# Patient Record
Sex: Male | Born: 1950 | Race: White | Hispanic: No | Marital: Married | Smoking: Never smoker
Health system: Southern US, Community
[De-identification: ages and names within clinical notes are randomized; demographics above are authoritative.]

## PROBLEM LIST (undated history)

## (undated) DIAGNOSIS — N400 Enlarged prostate without lower urinary tract symptoms: Secondary | ICD-10-CM

## (undated) DIAGNOSIS — K5792 Diverticulitis of intestine, part unspecified, without perforation or abscess without bleeding: Secondary | ICD-10-CM

## (undated) DIAGNOSIS — E785 Hyperlipidemia, unspecified: Secondary | ICD-10-CM

## (undated) DIAGNOSIS — I2699 Other pulmonary embolism without acute cor pulmonale: Secondary | ICD-10-CM

## (undated) DIAGNOSIS — F32A Depression, unspecified: Secondary | ICD-10-CM

## (undated) DIAGNOSIS — E039 Hypothyroidism, unspecified: Secondary | ICD-10-CM

## (undated) HISTORY — DX: Diverticulitis of intestine, part unspecified, without perforation or abscess without bleeding: K57.92

## (undated) HISTORY — PX: COLON RESECTION SIGMOID: SHX6737

## (undated) HISTORY — DX: Hyperlipidemia, unspecified: E78.5

---

## 2003-07-16 HISTORY — PX: OTHER SURGICAL HISTORY: SHX169

## 2004-10-18 ENCOUNTER — Encounter: Payer: Self-pay | Admitting: Family Medicine

## 2004-10-18 LAB — CONVERTED CEMR LAB
AST: 17 units/L
HDL: 35 mg/dL
LDL Cholesterol: 117 mg/dL
PSA: 1.6 ng/mL
TSH: 4.34 microintl units/mL

## 2006-10-07 ENCOUNTER — Ambulatory Visit: Payer: Self-pay | Admitting: Family Medicine

## 2006-10-07 DIAGNOSIS — E785 Hyperlipidemia, unspecified: Secondary | ICD-10-CM | POA: Insufficient documentation

## 2006-10-07 DIAGNOSIS — R6882 Decreased libido: Secondary | ICD-10-CM | POA: Insufficient documentation

## 2006-10-07 DIAGNOSIS — L719 Rosacea, unspecified: Secondary | ICD-10-CM | POA: Insufficient documentation

## 2006-10-24 ENCOUNTER — Encounter: Payer: Self-pay | Admitting: Family Medicine

## 2006-10-27 LAB — CONVERTED CEMR LAB
Albumin: 4.3 g/dL (ref 3.5–5.2)
CO2: 25 meq/L (ref 19–32)
Chloride: 104 meq/L (ref 96–112)
Cholesterol: 206 mg/dL — ABNORMAL HIGH (ref 0–200)
Glucose, Bld: 95 mg/dL (ref 70–99)
Potassium: 5 meq/L (ref 3.5–5.3)
Sodium: 142 meq/L (ref 135–145)
Testosterone: 193.77 ng/dL — ABNORMAL LOW (ref 350–890)
Total Protein: 7 g/dL (ref 6.0–8.3)
Triglycerides: 174 mg/dL — ABNORMAL HIGH (ref <150)

## 2006-10-28 ENCOUNTER — Telehealth: Payer: Self-pay | Admitting: Family Medicine

## 2006-11-04 ENCOUNTER — Ambulatory Visit: Payer: Self-pay | Admitting: Family Medicine

## 2006-11-04 DIAGNOSIS — F339 Major depressive disorder, recurrent, unspecified: Secondary | ICD-10-CM | POA: Insufficient documentation

## 2006-11-04 DIAGNOSIS — E291 Testicular hypofunction: Secondary | ICD-10-CM | POA: Insufficient documentation

## 2006-11-04 LAB — CONVERTED CEMR LAB: Hemoglobin: 15.2 g/dL

## 2007-01-06 ENCOUNTER — Ambulatory Visit: Payer: Self-pay | Admitting: Family Medicine

## 2007-02-09 ENCOUNTER — Encounter: Payer: Self-pay | Admitting: Family Medicine

## 2007-02-09 LAB — CONVERTED CEMR LAB
ALT: 21 units/L (ref 0–53)
AST: 19 units/L (ref 0–37)
Cholesterol: 198 mg/dL (ref 0–200)
HDL: 39 mg/dL — ABNORMAL LOW (ref >39)
PSA: 1.94 ng/mL (ref 0.10–4.00)
Sex Hormone Binding: 26 nmol/L (ref 13–71)
Testosterone-% Free: 2.2 % (ref 1.6–2.9)
Testosterone: 255.77 ng/dL — ABNORMAL LOW (ref 350–890)
Total CHOL/HDL Ratio: 5.1
VLDL: 30 mg/dL (ref 0–40)

## 2007-02-11 ENCOUNTER — Telehealth: Payer: Self-pay | Admitting: Family Medicine

## 2007-02-23 ENCOUNTER — Encounter: Payer: Self-pay | Admitting: Family Medicine

## 2007-03-26 ENCOUNTER — Telehealth: Payer: Self-pay | Admitting: Family Medicine

## 2007-03-26 ENCOUNTER — Ambulatory Visit: Payer: Self-pay | Admitting: Family Medicine

## 2007-03-26 DIAGNOSIS — A630 Anogenital (venereal) warts: Secondary | ICD-10-CM | POA: Insufficient documentation

## 2007-06-04 ENCOUNTER — Ambulatory Visit: Payer: Self-pay | Admitting: Family Medicine

## 2007-06-04 DIAGNOSIS — J310 Chronic rhinitis: Secondary | ICD-10-CM | POA: Insufficient documentation

## 2007-06-05 LAB — CONVERTED CEMR LAB
LH: 0.7 milliintl units/mL — ABNORMAL LOW (ref 1.5–9.3)
TSH: 3.027 microintl units/mL (ref 0.350–5.50)
Testosterone: 262.69 ng/dL — ABNORMAL LOW (ref 350–890)

## 2007-07-27 ENCOUNTER — Telehealth (INDEPENDENT_AMBULATORY_CARE_PROVIDER_SITE_OTHER): Payer: Self-pay | Admitting: *Deleted

## 2007-08-03 ENCOUNTER — Telehealth: Payer: Self-pay | Admitting: Family Medicine

## 2007-08-03 ENCOUNTER — Encounter: Payer: Self-pay | Admitting: Family Medicine

## 2007-08-04 ENCOUNTER — Encounter: Payer: Self-pay | Admitting: Family Medicine

## 2007-08-10 ENCOUNTER — Telehealth: Payer: Self-pay | Admitting: Family Medicine

## 2007-08-27 ENCOUNTER — Encounter: Payer: Self-pay | Admitting: Family Medicine

## 2007-08-27 ENCOUNTER — Telehealth: Payer: Self-pay | Admitting: Family Medicine

## 2007-09-25 ENCOUNTER — Encounter: Payer: Self-pay | Admitting: Family Medicine

## 2008-01-01 ENCOUNTER — Telehealth: Payer: Self-pay | Admitting: Family Medicine

## 2008-01-06 ENCOUNTER — Telehealth: Payer: Self-pay | Admitting: Family Medicine

## 2008-01-08 ENCOUNTER — Encounter: Payer: Self-pay | Admitting: Family Medicine

## 2008-01-12 LAB — CONVERTED CEMR LAB
Testosterone-% Free: 3.4 % — ABNORMAL HIGH (ref 1.6–2.9)
Testosterone: 2854.3 ng/dL — ABNORMAL HIGH (ref 350–890)

## 2008-01-21 ENCOUNTER — Telehealth: Payer: Self-pay | Admitting: Family Medicine

## 2008-05-09 ENCOUNTER — Ambulatory Visit: Payer: Self-pay | Admitting: Family Medicine

## 2008-05-09 DIAGNOSIS — L03818 Cellulitis of other sites: Secondary | ICD-10-CM

## 2008-05-09 DIAGNOSIS — L02818 Cutaneous abscess of other sites: Secondary | ICD-10-CM | POA: Insufficient documentation

## 2008-05-09 DIAGNOSIS — J069 Acute upper respiratory infection, unspecified: Secondary | ICD-10-CM | POA: Insufficient documentation

## 2008-05-11 ENCOUNTER — Encounter: Payer: Self-pay | Admitting: Family Medicine

## 2008-05-12 ENCOUNTER — Encounter: Payer: Self-pay | Admitting: Family Medicine

## 2008-05-19 LAB — CONVERTED CEMR LAB
AST: 23 units/L (ref 0–37)
CO2: 25 meq/L (ref 19–32)
Chloride: 102 meq/L (ref 96–112)
Creatinine, Ser: 0.98 mg/dL (ref 0.40–1.50)
HDL: 47 mg/dL (ref >39)
LDL Cholesterol: 141 mg/dL — ABNORMAL HIGH (ref 0–99)
Potassium: 4.8 meq/L (ref 3.5–5.3)
Testosterone-% Free: 2.3 % (ref 1.6–2.9)
Total CHOL/HDL Ratio: 4.6
Triglycerides: 142 mg/dL (ref <150)
VLDL: 28 mg/dL (ref 0–40)

## 2008-10-20 ENCOUNTER — Telehealth: Payer: Self-pay | Admitting: Family Medicine

## 2008-10-21 ENCOUNTER — Encounter: Payer: Self-pay | Admitting: Family Medicine

## 2008-10-24 ENCOUNTER — Telehealth: Payer: Self-pay | Admitting: Family Medicine

## 2008-10-25 LAB — CONVERTED CEMR LAB
AST: 20 units/L (ref 0–37)
HCT: 47.1 % (ref 39.0–52.0)
PSA, Free Pct: 19 — ABNORMAL LOW (ref >25)
PSA, Free: 1.1 ng/mL
PSA: 5.89 ng/mL — ABNORMAL HIGH (ref 0.10–4.00)
Testosterone Free: 78.5 pg/mL (ref 47.0–244.0)
Testosterone-% Free: 2.3 % (ref 1.6–2.9)

## 2008-11-02 ENCOUNTER — Encounter: Payer: Self-pay | Admitting: Family Medicine

## 2009-01-06 ENCOUNTER — Encounter: Payer: Self-pay | Admitting: Family Medicine

## 2009-04-10 ENCOUNTER — Ambulatory Visit: Payer: Self-pay | Admitting: Family Medicine

## 2009-04-10 DIAGNOSIS — R972 Elevated prostate specific antigen [PSA]: Secondary | ICD-10-CM | POA: Insufficient documentation

## 2009-04-11 ENCOUNTER — Encounter: Payer: Self-pay | Admitting: Family Medicine

## 2009-04-11 LAB — CONVERTED CEMR LAB
AST: 22 units/L (ref 0–37)
Albumin: 4.1 g/dL (ref 3.5–5.2)
Alkaline Phosphatase: 71 units/L (ref 39–117)
BUN: 14 mg/dL (ref 6–23)
Calcium: 9 mg/dL (ref 8.4–10.5)
Chloride: 106 meq/L (ref 96–112)
Glucose, Bld: 121 mg/dL — ABNORMAL HIGH (ref 70–99)
Hemoglobin: 14.7 g/dL (ref 13.0–17.0)
MCHC: 33.1 g/dL (ref 30.0–36.0)
PSA, Free Pct: 19 — ABNORMAL LOW (ref >25)
PSA, Free: 0.6 ng/mL
PSA: 3.12 ng/mL (ref 0.10–4.00)
Potassium: 4.2 meq/L (ref 3.5–5.3)
RBC: 4.99 M/uL (ref 4.22–5.81)
Sodium: 143 meq/L (ref 135–145)
Total Protein: 6.5 g/dL (ref 6.0–8.3)
WBC: 6.4 10*3/uL (ref 4.0–10.5)

## 2009-04-18 ENCOUNTER — Encounter: Payer: Self-pay | Admitting: Family Medicine

## 2009-06-05 ENCOUNTER — Encounter: Payer: Self-pay | Admitting: Family Medicine

## 2009-06-06 ENCOUNTER — Encounter: Payer: Self-pay | Admitting: Family Medicine

## 2009-06-15 LAB — CONVERTED CEMR LAB
Albumin: 4.1 g/dL (ref 3.5–5.2)
Bilirubin, Direct: 0.1 mg/dL (ref 0.0–0.3)
HCT: 45 % (ref 39.0–52.0)
PSA, Free: 0.6 ng/mL
PSA: 2.45 ng/mL (ref 0.10–4.00)
Sex Hormone Binding: 23 nmol/L (ref 13–71)
Testosterone: 268.6 ng/dL — ABNORMAL LOW (ref 350–890)
Total Bilirubin: 0.6 mg/dL (ref 0.3–1.2)

## 2009-07-13 ENCOUNTER — Ambulatory Visit: Payer: Self-pay | Admitting: Family Medicine

## 2009-07-13 DIAGNOSIS — L03019 Cellulitis of unspecified finger: Secondary | ICD-10-CM | POA: Insufficient documentation

## 2009-07-13 DIAGNOSIS — R21 Rash and other nonspecific skin eruption: Secondary | ICD-10-CM | POA: Insufficient documentation

## 2009-07-24 ENCOUNTER — Ambulatory Visit: Payer: Self-pay | Admitting: Family Medicine

## 2009-07-24 DIAGNOSIS — R7301 Impaired fasting glucose: Secondary | ICD-10-CM | POA: Insufficient documentation

## 2009-07-24 LAB — CONVERTED CEMR LAB: Blood Glucose, AC Bkfst: 106 mg/dL

## 2009-08-31 ENCOUNTER — Telehealth: Payer: Self-pay | Admitting: Family Medicine

## 2009-10-04 ENCOUNTER — Telehealth: Payer: Self-pay | Admitting: Family Medicine

## 2009-10-04 DIAGNOSIS — I868 Varicose veins of other specified sites: Secondary | ICD-10-CM | POA: Insufficient documentation

## 2009-11-06 ENCOUNTER — Encounter: Payer: Self-pay | Admitting: Family Medicine

## 2009-11-28 ENCOUNTER — Ambulatory Visit: Payer: Self-pay | Admitting: Family Medicine

## 2009-12-28 ENCOUNTER — Encounter: Payer: Self-pay | Admitting: Family Medicine

## 2010-02-21 ENCOUNTER — Encounter: Payer: Self-pay | Admitting: Family Medicine

## 2010-02-22 LAB — CONVERTED CEMR LAB
HDL: 35 mg/dL — ABNORMAL LOW (ref >39)
Total CHOL/HDL Ratio: 6
Triglycerides: 226 mg/dL — ABNORMAL HIGH (ref <150)

## 2010-03-02 ENCOUNTER — Telehealth: Payer: Self-pay | Admitting: Family Medicine

## 2010-03-14 ENCOUNTER — Ambulatory Visit: Payer: Self-pay | Admitting: Family Medicine

## 2010-03-14 DIAGNOSIS — R197 Diarrhea, unspecified: Secondary | ICD-10-CM | POA: Insufficient documentation

## 2010-03-14 DIAGNOSIS — R1084 Generalized abdominal pain: Secondary | ICD-10-CM | POA: Insufficient documentation

## 2010-03-15 ENCOUNTER — Encounter: Admission: RE | Admit: 2010-03-15 | Discharge: 2010-03-15 | Payer: Self-pay | Admitting: Family Medicine

## 2010-03-15 LAB — CONVERTED CEMR LAB
ALT: 27 units/L (ref 0–53)
AST: 25 units/L (ref 0–37)
Alkaline Phosphatase: 69 units/L (ref 39–117)
Basophils Absolute: 0 10*3/uL (ref 0.0–0.1)
Basophils Relative: 0 % (ref 0–1)
CO2: 29 meq/L (ref 19–32)
Creatinine, Ser: 1.01 mg/dL (ref 0.40–1.50)
Eosinophils Absolute: 0.1 10*3/uL (ref 0.0–0.7)
Eosinophils Relative: 1 % (ref 0–5)
HCT: 50.4 % (ref 39.0–52.0)
Hemoglobin: 16.4 g/dL (ref 13.0–17.0)
LDH: 177 units/L (ref 94–250)
MCHC: 32.5 g/dL (ref 30.0–36.0)
MCV: 91.3 fL (ref 78.0–100.0)
Monocytes Absolute: 0.7 10*3/uL (ref 0.1–1.0)
Platelets: 258 10*3/uL (ref 150–400)
RDW: 13.8 % (ref 11.5–15.5)
Total Bilirubin: 0.6 mg/dL (ref 0.3–1.2)

## 2010-03-21 ENCOUNTER — Encounter: Payer: Self-pay | Admitting: Family Medicine

## 2010-03-29 ENCOUNTER — Encounter: Payer: Self-pay | Admitting: Family Medicine

## 2010-05-16 ENCOUNTER — Encounter: Payer: Self-pay | Admitting: Family Medicine

## 2010-05-31 ENCOUNTER — Ambulatory Visit: Payer: Self-pay | Admitting: Psychology

## 2010-07-25 ENCOUNTER — Ambulatory Visit: Admit: 2010-07-25 | Payer: Self-pay | Admitting: Psychology

## 2010-08-14 NOTE — Assessment & Plan Note (Signed)
Summary: f/u depression   Vital Signs:  Patient profile:   60 year old male Height:      74 inches Weight:      232 pounds BMI:     29.89 O2 Sat:      99 % on Room air Pulse rate:   85 / minute BP sitting:   110 / 76  (left arm) Cuff size:   small  Vitals Entered By: Marc Lowe CMA (Nov 28, 2009 8:11 AM)  O2 Flow:  Room air CC: F/U mood. Doing well.   Primary Care Provider:  Seymour Lowe D.O.  CC:  F/U mood. Doing well.[name] Kitchen  History of Present Illness: [name] presents for f/u depression, on Effexor XR 150 mg/ day.  Going thru a lot of stressors at home with 60 elderly father's failing health and his twin daughters moving back home.  He is off his testosterone b/c it 'wasn't helping' his symptoms.    Denies insomnia, panic attacks or suicidal thoughts.  His wife is supportive.  He wants to see a therapist.  He feels more tired than usual.  His Effexor has helped some and he wants to stay on it.    Current Medications (verified): 1)  Effexor Xr 150 Mg Xr24h-Cap (Venlafaxine Hcl) .... Take 1 Tablet By Mouth Once A Day 2)  Testosterone 25% Cream .... Apply 1/2 Ml To Inner Thighs Twice Daily 3)  Tetracycline Hcl 500 Mg Caps (Tetracycline Hcl) .[name] Kitchen.. 1 Capsule By Mouth Daily W/ Food  Allergies (verified): No Known Drug Allergies  Past History:  Past Medical History: diverticulitiis- perf lead to colon resection in 2005 acne rosace hx of cellulitis of the abdominal wall hypogonadism depression  Past Surgical History: Reviewed history from 10/07/2006 and no changes required. achilles tendon repair, left lasik surgery colon resection 2005 (w/ E. Coli infection) colosctomy 2006 with reversal ventral hernia repair of incision, 2007 x 2 uvuloplasty  Social History: Reviewed history from 10/07/2006 and no changes required. 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  Has 4 grown kids, youngest at home.  Never smoked.  Walks 2 x a wk.  Had vasectomy.  2 cups coffee  daily.  2 ETOH drinks per wk.    Review of Systems Psych:  Complains of depression; denies anxiety, irritability, panic attacks, suicidal thoughts/plans, thoughts of violence, unusual visions or sounds, and thoughts /plans of harming others.  Physical Exam  General:  alert, well-developed, well-nourished, and well-hydrated.   Head:  normocephalic and atraumatic.   Mouth:  good dentition and pharynx pink and moist.   Neck:  no masses.   Lungs:  Normal respiratory effort, chest expands symmetrically. Lungs are clear to auscultation, no crackles or wheezes. Heart:  Normal rate and regular rhythm. S1 and S2 normal without gallop, murmur, click, rub or other extra sounds. Extremities:  varicose veins in legs Skin:  color normal.   Cervical Nodes:  No lymphadenopathy noted Psych:  good eye contact, not anxious appearing, and flat affect.     Impression & Recommendations:  Problem # 1:  DISORDER, DEPRESSIVE NEC (ICD-311) He has many life stressors now and has done fair with Effexor.  He is not having panic attacks, insomnia, suicidial thoughts to warrant adding a mood stabilizer or benzos.  I will add a counseling referral with Dr Dellia Cloud.   His updated medication list for this problem includes:    Effexor Xr 150 Mg Xr24h-cap (Venlafaxine hcl) .[name] Kitchen... Take 1 tablet by mouth once a day  Orders: Psychology Referral (Psychology)  Problem # 2:  HYPOGONADISM, MALE (ICD-257.2) He is off testosterone replacement since he was not seeing clinical improvement, which may be more to his depression. I will get him into the psychologist. Orders: Psychology Referral (Psychology)  Problem # 3:  HYPERLIPIDEMIA (ICD-272.4) Update FLP in June.  He has just started the 'belly fat' diet and wants to try this for a month. 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 He has f/u with them for laser treatment.  Complete Medication List: 1)  Effexor Xr 150 Mg Xr24h-cap (Venlafaxine hcl) .... Take 1 tablet by mouth once a day 2)  Tetracycline Hcl 500 Mg Caps (Tetracycline hcl) .[name] Kitchen.. 1 capsule by mouth daily w/ food  Other Orders: T-Glucose, Blood ([id number])  Patient Instructions: 1)  Update fasting labs in June. 2)  Will call you w/ results. 3)  Stay on Effexor. 4)  Referral made to psychologist - Dr Dellia Cloud. 5)  REturn for a PHYSICAL in 4 mos. Prescriptions: EFFEXOR XR 150 MG XR24H-CAP (VENLAFAXINE HCL) Take 1 tablet by mouth once a day  #30 x 6   Entered and Authorized by:   Marc Bars DO   Signed by:   Marc Bars DO on 11/28/2009   Method used:   Electronically to        [facility]  [id number] (retail)       [address]       [place], [place]  [address]       Ph: [phone]       Fax: [phone]   RxID:   [id number]

## 2010-08-14 NOTE — Letter (Signed)
Summary: [facility]  [facility]   Imported By: Lanelle Bal 05/30/2010 12:14:20  _____________________________________________________________________  External Attachment:    Type:   Image     Comment:   External Document

## 2010-08-14 NOTE — Letter (Signed)
Summary: [facility]  [facility]   Imported By: Lanelle Bal 01/12/2010 10:11:29  _____________________________________________________________________  External Attachment:    Type:   Image     Comment:   External Document

## 2010-08-14 NOTE — Progress Notes (Signed)
Summary: Finger no better  Phone Note Call from Patient Call back at Home Phone [phone]   Caller: Patient Call For: Seymour Bars DO Summary of Call: pt calls and states his finger is no better and wonders what to do. Please advise Initial call taken by: Kathlene November,  August 31, 2009 11:00 AM  Follow-up for Phone Call        Lets see if we can get him in with [facility] in [place] today or tomorrow for finger abscess. Follow-up by: Seymour Bars DO,  August 31, 2009 11:21 AM     Appended Document: Finger no better   Appended Document: Finger no better pt aware

## 2010-08-14 NOTE — Consult Note (Signed)
Summary: [facility]  [facility]   Imported By: Lanelle Bal 03/29/2010 11:50:32  _____________________________________________________________________  External Attachment:    Type:   Image     Comment:   External Document

## 2010-08-14 NOTE — Assessment & Plan Note (Signed)
Summary: abdominal pain   Vital Signs:  Patient profile:   60 year old male Height:      74 inches Weight:      232 pounds BMI:     29.89 O2 Sat:      99 % on Room air Temp:     98.5 degrees F oral Pulse rate:   85 / minute BP sitting:   108 / 71  (left arm) Cuff size:   large  Vitals Entered By: Payton Spark CMA (March 14, 2010 11:20 AM)  O2 Flow:  Room air CC: Stomach ache x 2 days. Has had episodes like this in the past.    Primary Care Provider:  Seymour Bars D.O.  CC:  Stomach ache x 2 days. Has had episodes like this in the past. .  History of Present Illness: [name] presents for diffuse abdominal pain that began about a wk ago.  He feels bloated.  Denies any sharp pain.  His abd pain woke him up from sleep.  Took Pepto in the middle  of the night.    Over the past month, he has had more abdominal pain that usual.  He took Crestor for the first time a wk ago and had N/V for 1 night.  Denies any constipation.  He is getting loose stools.  Denies blood in the stool.  Pain is usually worse after eating.  He has been eating healthy and smaller portions.    He has a hx of colon resection in 05 for diverticulitis.  He had his last colonoscopy in 06 and is due for f/u.       Current Medications (verified): 1)  Effexor Xr 150 Mg Xr24h-Cap (Venlafaxine Hcl) .... Take 1 Tablet By Mouth Once A Day 2)  Tetracycline Hcl 500 Mg Caps (Tetracycline Hcl) .[name] Kitchen.. 1 Capsule By Mouth Daily W/ Food 3)  Zetia 10 Mg Tabs (Ezetimibe) .[name] Kitchen.. 1 Tab By Mouth Daily  Allergies (verified): 1)  ! Crestor (Rosuvastatin Calcium)  Past History:  Past Medical History: Reviewed history from 11/28/2009 and no changes required. diverticulitiis- perf lead to colon resection in 2005 acne rosace hx of cellulitis of the abdominal wall hypogonadism depression  Past Surgical History: Reviewed history from 10/07/2006 and no changes required. achilles tendon repair, left lasik surgery colon resection  2005 (w/ E. Coli infection) colosctomy 2006 with reversal ventral hernia repair of incision, 2007 x 2 uvuloplasty  Social History: Reviewed history from 10/07/2006 and no changes required. 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  Has 4 grown kids, youngest at home.  Never smoked.  Walks 2 x a wk.  Had vasectomy.  2 cups coffee daily.  2 ETOH drinks per wk.    Review of Systems General:  Complains of loss of appetite; denies fatigue, fever, weakness, and weight loss. CV:  Denies chest pain or discomfort. GI:  Complains of abdominal pain, change in bowel habits, diarrhea, gas, indigestion, and nausea; denies bloody stools, constipation, dark tarry stools, and vomiting.  Physical Exam  General:  alert, well-developed, well-nourished, and well-hydrated.   Head:  normocephalic and atraumatic.   Eyes:  sclera non icteric Mouth:  pharynx pink and moist.   Neck:  no masses.   Lungs:  Normal respiratory effort, chest expands symmetrically. Lungs are clear to auscultation, no crackles or wheezes. Heart:  Normal rate and regular rhythm. S1 and S2 normal without gallop, murmur, click, rub or other extra sounds. Abdomen:  diffusely TTP with mild  distenstion.  NO HSM.  No rigidity.  hyperactive BS. Extremities:  no LE edema Skin:  no rash, jaundice or pallor Psych:  good eye contact, not anxious appearing, and not depressed appearing.     Impression & Recommendations:  Problem # 1:  ABDOMINAL PAIN, GENERALIZED (ICD-789.07) Will get labs and a CT scan today.  Concern given hx of partial colectomy for obstruction.  F/U result tomorrow.  Clear liquid diet.  Phenergan with Codeine as needed symptom releif.   Orders: T-CT Abdomen/pelvis w ([id number]) T-CBC w/Diff [phone]) T-Comprehensive Metabolic Panel [phone]) T-Amylase [id number]) T-Lipase [id number]) T-LDH [phone]) Gastroenterology Referral (GI)  Problem # 2:  DIARRHEA (ICD-787.91) As per above.  5 yrs since last  colonoscopy.  Will need repeat.   Orders: T-CT Abdomen/pelvis w ([id number])  Complete Medication List: 1)  Effexor Xr 150 Mg Xr24h-cap (Venlafaxine hcl) .... Take 1 tablet by mouth once a day 2)  Tetracycline Hcl 500 Mg Caps (Tetracycline hcl) .[name] Kitchen.. 1 capsule by mouth daily w/ food 3)  Zetia 10 Mg Tabs (Ezetimibe) .[name] Kitchen.. 1 tab by mouth daily 4)  Promethazine-codeine 6.25-10 Mg/30ml Syrp (Promethazine-codeine) .... 5 ml by mouth at bedtime as needed nausea/ abd pain  Patient Instructions: 1)  CT today. 2)  Labs today. 3)  Will call you with all results tomorrow. 4)  Clear liquid diet today. 5)  Will get you in with GI. 6)  Use Phenergan with codeine as needed for pain/ nausea-- caution as this will cause sedation and constipation (you can take every 6 hrs if at home and not needing to drive) Prescriptions: PROMETHAZINE-CODEINE 6.25-10 MG/5ML SYRP (PROMETHAZINE-CODEINE) 5 ml by mouth at bedtime as needed nausea/ abd pain  #120 ml x 0   Entered and Authorized by:   Seymour Bars DO   Signed by:   Seymour Bars DO on 03/14/2010   Method used:   Printed then faxed to ...       [facility]  [address] [phone]* (retail)       [address]       [place], [place]  [address]       Ph: [phone]       Fax: [phone]   RxID:   [id number]

## 2010-08-14 NOTE — Progress Notes (Signed)
Summary: Referral  Phone Note Call from Patient   Caller: Patient Summary of Call: Pt would like referral to vein specialist as discussed at last OV. Please advise. Initial call taken by: Payton Spark CMA,  October 04, 2009 4:21 PM  Follow-up for Phone Call        for varicose veins? Follow-up by: Seymour Bars DO,  October 04, 2009 4:27 PM  Additional Follow-up for Phone Call Additional follow up Details #1::        Yes. He said for ? vein removal. 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  Seymour Bars, D.O.

## 2010-08-14 NOTE — Letter (Signed)
Summary: Letter with Liver MRI Results/[facility]  Letter with Liver MRI Results/[facility]   Imported By: Lanelle Bal 04/10/2010 11:18:21  _____________________________________________________________________  External Attachment:    Type:   Image     Comment:   External Document

## 2010-08-14 NOTE — Consult Note (Signed)
Summary: [facility]  [facility]   Imported By: Lanelle Bal 11/23/2009 12:54:25  _____________________________________________________________________  External Attachment:    Type:   Image     Comment:   External Document

## 2010-08-14 NOTE — Progress Notes (Signed)
Summary: Crestor SE  Phone Note Call from Patient   Caller: Patient Summary of Call: Pt states he has tried the Crestor but it caused vomiting and diarrhea. Pt would like to have something different called in.  Initial call taken by: Payton Spark CMA,  March 02, 2010 1:00 PM  Follow-up for Phone Call        I changed him to a different class of cholesterol meds - zetia, once a day.  stop crestor.  Call if any problems.  recheck FLP in 8 wks. Follow-up by: Seymour Bars DO,  March 02, 2010 1:22 PM   New Allergies: ! CRESTOR (ROSUVASTATIN CALCIUM) New/Updated Medications: ZETIA 10 MG TABS (EZETIMIBE) 1 tab by mouth daily New Allergies: ! CRESTOR (ROSUVASTATIN CALCIUM)Prescriptions: ZETIA 10 MG TABS (EZETIMIBE) 1 tab by mouth daily  #30 x 2   Entered and Authorized by:   Seymour Bars DO   Signed by:   Seymour Bars DO on 03/02/2010   Method used:   Electronically to        [facility]  [id number] (retail)       [address]       [place], [place]  [address]       Ph: [phone]       Fax: [phone]   RxID:   [id number]   Appended Document: Crestor SE Pt aware of the above

## 2010-08-14 NOTE — Assessment & Plan Note (Signed)
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 Keep working on [employer], regular exercise.  Monitor q  6 mos. Orders: Fingerstick (36416) Glucose, (CBG) (60454)  Complete Medication List: 1)  Effexor Xr 150 Mg Xr24h-cap (Venlafaxine hcl) .... Take 1 tablet by mouth once a day 2)  Testosterone 25% Cream  .... Apply 1/2 ml to inner thighs twice daily 3)  Tetracycline Hcl 500 Mg Caps (Tetracycline hcl) .[name] Kitchen.. 1 capsule by mouth daily w/ food 4)  Triamcinolone Acetonide 0.5 % Oint (Triamcinolone acetonide) .... Apply ointment to the r food two times a day x 10 days 5)  Cephalexin 500 Mg Caps (Cephalexin) .[name] Kitchen.. 1 capsule by mouth three times a day x 7 days

## 2010-08-16 ENCOUNTER — Encounter: Payer: Self-pay | Admitting: Family Medicine

## 2010-09-11 NOTE — Letter (Signed)
Summary: [facility]  [facility]   Imported By: Maryln Gottron 09/03/2010 15:09:35  _____________________________________________________________________  External Attachment:    Type:   Image     Comment:   External Document

## 2010-10-29 ENCOUNTER — Telehealth: Payer: Self-pay | Admitting: Family Medicine

## 2010-10-29 MED ORDER — EZETIMIBE 10 MG PO TABS: 10.0000 mg | ORAL_TABLET | Freq: Every day | ORAL | Status: DC

## 2010-10-29 MED ORDER — VENLAFAXINE HCL ER 150 MG PO CP24: 150.0000 mg | ORAL_CAPSULE | Freq: Every day | ORAL | Status: DC

## 2010-10-29 NOTE — Telephone Encounter (Signed)
Needs refill on Rx.  Please call him at [phone]

## 2011-02-12 ENCOUNTER — Other Ambulatory Visit: Payer: Self-pay | Admitting: Family Medicine

## 2012-11-13 ENCOUNTER — Ambulatory Visit: Payer: Self-pay | Admitting: Family Medicine

## 2014-04-14 ENCOUNTER — Encounter: Payer: Self-pay | Admitting: Family Medicine

## 2014-04-14 ENCOUNTER — Ambulatory Visit (INDEPENDENT_AMBULATORY_CARE_PROVIDER_SITE_OTHER): Payer: BC Managed Care – PPO | Admitting: Family Medicine

## 2014-04-14 VITALS — BP 122/81 | HR 75 | Ht 74.0 in | Wt 230.0 lb

## 2014-04-14 DIAGNOSIS — Z23 Encounter for immunization: Secondary | ICD-10-CM

## 2014-04-14 DIAGNOSIS — Z0000 Encounter for general adult medical examination without abnormal findings: Secondary | ICD-10-CM

## 2014-04-14 DIAGNOSIS — Z9049 Acquired absence of other specified parts of digestive tract: Secondary | ICD-10-CM | POA: Insufficient documentation

## 2014-04-14 DIAGNOSIS — E291 Testicular hypofunction: Secondary | ICD-10-CM | POA: Diagnosis not present

## 2014-04-14 DIAGNOSIS — R972 Elevated prostate specific antigen [PSA]: Secondary | ICD-10-CM

## 2014-04-14 DIAGNOSIS — E349 Endocrine disorder, unspecified: Secondary | ICD-10-CM | POA: Insufficient documentation

## 2014-04-14 DIAGNOSIS — Z9289 Personal history of other medical treatment: Secondary | ICD-10-CM | POA: Diagnosis not present

## 2014-04-14 LAB — COMPLETE METABOLIC PANEL WITH GFR
ALBUMIN: 4.3 g/dL (ref 3.5–5.2)
ALK PHOS: 59 U/L (ref 39–117)
ALT: 21 U/L (ref 0–53)
AST: 18 U/L (ref 0–37)
BILIRUBIN TOTAL: 0.8 mg/dL (ref 0.2–1.2)
BUN: 13 mg/dL (ref 6–23)
CO2: 28 mEq/L (ref 19–32)
Calcium: 9.5 mg/dL (ref 8.4–10.5)
Chloride: 104 mEq/L (ref 96–112)
Creat: 1.03 mg/dL (ref 0.50–1.35)
GFR, EST NON AFRICAN AMERICAN: 78 mL/min
GFR, Est African American: >89 mL/min
GLUCOSE: 90 mg/dL (ref 70–99)
POTASSIUM: 4.6 meq/L (ref 3.5–5.3)
SODIUM: 138 meq/L (ref 135–145)
TOTAL PROTEIN: 6.7 g/dL (ref 6.0–8.3)

## 2014-04-14 LAB — CBC
HEMATOCRIT: 50.1 % (ref 39.0–52.0)
HEMOGLOBIN: 17.3 g/dL — AB (ref 13.0–17.0)
MCH: 29.7 pg (ref 26.0–34.0)
MCHC: 34.5 g/dL (ref 30.0–36.0)
MCV: 86.1 fL (ref 78.0–100.0)
Platelets: 238 10*3/uL (ref 150–400)
RBC: 5.82 MIL/uL — ABNORMAL HIGH (ref 4.22–5.81)
RDW: 13.8 % (ref 11.5–15.5)
WBC: 5.8 10*3/uL (ref 4.0–10.5)

## 2014-04-14 LAB — LIPID PANEL
CHOL/HDL RATIO: 5.8 ratio
Cholesterol: 226 mg/dL — ABNORMAL HIGH (ref 0–200)
HDL: 39 mg/dL — ABNORMAL LOW (ref >39)
LDL CALC: 146 mg/dL — AB (ref 0–99)
Triglycerides: 207 mg/dL — ABNORMAL HIGH (ref <150)
VLDL: 41 mg/dL — ABNORMAL HIGH (ref 0–40)

## 2014-04-14 MED ORDER — AMBULATORY NON FORMULARY MEDICATION: Status: DC

## 2014-04-14 NOTE — Progress Notes (Signed)
CC: Marc Lowe is a 63 y.o. male is here for Establish Care   Subjective: HPI:  Colonoscopy: Unsure about date but believes it was in the last 3-5 years. I've asked him to provide me with the name of the clinic that most recently provided him with a colonoscopy to determine if he needs a repeat for colon cancer screening purposes Prostate: Discussed screening risks/beneifts with patient today, he has a history of elevated PSA but he is not sure just how high this was. He's currently on testosterone therapy and has a urologist to follow as his BPH and elevated PSA  Influenza Vaccine: Declined today Pneumovax: No current indication Td/Tdap: Needs Tdap today Zoster: Will receive today  Pleasant 63 year old here to reestablish care with no acute complaints  Review of Systems - General ROS: negative for - chills, fever, night sweats, weight gain or weight loss Ophthalmic ROS: negative for - decreased vision Psychological ROS: negative for - anxiety or depression ENT ROS: negative for - hearing change, nasal congestion, tinnitus or allergies Hematological and Lymphatic ROS: negative for - bleeding problems, bruising or swollen lymph nodes Breast ROS: negative Respiratory ROS: no cough, shortness of breath, or wheezing Cardiovascular ROS: no chest pain or dyspnea on exertion Gastrointestinal ROS: no abdominal pain, change in bowel habits, or black or bloody stools Genito-Urinary ROS: negative for - genital discharge, genital ulcers, incontinence or abnormal bleeding from genitals Musculoskeletal ROS: negative for - joint pain or muscle pain Neurological ROS: negative for - headaches or memory loss Dermatological ROS: negative for lumps, mole changes, rash and skin lesion changes  Past Medical History  Diagnosis Date  . Diverticulitis     History reviewed. No pertinent past surgical history. History reviewed. No pertinent family history.  History   Social History  . Marital  Status: Married    Spouse Name: N/A    Number of Children: N/A  . Years of Education: N/A   Occupational History  . Not on file.   Social History Main Topics  . Smoking status: Never Smoker   . Smokeless tobacco: Not on file  . Alcohol Use: Not on file  . Drug Use: No  . Sexual Activity: Yes    Partners: Female   Other Topics Concern  . Not on file   Social History Narrative  . No narrative on file     Objective: BP 122/81  Pulse 75  Ht 6\' 2"  (1.88 m)  Wt 230 lb (104.327 kg)  BMI 29.52 kg/m2  General: No Acute Distress HEENT: Atraumatic, normocephalic, conjunctivae normal without scleral icterus.  No nasal discharge, hearing grossly intact, TMs with good landmarks bilaterally with no middle ear abnormalities, posterior pharynx clear without oral lesions. Neck: Supple, trachea midline, no cervical nor supraclavicular adenopathy. Pulmonary: Clear to auscultation bilaterally without wheezing, rhonchi, nor rales. Cardiac: Regular rate and rhythm.  No murmurs, rubs, nor gallops. No peripheral edema.  2+ peripheral pulses bilaterally. Abdomen: Bowel sounds normal.  No masses.  Non-tender without rebound.  Negative Murphy's sign. MSK: Grossly intact, no signs of weakness.  Full strength throughout upper and lower extremities.  Full ROM in upper and lower extremities.  No midline spinal tenderness. Neuro: Gait unremarkable, CN II-XII grossly intact.  C5-C6 Reflex 2/4 Bilaterally, L4 Reflex 2/4 Bilaterally.  Cerebellar function intact. Skin: No rashes. Psych: Alert and oriented to person/place/time.  Thought process normal. No anxiety/depression.  Assessment & Plan: Marc Lowe was seen today for establish care.  Diagnoses and associated orders for this  visit:  Annual physical exam - Lipid panel - COMPLETE METABOLIC PANEL WITH GFR - CBC - Tdap vaccine greater than or equal to 7yo IM - Varicella-zoster vaccine subcutaneous  History of bowel resection  Testosterone  deficiency  ELEVATED PROSTATE SPECIFIC ANTIGEN  Other Orders - AMBULATORY NON FORMULARY MEDICATION; Topical Testosterone    Healthy lifestyle interventions including but not limited to regular exercise, a healthy low fat diet, moderation of salt intake, the dangers of tobacco/alcohol/recreational drug use, nutrition supplementation, and accident avoidance were discussed with the patient and a handout was provided for future reference.  Followup will be based on the results of the above results   Return if symptoms worsen or fail to improve.

## 2014-04-14 NOTE — Patient Instructions (Addendum)
Please let me know where you had your most recent colonoscopy so I can request your old records.     Dr. Lajoyce Lauber General Advice Following Your Complete Physical Exam  The Benefits of Regular Exercise: Unless you suffer from an uncontrolled cardiovascular condition, studies strongly suggest that regular exercise and physical activity will add to both the quality and length of your life.  The World Health Organization recommends 150 minutes of moderate intensity aerobic activity every week.  This is best split over 3-4 days a week, and can be as simple as a brisk walk for just over 35 minutes "most days of the week".  This type of exercise has been shown to lower LDL-Cholesterol, lower average blood sugars, lower blood pressure, lower cardiovascular disease risk, improve memory, and increase one's overall sense of wellbeing.  The addition of anaerobic (or "strength training") exercises offers additional benefits including but not limited to increased metabolism, prevention of osteoporosis, and improved overall cholesterol levels.  How Can I Strive For A Low-Fat Diet?: Current guidelines recommend that 25-35 percent of your daily energy (food) intake should come from fats.  One might ask how can this be achieved without having to dissect each meal on a daily basis?  Switch to skim or 1% milk instead of whole milk.  Focus on lean meats such as ground [place], fresh fish, baked chicken, and lean cuts of beef as your source of dietary protein.  Limit saturated fat consumption to less than 10% of your daily caloric intake.  Limit trans fatty acid consumption primarily by limiting synthetic trans fats such as partially hydrogenated oils (Ex: fried fast foods).  Substitute olive or vegetable oil for solid fats where possible.  Moderation of Salt Intake: Provided you don't carry a diagnosis of congestive heart failure nor renal failure, I recommend a daily allowance of no more than 2300 mg of salt  (sodium).  Keeping under this daily goal is associated with a decreased risk of cardiovascular events, creeping above it can lead to elevated blood pressures and increases your risk of cardiovascular events.  Milligrams (mg) of salt is listed on all nutrition labels, and your daily intake can add up faster than you think.  Most canned and frozen dinners can pack in over half your daily salt allowance in one meal.    Lifestyle Health Risks: Certain lifestyle choices carry specific health risks.  As you may already know, tobacco use has been associated with increasing one's risk of cardiovascular disease, pulmonary disease, numerous cancers, among many other issues.  What you may not know is that there are medications and nicotine replacement strategies that can more than double your chances of successfully quitting.  I would be thrilled to help manage your quitting strategy if you currently use tobacco products.  When it comes to alcohol use, I've yet to find an "ideal" daily allowance.  Provided an individual does not have a medical condition that is exacerbated by alcohol consumption, general guidelines determine "safe drinking" as no more than two standard drinks for a man or no more than one standard drink for a male per day.  However, much debate still exists on whether any amount of alcohol consumption is technically "safe".  My general advice, keep alcohol consumption to a minimum for general health promotion.  If you or others believe that alcohol, tobacco, or recreational drug use is interfering with your life, I would be happy to provide confidential counseling regarding treatment options.  General "Over The Counter" Nutrition  Advice: Postmenopausal women should aim for a daily calcium intake of 1200 mg, however a significant portion of this might already be provided by diets including milk, yogurt, cheese, and other dairy products.  Vitamin D has been shown to help preserve bone density, prevent  fatigue, and has even been shown to help reduce falls in the elderly.  Ensuring a daily intake of 800 Units of Vitamin D is a good place to start to enjoy the above benefits, we can easily check your Vitamin D level to see if you'd potentially benefit from supplementation beyond 800 Units a day.  Folic Acid intake should be of particular concern to women of childbearing age.  Daily consumption of 867-544 mcg of Folic Acid is recommended to minimize the chance of spinal cord defects in a fetus should pregnancy occur.    For many adults, accidents still remain one of the most common culprits when it comes to cause of death.  Some of the simplest but most effective preventitive habits you can adopt include regular seatbelt use, proper helmet use, securing firearms, and regularly testing your smoke and carbon monoxide detectors.  Ikey Omary B. [place] [address], [address], [place] [address] Phone: [phone]

## 2014-04-15 ENCOUNTER — Encounter: Payer: Self-pay | Admitting: Family Medicine

## 2014-04-15 ENCOUNTER — Telehealth: Payer: Self-pay | Admitting: Family Medicine

## 2014-04-15 DIAGNOSIS — E785 Hyperlipidemia, unspecified: Secondary | ICD-10-CM | POA: Insufficient documentation

## 2014-04-15 MED ORDER — ATORVASTATIN CALCIUM 20 MG PO TABS: 20.0000 mg | ORAL_TABLET | Freq: Every day | ORAL | Status: DC

## 2014-04-15 NOTE — Telephone Encounter (Signed)
Pt.notified

## 2014-04-15 NOTE — Telephone Encounter (Signed)
Marc Lowe, Will you please let patient know that his triglycerides and cholesterol were elevated to a degree where I would recommend starting on a cholesterol lowering medication to reduce his risk of a heart attack or stroke. I've sent atorvastatin to his [facility] on file.  Kidney, liver function, and blood sugar were normal.  His hemoglobin level was elevated above the normal range which is likely due to his testosterone supplementation.  I would recommend that he share this news with whoever is prescribing his supplementation since the dose may need to be lowered. F/U with me in 3 months for cholesterol.

## 2014-09-30 ENCOUNTER — Ambulatory Visit (INDEPENDENT_AMBULATORY_CARE_PROVIDER_SITE_OTHER): Payer: BC Managed Care – PPO | Admitting: Family Medicine

## 2014-09-30 ENCOUNTER — Encounter: Payer: Self-pay | Admitting: Family Medicine

## 2014-09-30 VITALS — BP 123/86 | HR 96 | Temp 98.1°F | Wt 220.0 lb

## 2014-09-30 DIAGNOSIS — M25512 Pain in left shoulder: Secondary | ICD-10-CM | POA: Diagnosis not present

## 2014-09-30 DIAGNOSIS — R05 Cough: Secondary | ICD-10-CM

## 2014-09-30 DIAGNOSIS — R059 Cough, unspecified: Secondary | ICD-10-CM

## 2014-09-30 MED ORDER — HYDROCODONE-HOMATROPINE 5-1.5 MG/5ML PO SYRP: 5.0000 mL | ORAL_SOLUTION | Freq: Three times a day (TID) | ORAL | Status: DC | PRN

## 2014-09-30 NOTE — Progress Notes (Signed)
CC: Marc Lowe is a 64 y.o. male is here for Fatigue   Subjective: HPI:  Complains of cough nonproductive that has been present for the past 5 days. It came on acutely and abruptly and has been severe in severity however as of this morning is only mild in severity. It was accompanied by fever of 101.0, chills, fatigue, lack of appetite all of which was moderate to severe in severity but again improved drastically this morning and resolved other than the cough. The cough is particularly bad at night keeping him awake at night. He denies wheezing, shortness of breath, blood in sputum, nor chest discomfort.  Complete of left shoulder pain that has been present for the past year. It'll occur one or 2 times a month. It's described as a sharp pain in the anterior aspect of the shoulder and nonradiating. They will come and go without any warning usually lasts for a few days. When present he has difficulty raising his arm beyond 75 of abduction or flexion. Symptoms are improved if he rests his arm. He denies any swelling redness or warmth of the joint nor any left upper extremity motor or sensory disturbances. He denies any recent or remote trauma.   Review Of Systems Outlined In HPI  Past Medical History  Diagnosis Date  . Diverticulitis     No past surgical history on file. No family history on file.  History   Social History  . Marital Status: Married    Spouse Name: N/A  . Number of Children: N/A  . Years of Education: N/A   Occupational History  . Not on file.   Social History Main Topics  . Smoking status: Never Smoker   . Smokeless tobacco: Not on file  . Alcohol Use: Not on file  . Drug Use: No  . Sexual Activity:    Partners: Female   Other Topics Concern  . Not on file   Social History Narrative     Objective: BP 123/86 mmHg  Pulse 96  Temp(Src) 98.1 F (36.7 C) (Oral)  Wt 220 lb (99.791 kg)  SpO2 96%  General: Alert and Oriented, No Acute Distress HEENT:  Pupils equal, round, reactive to light. Conjunctivae clear.  External ears unremarkable, canals clear with intact TMs with appropriate landmarks.  Middle ear appears open without effusion. Pink inferior turbinates.  Moist mucous membranes, pharynx without inflammation nor lesions.  Neck supple without palpable lymphadenopathy nor abnormal masses. Lungs: Clear to auscultation bilaterally, no wheezing/ronchi/rales.  Comfortable work of breathing. Good air movement. Cardiac: Regular rate and rhythm. Normal S1/S2.  No murmurs, rubs, nor gallops.   Left shoulder exam reveals full range of motion and strength in all planes of motion and with individual rotator cuff testing. No overlying redness warmth or swelling.  Neer's test negative.  Hawkins test negative. Empty can negative. Crossarm test negative. O'Brien's test negative. Apprehension test negative. Speed's test negative. Extremities: No peripheral edema.  Strong peripheral pulses.  Mental Status: No depression, anxiety, nor agitation. Skin: Warm and dry.  Assessment & Plan: Marc Lowe was seen today for fatigue.  Diagnoses and all orders for this visit:  Cough Orders: -     HYDROcodone-homatropine (HYCODAN) 5-1.5 MG/5ML syrup; Take 5 mLs by mouth every 8 (eight) hours as needed for cough.  Left shoulder pain   Cough: It sounds like he had the flu and that this is mostly resolved. For any persistent cough I offered him Hycodan, no sign of active infection at this  time. Left shoulder pain most likely due to rotator cuff strain or tendinitis. He was given a home rehabilitative exercise plan to engage in on daily basis for the next month to help reduce recurrence of pain.  25 minutes spent face-to-face during visit today of which at least 50% was counseling or coordinating care regarding: 1. Cough   2. Left shoulder pain      Return if symptoms worsen or fail to improve.

## 2015-02-09 ENCOUNTER — Ambulatory Visit (INDEPENDENT_AMBULATORY_CARE_PROVIDER_SITE_OTHER): Payer: BC Managed Care – PPO | Admitting: Family Medicine

## 2015-02-09 ENCOUNTER — Encounter: Payer: Self-pay | Admitting: Family Medicine

## 2015-02-09 VITALS — BP 130/88 | HR 70 | Ht 74.0 in | Wt 225.0 lb

## 2015-02-09 DIAGNOSIS — Z0000 Encounter for general adult medical examination without abnormal findings: Secondary | ICD-10-CM | POA: Diagnosis not present

## 2015-02-09 DIAGNOSIS — R972 Elevated prostate specific antigen [PSA]: Secondary | ICD-10-CM | POA: Diagnosis not present

## 2015-02-09 DIAGNOSIS — R29898 Other symptoms and signs involving the musculoskeletal system: Secondary | ICD-10-CM | POA: Diagnosis not present

## 2015-02-09 LAB — COMPLETE METABOLIC PANEL WITH GFR
ALBUMIN: 4 g/dL (ref 3.6–5.1)
ALK PHOS: 59 U/L (ref 40–115)
ALT: 21 U/L (ref 9–46)
AST: 18 U/L (ref 10–35)
BILIRUBIN TOTAL: 0.5 mg/dL (ref 0.2–1.2)
BUN: 16 mg/dL (ref 7–25)
CHLORIDE: 103 meq/L (ref 98–110)
CO2: 27 meq/L (ref 20–31)
CREATININE: 0.91 mg/dL (ref 0.70–1.25)
Calcium: 9.2 mg/dL (ref 8.6–10.3)
GFR, Est African American: >89 mL/min (ref >=60)
GFR, Est Non African American: >89 mL/min (ref >=60)
GLUCOSE: 94 mg/dL (ref 65–99)
POTASSIUM: 4.6 meq/L (ref 3.5–5.3)
Sodium: 140 mEq/L (ref 135–146)
Total Protein: 6.5 g/dL (ref 6.1–8.1)

## 2015-02-09 LAB — CBC
HEMATOCRIT: 45.9 % (ref 39.0–52.0)
Hemoglobin: 15.6 g/dL (ref 13.0–17.0)
MCH: 29.2 pg (ref 26.0–34.0)
MCHC: 34 g/dL (ref 30.0–36.0)
MCV: 85.8 fL (ref 78.0–100.0)
MPV: 10.1 fL (ref 8.6–12.4)
Platelets: 225 10*3/uL (ref 150–400)
RBC: 5.35 MIL/uL (ref 4.22–5.81)
RDW: 14.1 % (ref 11.5–15.5)
WBC: 4.3 10*3/uL (ref 4.0–10.5)

## 2015-02-09 LAB — LIPID PANEL
CHOL/HDL RATIO: 5 ratio (ref <=5.0)
Cholesterol: 220 mg/dL — ABNORMAL HIGH (ref 125–200)
HDL: 44 mg/dL (ref >=40)
LDL CALC: 145 mg/dL — AB (ref <130)
Triglycerides: 153 mg/dL — ABNORMAL HIGH (ref <150)
VLDL: 31 mg/dL — AB (ref <30)

## 2015-02-09 MED ORDER — AMBULATORY NON FORMULARY MEDICATION: Status: DC

## 2015-02-09 NOTE — Progress Notes (Signed)
CC: Marc Lowe is a 64 y.o. male is here for Annual Exam   Subjective: HPI:  Colonoscopy: Believes he's had this within the last five years, UTD. Prostate: Discussed screening risks/beneifts with patienttoday, he will receive a PSA test  Influenza Vaccine: Up-to-date Pneumovax: no current indication Td/Tdap: up-to-date Zoster:up-to-date  Right arm weakness for the past 3-5 months.  Involving entire arm down into the hands, no longer able to brush teeth.  Reports subjective atrophy in bicep and forearm.  Tingling sensation in the entire arm when rotating neck to the right, absent when rotating to the left.   Review of Systems - General ROS: negative for - chills, fever, night sweats, weight gain or weight loss Ophthalmic ROS: negative for - decreased vision Psychological ROS: negative for - anxiety or depression ENT ROS: negative for - hearing change, nasal congestion, tinnitus or allergies Hematological and Lymphatic ROS: negative for - bleeding problems, bruising or swollen lymph nodes Breast ROS: negative Respiratory ROS: no cough, shortness of breath, or wheezing Cardiovascular ROS: no chest pain or dyspnea on exertion Gastrointestinal ROS: no abdominal pain, change in bowel habits, or black or bloody stools Genito-Urinary ROS: negative for - genital discharge, genital ulcers, incontinence or abnormal bleeding from genitals Musculoskeletal ROS: negative for - joint pain or muscle pain Neurological ROS: negative for - headaches or memory loss Dermatological ROS: negative for lumps, mole changes, rash and skin lesion changes  Past Medical History  Diagnosis Date  . Diverticulitis     No past surgical history on file. No family history on file.  History   Social History  . Marital Status: Married    Spouse Name: N/A  . Number of Children: N/A  . Years of Education: N/A   Occupational History  . Not on file.   Social History Main Topics  . Smoking status: Never  Smoker   . Smokeless tobacco: Not on file  . Alcohol Use: Not on file  . Drug Use: No  . Sexual Activity:    Partners: Female   Other Topics Concern  . Not on file   Social History Narrative     Objective: BP 130/88 mmHg  Pulse 70  Ht 6\' 2"  (1.88 m)  Wt 225 lb (102.059 kg)  BMI 28.88 kg/m2  General: No Acute Distress HEENT: Atraumatic, normocephalic, conjunctivae normal without scleral icterus.  No nasal discharge, hearing grossly intact, TMs with good landmarks bilaterally with no middle ear abnormalities, posterior pharynx clear without oral lesions. Neck: Supple, trachea midline, no cervical nor supraclavicular adenopathy. Pulmonary: Clear to auscultation bilaterally without wheezing, rhonchi, nor rales. Cardiac: Regular rate and rhythm.  No murmurs, rubs, nor gallops. No peripheral edema.  2+ peripheral pulses bilaterally. Abdomen: Bowel sounds normal.  No masses.  Non-tender without rebound.  Negative Murphy's sign. MSK: Grossly intact, no signs of weakness in the lower extremities or left upper extremity. Mild but noticeable impairment of right-sided elbow flexion and extension, mild right biceps atrophy.  Full ROM in upper and lower extremities.  No midline spinal tenderness. Neuro: Gait unremarkable, CN II-XII grossly intact.  C5-C6 Reflex 1/4 on the right, 2 over 4 on the left, L4 Reflex 2/4 Bilaterally.  Cerebellar function intact. Skin: No rashes. Psych: Alert and oriented to person/place/time.  Thought process normal. No anxiety/depression.  Assessment & Plan: Eissa was seen today for annual exam.  Diagnoses and all orders for this visit:  Annual physical exam Orders: -     Lipid panel -  COMPLETE METABOLIC PANEL WITH GFR -     CBC -     PSA  ELEVATED PROSTATE SPECIFIC ANTIGEN  Right arm weakness Orders: -     Ambulatory referral to Neurology  Other orders -     AMBULATORY NON FORMULARY MEDICATION; Deep tissue massage to the cervical spine region 2-3  times a month as needed for cervical radiculopathy and right arm weakness.   Healthy lifestyle interventions including but not limited to regular exercise, a healthy low fat diet, moderation of salt intake, the dangers of tobacco/alcohol/recreational drug use, nutrition supplementation, and accident avoidance were discussed with the patient and a handout was provided for future reference.  Will refer to neurology for further workup of his right arm weakness, he may need nerve conduction studies.  Return in about 4 weeks (around 03/09/2015).

## 2015-02-10 ENCOUNTER — Telehealth: Payer: Self-pay | Admitting: Family Medicine

## 2015-02-10 DIAGNOSIS — R972 Elevated prostate specific antigen [PSA]: Secondary | ICD-10-CM

## 2015-02-10 DIAGNOSIS — E785 Hyperlipidemia, unspecified: Secondary | ICD-10-CM

## 2015-02-10 LAB — PSA: PSA: 4.98 ng/mL — ABNORMAL HIGH (ref <=4.00)

## 2015-02-10 MED ORDER — RED YEAST RICE 600 MG PO TABS: ORAL_TABLET | ORAL | Status: DC

## 2015-02-10 NOTE — Telephone Encounter (Signed)
Pt.notified

## 2015-02-10 NOTE — Telephone Encounter (Signed)
Marc Lowe, Will you please let patient know that his cholesterol was moderately elevated.  Since he is intolerant to many of the statin medications usually used to lower cholesterol I'd recommend he start taking an OTC herbal supplement called Red Yeast Rice at a dose of 2.4g daily.  Also his PSA prostate test was elevated again but no higher than it's been in the past.  I'd recommend he have this rechecked in one month.  A lab slip has been placed in your inbox.

## 2015-02-13 ENCOUNTER — Telehealth: Payer: Self-pay | Admitting: Family Medicine

## 2015-02-13 DIAGNOSIS — R29898 Other symptoms and signs involving the musculoskeletal system: Secondary | ICD-10-CM

## 2015-02-13 NOTE — Telephone Encounter (Signed)
NCV study

## 2015-02-14 ENCOUNTER — Telehealth: Payer: Self-pay | Admitting: *Deleted

## 2015-02-14 NOTE — Telephone Encounter (Signed)
Can you please update pt on the status of his referral. He called and states he has not heard anything yet

## 2015-02-15 NOTE — Telephone Encounter (Signed)
I forwarded this to Jenny Reichmann who is doing referrals

## 2015-03-22 ENCOUNTER — Telehealth: Payer: Self-pay | Admitting: *Deleted

## 2015-03-22 NOTE — Telephone Encounter (Signed)
Marc Lowe, Can you ask him to first check with [facility] because when I look in their EMR he has an appointment on the 12th this month.

## 2015-03-22 NOTE — Telephone Encounter (Signed)
Pt called and states his right arm still has weakness and numbness. Pt states that he has seen two chirpractors and they recommended an MRI. I asked pt if he was able to get the nerve conduction test and it the test has been pushed out until Oct. Pt states he would like to go ahead and get an MRI done. I explained to him we could place an order for MRI but not sure if that's what's best right now and may be wise to wait on results of nerve conduction. Also insurance may not pay for until certain things have been completed/ ruled out. Will rout to provider for further advice

## 2015-03-22 NOTE — Telephone Encounter (Signed)
Pt will call me back

## 2015-03-23 NOTE — Telephone Encounter (Signed)
Thanks, you just saved him a bunch of money.

## 2015-03-23 NOTE — Telephone Encounter (Signed)
Pt found out he had an appt on Monday for a nerve conduction test

## 2015-03-24 NOTE — Telephone Encounter (Signed)
Pt notified to go ahead and get the nerve conduction test because this test will determine what body part needs to have the MRI. Pt voiced understanding

## 2015-04-04 ENCOUNTER — Telehealth: Payer: Self-pay | Admitting: *Deleted

## 2015-04-04 DIAGNOSIS — G542 Cervical root disorders, not elsewhere classified: Secondary | ICD-10-CM

## 2015-04-04 NOTE — Telephone Encounter (Signed)
Pt.notified

## 2015-04-04 NOTE — Telephone Encounter (Signed)
I've not received anything and when I look in the [facility] system results are still pending.

## 2015-04-04 NOTE — Telephone Encounter (Signed)
Pt would like to know if we have received results of nerve conduction test

## 2015-04-11 DIAGNOSIS — G542 Cervical root disorders, not elsewhere classified: Secondary | ICD-10-CM | POA: Insufficient documentation

## 2015-04-11 NOTE — Telephone Encounter (Signed)
Seth Bake, I never got notified about his results but I did look in the [facility] system today and it looks like his weakness is coming from a pinched nerve root in the neck.  I'd recommend trying a prednisone taper regimen to see if this helps reduce some of the inflammation being induced on this nerve. Just in case this doesn't work the next step will be to see a neurosurgeon, a referral will be placed for this just in case.  I'm going to use the [place] so the nerve conduction study can be easily accessed.

## 2015-04-11 NOTE — Telephone Encounter (Signed)
Pt.notified

## 2015-04-11 NOTE — Addendum Note (Signed)
Addended by: Marcial Pacas on: 04/11/2015 04:12 PM   Modules accepted: Orders

## 2015-04-21 ENCOUNTER — Telehealth: Payer: Self-pay | Admitting: Family Medicine

## 2015-04-21 DIAGNOSIS — G542 Cervical root disorders, not elsewhere classified: Secondary | ICD-10-CM

## 2015-04-21 NOTE — Telephone Encounter (Signed)
Marc Lowe,    I received a call from [facility] about [name].  Dr. Ileene Rubens placed a referral for him to go here to have a cervical nerve root impingement. After Dr. Doug Sou reviewed the medical records he is requesting that we order a MRI and then send him the report to review as well. Their phone number is [phone] and [phone]. Thank you for your help with this. - CF

## 2015-04-25 NOTE — Telephone Encounter (Signed)
Patient advised. He agreed to the xray and formal PT.

## 2015-04-25 NOTE — Telephone Encounter (Signed)
Dr Kelly Splinter office wants [place] to schedule patient for an MRI. I'm not sure what to order. Please advise and I will order under Dr Ileene Rubens. Dr Hommel's patient.

## 2015-04-25 NOTE — Telephone Encounter (Signed)
He will need both an x-ray, and likely an MRI, if he has not a formal physical therapy, then this is an appropriate option before considering MRI, we only do MRI if we are planning an intervention. I do understand that he has had some chiropractic care. I will order an x-ray, but let me know if he is amenable to do some physical therapy prior.  Also, is he having progressive weakness of the upper extremity?

## 2015-05-01 ENCOUNTER — Ambulatory Visit (INDEPENDENT_AMBULATORY_CARE_PROVIDER_SITE_OTHER): Payer: BC Managed Care – PPO

## 2015-05-01 DIAGNOSIS — M47812 Spondylosis without myelopathy or radiculopathy, cervical region: Secondary | ICD-10-CM | POA: Diagnosis not present

## 2015-05-01 DIAGNOSIS — M5030 Other cervical disc degeneration, unspecified cervical region: Secondary | ICD-10-CM

## 2015-05-01 DIAGNOSIS — G542 Cervical root disorders, not elsewhere classified: Secondary | ICD-10-CM

## 2015-05-04 ENCOUNTER — Encounter: Payer: Self-pay | Admitting: Rehabilitative and Restorative Service Providers"

## 2015-05-04 ENCOUNTER — Ambulatory Visit (INDEPENDENT_AMBULATORY_CARE_PROVIDER_SITE_OTHER): Payer: BC Managed Care – PPO | Admitting: Rehabilitative and Restorative Service Providers"

## 2015-05-04 DIAGNOSIS — R29898 Other symptoms and signs involving the musculoskeletal system: Secondary | ICD-10-CM | POA: Diagnosis not present

## 2015-05-04 DIAGNOSIS — Z7409 Other reduced mobility: Secondary | ICD-10-CM | POA: Diagnosis not present

## 2015-05-04 DIAGNOSIS — M436 Torticollis: Secondary | ICD-10-CM | POA: Diagnosis not present

## 2015-05-04 DIAGNOSIS — R293 Abnormal posture: Secondary | ICD-10-CM | POA: Diagnosis not present

## 2015-05-04 DIAGNOSIS — R531 Weakness: Secondary | ICD-10-CM

## 2015-05-04 DIAGNOSIS — R6889 Other general symptoms and signs: Secondary | ICD-10-CM

## 2015-05-04 NOTE — Therapy (Signed)
[place], [place], [address] Phone: [phone]   Fax:  [phone]  Physical Therapy Evaluation  Patient Details  Name: Marc Lowe MRN: [id number] Date of Birth: 04/11/1951 Referring Provider: Dr. Dianah Field  Encounter Date: 05/04/2015      PT End of Session - 05/04/15 0854    Visit Number 1   Number of Visits 12   Date for PT Re-Evaluation 06/15/15   PT Start Time 8099   PT Stop Time 0959   PT Time Calculation (min) 65 min   Activity Tolerance Patient tolerated treatment well      Past Medical History  Diagnosis Date  . Diverticulitis     History reviewed. No pertinent past surgical history.  There were no vitals filed for this visit.  Visit Diagnosis:  Abnormal posture - Plan: PT plan of care cert/re-cert  Weakness of left upper extremity - Plan: PT plan of care cert/re-cert  Stiffness of cervical spine - Plan: PT plan of care cert/re-cert  Decreased strength, endurance, and mobility - Plan: PT plan of care cert/re-cert      Subjective Assessment - 05/04/15 0856    Subjective Patient reports radicular symptoms into the Rt UE including numbness and tingling as well as weakness into the Rt arm. He has not had pain and the tingling has resolved. he continues to have weakness.   Pertinent History chiropractic care for life for LBP; adjustment to upper back and neck ~2 years ago   How long can you sit comfortably? 1 hour with Rt LE pain   How long can you stand comfortably? no limit   How long can you walk comfortably? no limit   Diagnostic tests xrays; NCV showed impingement in neck   Patient Stated Goals strengthening Rt arm   Currently in Pain? No/denies            [facility] PT Assessment - 05/04/15 0001    Assessment   Medical Diagnosis Cervical impingment - Rt UE weakness   Referring Provider Dr. Dianah Field   Onset Date/Surgical Date 11/28/14   Hand Dominance  Right   Next MD Visit no return scheduled   Precautions   Precautions None   Balance Screen   Has the patient fallen in the past 6 months No   Has the patient had a decrease in activity level because of a fear of falling?  No   Is the patient reluctant to leave their home because of a fear of falling?  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; crawling; lifting; bending; reaching - lifting up to 150 # alone - two man lift 200 + #   Leisure hiking ~ 2 x/month   Observation/Other Assessments   Focus on Therapeutic Outcomes (FOTO)  33% limitation   Sensation   Additional Comments resolved    Posture/Postural Control   Posture Comments head forward; shoulders rounded; head of the humerus anterior in orientation; increased thoracic kyphosis; scapulae abducted and rotated along the thoracic wall    AROM   Right Shoulder Flexion 146 Degrees   Right Shoulder ABduction 157 Degrees   Right Shoulder Internal Rotation 26 Degrees   Right Shoulder External Rotation 90 Degrees   Left Shoulder Flexion 152 Degrees   Left Shoulder ABduction 155 Degrees   Left Shoulder Internal  Rotation 31 Degrees   Left Shoulder External Rotation 83 Degrees   Cervical Flexion 55   Cervical Extension 40   Cervical - Right Side Bend 29   Cervical - Left Side Bend 28   Cervical - Right Rotation 64   Cervical - Left Rotation 62   Strength   Right/Left Shoulder --  Lt shd 5/5 throughout   Right Shoulder Flexion 4-/5   Right Shoulder Extension 5/5   Right Shoulder ABduction 4+/5   Right Shoulder Internal Rotation 5/5   Right Shoulder External Rotation 5/5   Right Shoulder Horizontal ABduction --  5-/5   Right Shoulder Horizontal ADduction 5/5   Right Elbow Flexion 3+/5   Right Elbow Extension 5/5   Palpation   Palpation comment tight upper traps;  ant/lat/post cervical musculature Rt > Lt                   [facility] Adult PT Treatment/Exercise - 05/04/15 0001    Neuro Re-ed    Neuro Re-ed Details  working on posture and alignment - working to activate the posterior shoulder girdle    Neck Exercises: Standing   Neck Retraction 10 reps;10 secs   Other Standing Exercises scap squeeze with noodle 10 reps 10 sec hold   Neck Exercises: Supine   Neck Retraction 10 reps;10 secs   Shoulder Exercises: Stretch   Corner Stretch Limitations doorway 3 positions 3 reps 20-30 sec hold   Cryotherapy   Number Minutes Cryotherapy 15 Minutes   Cryotherapy Location Cervical   Type of Cryotherapy Ice pack                PT Education - 05/04/15 0935    Education provided Yes   Education Details posture and alignment; HEP   Person(s) Educated Patient   Methods Explanation;Demonstration;Tactile cues;Verbal cues;Handout   Comprehension Verbalized understanding;Returned demonstration;Verbal cues required;Tactile cues required             PT Long Term Goals - 05/04/15 1236    PT LONG TERM GOAL #1   Title Increase Rt UE strength to =/> 4+/5 throughout 06/15/15   Time 6   Period Weeks   Status New   PT LONG TERM GOAL #2   Title Imcrease cervical ROM/mobility by 5-10 degrees in rotation and lateral flexion 06/15/15   Time 6   Period Weeks   Status New   PT LONG TERM GOAL #3   Title Improve posture and alignment with patient to demonstrate good position for head and shoulders in plum line 06/15/15   Time 6   Period Weeks   Status New   PT LONG TERM GOAL #4   Title Improve body mechanics with less patterns of substitution and abnormal movement patterns with elevation of Rt UE 06/15/15   Time 6   Period Weeks   Status New   PT LONG TERM GOAL #5   Title Improve FOTO to </= 24% limitation 06/15/15   Time 6   Period Weeks   Status New               Plan - 05/04/15 1229    Clinical Impression Statement Patient  presents with Rt UE cervical root impingment with Rt UE weakness; limited cervical and UE ROM/ mobility; very poor posture and alignment; limited functional activity level due to deficits. Patient will benefit from Physical Therapy to address problems identified.    Pt will benefit from skilled therapeutic intervention in order to improve on the  following deficits Decreased strength;Decreased range of motion;Decreased mobility;Increased fascial restricitons;Decreased activity tolerance;Decreased endurance;Postural dysfunction;Improper body mechanics   Rehab Potential Good   PT Frequency 2x / week   PT Duration 6 weeks   PT Treatment/Interventions Patient/family education;ADLs/Self Care Home Management;Therapeutic exercise;Therapeutic activities;Manual techniques;Dry needling;Cryotherapy;Electrical Stimulation;Moist Heat;Ultrasound;Traction   PT Next Visit Plan continue work on posture and alignment; thoracic mobilization and stretching; axial extension(which created some intermittent tingling Rt UE); stretching; postural strengtheing; UE strengthening   PT Home Exercise Plan postural correction; HEP   Consulted and Agree with Plan of Care Patient         Problem List Patient Active Problem List   Diagnosis Date Noted  . 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 Hyperlipidemia 04/15/2014  . History of bowel resection 04/14/2014  . Testosterone deficiency 04/14/2014  . DIARRHEA 03/14/2010  . ABDOMINAL PAIN, GENERALIZED 03/14/2010  . VARICES OF OTHER SITES 10/04/2009  . IMPAIRED FASTING GLUCOSE 07/24/2009  . PARONYCHIA, FINGER 07/13/2009  . SKIN RASH 07/13/2009  . ELEVATED PROSTATE SPECIFIC ANTIGEN 04/10/2009  . VIRAL URI 05/09/2008  . CELLULITIS AND ABSCESS OF OTHER SPECIFIED SITE 05/09/2008  . CHRONIC RHINITIS 06/04/2007  . 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 HYPOGONADISM, MALE 11/04/2006  . DISORDER, DEPRESSIVE NEC 11/04/2006  . HYPERLIPIDEMIA 10/07/2006  . ACNE ROSACEA  10/07/2006  . LIBIDO, DECREASED 10/07/2006    Ko Bardon Nilda Simmer PT, [facility] 05/04/2015, 12:53 PM  [facility] [place], [place], [address] Phone: [phone]   Fax:  [phone]  Name: Marc Lowe MRN: [id number] Date of Birth: 05/16/1951

## 2015-05-04 NOTE — Patient Instructions (Signed)
Lift chest pulling shoulder blades down and back - working on posture throughout the day   Axial Extension (Chin Tuck)    Pull chin in and lengthen back of neck. Hold __10-15__ seconds while counting out loud. Repeat __5-10__ times. Do _several___ sessions per day.    Shoulder Blade Squeeze    Rotate shoulders back, then squeeze shoulder blades down and back Hold 10 sec 10 reps several times/day. Repeat ____ times. Do ____ sessions per day.  Scapula Adduction With Pectoralis Stretch: Low - Standing   Shoulders at 45 hands even with shoulders, keeping weight through legs, shift weight forward until you feel pull or stretch through the front of your chest. Hold _30__ seconds. Do _3__ times, _2-4__ times per day.   Scapula Adduction With Pectoralis Stretch: Mid-Range - Standing   Shoulders at 90 elbows even with shoulders, keeping weight through legs, shift weight forward until you feel pull or strength through the front of your chest. Hold __30_ seconds. Do _3__ times, __2-4_ times per day.   Scapula Adduction With Pectoralis Stretch: High - Standing   Shoulders at 120 hands up high on the doorway, keeping weight on feet, shift weight forward until you feel pull or stretch through the front of your chest. Hold _30__ seconds. Do _3__ times, _2-3__ times per day.

## 2015-05-09 ENCOUNTER — Ambulatory Visit (INDEPENDENT_AMBULATORY_CARE_PROVIDER_SITE_OTHER): Payer: BC Managed Care – PPO | Admitting: Rehabilitative and Restorative Service Providers"

## 2015-05-09 ENCOUNTER — Encounter: Payer: Self-pay | Admitting: Rehabilitative and Restorative Service Providers"

## 2015-05-09 DIAGNOSIS — R531 Weakness: Secondary | ICD-10-CM

## 2015-05-09 DIAGNOSIS — M436 Torticollis: Secondary | ICD-10-CM

## 2015-05-09 DIAGNOSIS — R293 Abnormal posture: Secondary | ICD-10-CM

## 2015-05-09 DIAGNOSIS — Z7409 Other reduced mobility: Secondary | ICD-10-CM | POA: Diagnosis not present

## 2015-05-09 DIAGNOSIS — R29898 Other symptoms and signs involving the musculoskeletal system: Secondary | ICD-10-CM

## 2015-05-09 DIAGNOSIS — R6889 Other general symptoms and signs: Secondary | ICD-10-CM

## 2015-05-09 NOTE — Patient Instructions (Signed)
Back to Wall: Scaption Flexion With Scapular Control - Standing    Lift one arm out diagonally from hip. Lower slowly, do not let shoulder blade drop. Use wall to help monitor movement. Do _10__ times 2-3 sets, both arms, keeping shoulders down and back. 1-2 times per day.    Ball on Wall: Side to Side - Standing    Roll ball side to side, up and down, diagonally, in circles. Do this actively with both hands on ball. Roll _1-2 min  __1-2_ times per day. Bounce ball on wall overhead for 1-2 min 1-3 times   Step under large ball keeping hands together and elbows straight.  Hold 20 sec 3 reps 1-2 times/day   ROM: Abduction (Standing)    Bring arms straight out from sides elbows bent. Raise arms up to shoulder height. Hold 2-3 sec  Repeat _10___ times per set. Do __1-3__ sets per session. Do __1__ sessions per day.   Elbow Flexion: Resisted    With right arm straight, thumb forward, Holding __3-5__ pound weight, bend elbow. Return slowly. Repeat __10_ times per set. Do __1-3__ sets per session. Do __1-2__ sessions per day. Repeat above with palm up

## 2015-05-09 NOTE — Therapy (Signed)
[place], [place], [address] Phone: [phone]   Fax:  [phone]  Physical Therapy Treatment  Patient Details  Name: Marc Lowe MRN: [id number] Date of Birth: Dec 04, 1950 Referring Provider: Dr. Dianah Field  Encounter Date: 05/09/2015      PT End of Session - 05/09/15 0935    Visit Number 2   Number of Visits 12   Date for PT Re-Evaluation 06/15/15   PT Start Time 0850   PT Stop Time 0940   PT Time Calculation (min) 50 min   Activity Tolerance Patient tolerated treatment well      Past Medical History  Diagnosis Date  . Diverticulitis     History reviewed. No pertinent past surgical history.  There were no vitals filed for this visit.  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 Working on his exercises - a lot when he drives.   Currently in Pain? No/denies                         [facility] Adult PT Treatment/Exercise - 05/09/15 0001    Neuro Re-ed    Neuro Re-ed Details  working on posture and alignment - working to activate the posterior shoulder girdle    Neck Exercises: Standing   Neck Retraction 10 reps;10 secs   Other Standing Exercises scap squeeze with noodle 10 reps 10 sec hold   Neck Exercises: Supine   Neck Retraction 10 reps;10 secs   Shoulder Exercises: Standing   ABduction Strengthening;Both;20 reps;Weights  scaption   Shoulder ABduction Weight (lbs) 2#   ABduction Limitations abduction with elbows flexed bringing shoulder to 90 deg 2# wt parrell to floor    Retraction Strengthening;Both;10 reps  10 sec hold with noodle    Shoulder Elevation Strengthening;Both;10  reps;Standing  2#   Shoulder Exercises: Therapy Ball   Flexion --  ball on wall rolling and bouncing overhead   Flexion Limitations stretching into flexion 20 sec hold 3 reps bilat shd   Shoulder Exercises: ROM/Strengthening   UBE (Upper Arm Bike) L2 4' 2 fwd/2back   Shoulder Exercises: Body Blade   Flexion 30 seconds;3 reps   Cryotherapy   Number Minutes Cryotherapy 15 Minutes   Cryotherapy Location Shoulder;Upper arm   Type of Cryotherapy Ice pack                PT Education - 05/09/15 0931    Education provided Yes   Education Details posture; HEP   Person(s) Educated Patient   Methods Explanation;Demonstration;Tactile cues;Verbal cues;Handout   Comprehension Verbalized understanding;Returned demonstration;Verbal cues required;Tactile cues required             PT Long Term Goals - 05/09/15 0937    PT LONG TERM GOAL #1   Title Increase Rt UE strength to =/> 4+/5 throughout 06/15/15   Time 6   Period Weeks   Status On-going   PT LONG TERM GOAL #2   Title Imcrease cervical ROM/mobility by 5-10 degrees in rotation and lateral flexion 06/15/15   Time 6   Period Weeks   Status On-going   PT LONG TERM GOAL #3   Title  Improve posture and alignment with patient to demonstrate good position for head and shoulders in plum line 06/15/15   Time 6   Period Weeks   Status On-going   PT LONG TERM GOAL #4   Title Improve body mechanics with less patterns of substitution and abnormal movement patterns with elevation of Rt UE 06/15/15   Time 6   Period Weeks   Status On-going   PT LONG TERM GOAL #5   Title Improve FOTO to </= 24% limitation 06/15/15   Time 6   Period Weeks   Status On-going               Plan - 05/09/15 0935    Clinical Impression Statement Rt UE cervical root impingement Rt UE weakness shd flex/abd/elbowflex; poor posture. Tolerated exercise with some fatigue.    Pt will benefit from skilled therapeutic intervention in order to improve on  the following deficits Decreased strength;Decreased range of motion;Decreased mobility;Increased fascial restricitons;Decreased activity tolerance;Decreased endurance;Postural dysfunction;Improper body mechanics   Rehab Potential Good   PT Frequency 2x / week   PT Duration 6 weeks   PT Treatment/Interventions Patient/family education;ADLs/Self Care Home Management;Therapeutic exercise;Therapeutic activities;Manual techniques;Dry needling;Cryotherapy;Electrical Stimulation;Moist Heat;Ultrasound;Traction   PT Next Visit Plan continue work on posture and alignment; thoracic mobilization and stretching; axial extension(which created some intermittent tingling Rt UE); stretching; postural strengtheing; UE strengthening   PT Home Exercise Plan postural correction; HEP   Consulted and Agree with Plan of Care Patient        Problem List Patient Active Problem List   Diagnosis Date Noted  . 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 Hyperlipidemia 04/15/2014  . History of bowel resection 04/14/2014  . Testosterone deficiency 04/14/2014  . DIARRHEA 03/14/2010  . ABDOMINAL PAIN, GENERALIZED 03/14/2010  . VARICES OF OTHER SITES 10/04/2009  . IMPAIRED FASTING GLUCOSE 07/24/2009  . PARONYCHIA, FINGER 07/13/2009  . SKIN RASH 07/13/2009  . ELEVATED PROSTATE SPECIFIC ANTIGEN 04/10/2009  . VIRAL URI 05/09/2008  . CELLULITIS AND ABSCESS OF OTHER SPECIFIED SITE 05/09/2008  . CHRONIC RHINITIS 06/04/2007  . 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 HYPOGONADISM, MALE 11/04/2006  . DISORDER, DEPRESSIVE NEC 11/04/2006  . HYPERLIPIDEMIA 10/07/2006  . ACNE ROSACEA 10/07/2006  . LIBIDO, DECREASED 10/07/2006    Marc Lowe PT, MPH 05/09/2015, 9:39 AM  [facility] [place], [place], [address] Phone: [phone]   Fax:  [phone]  Name: Marc Lowe MRN: [id number] Date of Birth: [birth year]

## 2015-05-11 ENCOUNTER — Ambulatory Visit (INDEPENDENT_AMBULATORY_CARE_PROVIDER_SITE_OTHER): Payer: BC Managed Care – PPO | Admitting: Rehabilitative and Restorative Service Providers"

## 2015-05-11 ENCOUNTER — Encounter: Payer: Self-pay | Admitting: Rehabilitative and Restorative Service Providers"

## 2015-05-11 DIAGNOSIS — M436 Torticollis: Secondary | ICD-10-CM | POA: Diagnosis not present

## 2015-05-11 DIAGNOSIS — Z7409 Other reduced mobility: Secondary | ICD-10-CM | POA: Diagnosis not present

## 2015-05-11 DIAGNOSIS — R531 Weakness: Secondary | ICD-10-CM

## 2015-05-11 DIAGNOSIS — R6889 Other general symptoms and signs: Secondary | ICD-10-CM

## 2015-05-11 DIAGNOSIS — R29898 Other symptoms and signs involving the musculoskeletal system: Secondary | ICD-10-CM

## 2015-05-11 DIAGNOSIS — R293 Abnormal posture: Secondary | ICD-10-CM

## 2015-05-11 NOTE — Patient Instructions (Signed)
Resisted External Rotation: in Neutral - Bilateral   PALMS UP Sit or stand, tubing in both hands, elbows at sides, bent to 90, forearms forward. Pinch shoulder blades together and rotate forearms out. Keep elbows at sides. Repeat __10__ times per set. Do _2-3___ sets per session. Do _2-3___ sessions per day.   Low Row: Standing   Face anchor, feet shoulder width apart. Palms up, pull arms back, squeezing shoulder blades together. Repeat 10__ times per set. Do 2-3__ sets per session. Do 2-3__ sessions per week. Anchor Height: Waist   Strengthening: Resisted Extension   Hold tubing in right hand, arm forward. Pull arm back, elbow straight. Repeat _10___ times per set. Do 2-3____ sets per session. Do 2-3____ sessions per day.    Elbow Flexion: Resisted    With right arm straight, palm up, Holding _0-2___ pound weight, bend elbow. Return slowly. Repeat _10__ times per set. Do _1-3___ sets per session. Do __1-2__ sessions per day.   Elbow Flexion: Resisted    With tubing wrapped around left fist and other end secured under foot, curl arm up as far as possible. Repeat __10__ times per set. Do _1-3___ sets per session. Do __1-2__ sessions per day.

## 2015-05-11 NOTE — Therapy (Signed)
[place], [place], [address] Phone: [phone]   Fax:  [phone]  Physical Therapy Treatment  Patient Details  Name: Marc Lowe MRN: [id number] Date of Birth: Feb 20, 1951 Referring Provider: Dr. Dianah Field  Encounter Date: 05/11/2015      PT End of Session - 05/11/15 0849    Visit Number 3   Number of Visits 12   Date for PT Re-Evaluation 06/15/15   PT Start Time 0849   PT Stop Time 0942   PT Time Calculation (min) 53 min   Activity Tolerance Patient tolerated treatment well      Past Medical History  Diagnosis Date  . Diverticulitis     History reviewed. No pertinent past surgical history.  There were no vitals filed for this visit.  Visit Diagnosis:  Abnormal posture  Weakness of left upper extremity  Stiffness of cervical spine  Decreased strength, endurance, and mobility      Subjective Assessment - 05/11/15 0849    Subjective Pt reports some soreness following treatment. He has some continued tingling in the Rt UE which occurs ~5 times a day lasting for few seconds to a few minutes depending on position of his head and neck. Mving his neck wil relieve the symptoms.    Currently in Pain? No/denies                         [facility] Adult PT Treatment/Exercise - 05/11/15 0001    Neck Exercises: Standing   Other Standing Exercises scap squeeze with noodle 10 reps 10 sec hold   Neck Exercises: Supine   Neck Retraction 10 reps;10 secs   Shoulder Exercises: Seated   Other Seated Exercises elbow flexion yellow TB maintaining supination 10 x 2 sets    Shoulder Exercises: Standing   ABduction Strengthening;Both;20 reps;Weights  scaption   Shoulder ABduction Weight (lbs) 2#   ABduction Limitations abduction with elbows flexed bringing shoulder to 90 deg 2# wt parrell to floor    Extension Strengthening;Right;20 reps;Theraband   Theraband Level (Shoulder  Extension) Level 3 (Green)   Row Strengthening;Both;20 reps;Theraband   Theraband Level (Shoulder Row) Level 3 (Green)   Retraction Strengthening;Both;10 reps  10 sec hold with noodle    Shoulder Elevation Strengthening;Both;10 reps;Standing  2#   Other Standing Exercises elbow flexion palm up 10x2sets   pronates wrist when he tries this with a weight   Shoulder Exercises: Therapy Ball   Flexion --  ball on wall rolling and bouncing overhead   Flexion Limitations stretching into flexion 20 sec hold 3 reps bilat shd   Shoulder Exercises: ROM/Strengthening   UBE (Upper Arm Bike) L3 4' 2 fwd/2back   Shoulder Exercises: Stretch   Corner Stretch Limitations doorway 3 positions 3 reps 20-30 sec hold   Shoulder Exercises: Body Blade   Flexion 30 seconds;3 reps   Cryotherapy   Number Minutes Cryotherapy 15 Minutes   Cryotherapy Location Shoulder;Upper arm;Cervical   Type of Cryotherapy Ice pack   Manual Therapy   Manual therapy comments pt supine head supported on pillow   Joint Mobilization cervical PA glides   Soft tissue mobilization anterior/lateral/posterior cervical musculature/upper trap/leveator/pecs   Myofascial Release ant chest    Passive ROM cervical spine flexion and scaleni stretches lateral flexion   Neck Exercises: Stretches   Other Neck Stretches axial extension 10 sec hld 10 reps  PT Education - 05/11/15 [phone]    Education provided Yes   Education Details posture; HEP; issued TB for home    Person(s) Educated Patient   Methods Explanation;Demonstration;Tactile cues;Verbal cues;Handout   Comprehension Verbalized understanding;Returned demonstration;Verbal cues required;Tactile cues required             PT Long Term Goals - 05/09/15 0937    PT LONG TERM GOAL #1   Title Increase Rt UE strength to =/> 4+/5 throughout 06/15/15   Time 6   Period Weeks   Status On-going   PT LONG TERM GOAL #2   Title Imcrease cervical ROM/mobility by  5-10 degrees in rotation and lateral flexion 06/15/15   Time 6   Period Weeks   Status On-going   PT LONG TERM GOAL #3   Title Improve posture and alignment with patient to demonstrate good position for head and shoulders in plum line 06/15/15   Time 6   Period Weeks   Status On-going   PT LONG TERM GOAL #4   Title Improve body mechanics with less patterns of substitution and abnormal movement patterns with elevation of Rt UE 06/15/15   Time 6   Period Weeks   Status On-going   PT LONG TERM GOAL #5   Title Improve FOTO to </= 24% limitation 06/15/15   Time 6   Period Weeks   Status On-going               Problem List Patient Active Problem List   Diagnosis Date Noted  . 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 Hyperlipidemia 04/15/2014  . History of bowel resection 04/14/2014  . Testosterone deficiency 04/14/2014  . DIARRHEA 03/14/2010  . ABDOMINAL PAIN, GENERALIZED 03/14/2010  . VARICES OF OTHER SITES 10/04/2009  . IMPAIRED FASTING GLUCOSE 07/24/2009  . PARONYCHIA, FINGER 07/13/2009  . SKIN RASH 07/13/2009  . ELEVATED PROSTATE SPECIFIC ANTIGEN 04/10/2009  . VIRAL URI 05/09/2008  . CELLULITIS AND ABSCESS OF OTHER SPECIFIED SITE 05/09/2008  . CHRONIC RHINITIS 06/04/2007  . 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 HYPOGONADISM, MALE 11/04/2006  . DISORDER, DEPRESSIVE NEC 11/04/2006  . HYPERLIPIDEMIA 10/07/2006  . ACNE ROSACEA 10/07/2006  . LIBIDO, DECREASED 10/07/2006    Marc Lowe PT, [facility] 05/11/2015, 12:58 PM  [facility] [place], [place], [address] Phone: [phone]   Fax:  [phone]  Name: Marc Lowe MRN: [id number] Date of Birth: 05/01/1951

## 2015-05-16 ENCOUNTER — Ambulatory Visit (INDEPENDENT_AMBULATORY_CARE_PROVIDER_SITE_OTHER): Payer: BC Managed Care – PPO | Admitting: Rehabilitative and Restorative Service Providers"

## 2015-05-16 ENCOUNTER — Encounter: Payer: Self-pay | Admitting: Rehabilitative and Restorative Service Providers"

## 2015-05-16 DIAGNOSIS — R293 Abnormal posture: Secondary | ICD-10-CM

## 2015-05-16 DIAGNOSIS — M436 Torticollis: Secondary | ICD-10-CM | POA: Diagnosis not present

## 2015-05-16 DIAGNOSIS — Z7409 Other reduced mobility: Secondary | ICD-10-CM

## 2015-05-16 DIAGNOSIS — R29898 Other symptoms and signs involving the musculoskeletal system: Secondary | ICD-10-CM

## 2015-05-16 DIAGNOSIS — R531 Weakness: Secondary | ICD-10-CM

## 2015-05-16 NOTE — Therapy (Signed)
[place] [facility] [place], [place], [address] Phone: [phone]   Fax:  [phone]  Physical Therapy Treatment  Patient Details  Name: Marc Lowe MRN: [id number] Date of Birth: 1951/03/24 Referring Provider: Dr. Dianah Field  Encounter Date: 05/16/2015      PT End of Session - 05/16/15 0854    Visit Number 4   Number of Visits 12   Date for PT Re-Evaluation 06/15/15   PT Start Time 0851   PT Stop Time 0949   PT Time Calculation (min) 58 min   Activity Tolerance Patient tolerated treatment well      Past Medical History  Diagnosis Date  . Diverticulitis     History reviewed. No pertinent past surgical history.  There were no vitals filed for this visit.  Visit Diagnosis:  Abnormal posture  Weakness of left upper extremity  Stiffness of cervical spine  Decreased strength, endurance, and mobility      Subjective Assessment - 05/16/15 0854    Subjective Amazed at how quickly his arm fatigues with his exercises. Can't really tell any difference in his strength. No significant change in ROM; small increase in strength Rt elbow flexion; shd abd. Progressing gradually toward stated goals of therapy.    Currently in Pain? No/denies            [facility] PT Assessment - 05/16/15 0001    Assessment   Medical Diagnosis Cervical impingment - Rt UE weakness   AROM   Right Shoulder Flexion 144 Degrees   Right Shoulder ABduction 156 Degrees   Right Shoulder Internal Rotation 35 Degrees   Right Shoulder External Rotation 92 Degrees   Cervical Flexion 50   Cervical Extension 48   Cervical - Right Side Bend 34   Cervical - Left Side Bend 25   Cervical - Right Rotation 62   Cervical - Left Rotation 59   Strength   Right Shoulder Flexion 4/5   Right Shoulder Extension 5/5   Right Shoulder ABduction 4+/5   Right Shoulder Internal Rotation 5/5   Right Shoulder External Rotation 5/5   Right Shoulder  Horizontal ABduction --  5-/5   Right Shoulder Horizontal ADduction 5/5   Right Elbow Flexion 4-/5   Right Elbow Extension 5/5                     [facility] Adult PT Treatment/Exercise - 05/16/15 0001    Neck Exercises: Standing   Other Standing Exercises scap squeeze with noodle 10 reps 10 sec hold   Neck Exercises: Supine   Neck Retraction 10 reps;10 secs   Shoulder Exercises: Standing   ABduction Strengthening;Both;20 reps;Weights  scaption   Shoulder ABduction Weight (lbs) 2#   ABduction Limitations abduction with elbows flexed bringing shoulder to 90 deg 2# wt parrell to floor 3x10   Extension Strengthening;Right;20 reps;Theraband   Theraband Level (Shoulder Extension) Level 4 (Blue)   Row Strengthening;Both;20 reps;Theraband   Theraband Level (Shoulder Row) Level 4 (Blue)   Retraction Strengthening;Both;10 reps  10 sec hold with noodle    Theraband Level (Shoulder Retraction) Level 2 (Red)   Shoulder Elevation Strengthening;Both;10 reps;Standing  2#   Other Standing Exercises elbow flexion palm up 10x2sets 2#  pronates wrist when he tries this with a weight   Shoulder Exercises: Therapy Ball   Flexion --  ball on wall rolling and bouncing overhead   Flexion Limitations stretching into flexion 20 sec hold 3 reps bilat shd  Shoulder Exercises: ROM/Strengthening   UBE (Upper Arm Bike) L4 4' 2 fwd/2back   Shoulder Exercises: Stretch   Corner Stretch Limitations doorway 3 positions 3 reps 20-30 sec hold   Shoulder Exercises: Body Blade   Flexion 30 seconds;3 reps   Cryotherapy   Number Minutes Cryotherapy 15 Minutes   Cryotherapy Location Shoulder;Upper arm;Cervical   Type of Cryotherapy Ice pack  patient's home traction with ice pack                PT Education - 05/16/15 0947    Education provided Yes   Education Details HEP for thoracic and trunk stabilization; patient instructed in use of his wife's home cervical traction unit 18-20# 10-12 min  1-2 x/day   Person(s) Educated Patient   Methods Explanation;Demonstration;Tactile cues;Verbal cues;Handout   Comprehension Verbalized understanding;Returned demonstration;Verbal cues required;Tactile cues required             PT Long Term Goals - 05/09/15 0937    PT LONG TERM GOAL #1   Title Increase Rt UE strength to =/> 4+/5 throughout 06/15/15   Time 6   Period Weeks   Status On-going   PT LONG TERM GOAL #2   Title Imcrease cervical ROM/mobility by 5-10 degrees in rotation and lateral flexion 06/15/15   Time 6   Period Weeks   Status On-going   PT LONG TERM GOAL #3   Title Improve posture and alignment with patient to demonstrate good position for head and shoulders in plum line 06/15/15   Time 6   Period Weeks   Status On-going   PT LONG TERM GOAL #4   Title Improve body mechanics with less patterns of substitution and abnormal movement patterns with elevation of Rt UE 06/15/15   Time 6   Period Weeks   Status On-going   PT LONG TERM GOAL #5   Title Improve FOTO to </= 24% limitation 06/15/15   Time 6   Period Weeks   Status On-going               Plan - 05/16/15 0949    Clinical Impression Statement Pt demonstrates small increase in shd/elbow strength; no change in ROM. Progressing gradually toward stated goals of therapy. He will add use of wife's home cervical traction unit for home   Pt will benefit from skilled therapeutic intervention in order to improve on the following deficits Decreased strength;Decreased range of motion;Decreased mobility;Increased fascial restricitons;Decreased activity tolerance;Decreased endurance;Postural dysfunction;Improper body mechanics   Rehab Potential Good   PT Frequency 2x / week   PT Duration 6 weeks   PT Treatment/Interventions Patient/family education;ADLs/Self Care Home Management;Therapeutic exercise;Therapeutic activities;Manual techniques;Dry needling;Cryotherapy;Electrical Stimulation;Moist  Heat;Ultrasound;Traction   PT Next Visit Plan continue work on posture and alignment; thoracic mobilization and stretching; axial extension(which created some intermittent tingling Rt UE); stretching; postural strengtheing; UE strengthening   PT Home Exercise Plan ; home cervical tractioin unit   Consulted and Agree with Plan of Care Patient        Problem List Patient Active Problem List   Diagnosis Date Noted  . 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 Hyperlipidemia 04/15/2014  . History of bowel resection 04/14/2014  . Testosterone deficiency 04/14/2014  . DIARRHEA 03/14/2010  . ABDOMINAL PAIN, GENERALIZED 03/14/2010  . VARICES OF OTHER SITES 10/04/2009  . IMPAIRED FASTING GLUCOSE 07/24/2009  . PARONYCHIA, FINGER 07/13/2009  . SKIN RASH 07/13/2009  . ELEVATED PROSTATE SPECIFIC ANTIGEN 04/10/2009  . VIRAL URI 05/09/2008  . CELLULITIS AND  ABSCESS OF OTHER SPECIFIED SITE 05/09/2008  . CHRONIC RHINITIS 06/04/2007  . 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 HYPOGONADISM, MALE 11/04/2006  . DISORDER, DEPRESSIVE NEC 11/04/2006  . HYPERLIPIDEMIA 10/07/2006  . ACNE ROSACEA 10/07/2006  . LIBIDO, DECREASED 10/07/2006    Scotlynn Noyes Nilda Simmer PT, MPH 05/16/2015, 9:59 AM  [facility] [place], [place], [address] Phone: [phone]   Fax:  [phone]  Name: GRADYN SHEIN MRN: [id number] Date of Birth: Nov 09, 1950

## 2015-05-16 NOTE — Patient Instructions (Signed)
Shoulder Blade Squeeze: Fingers Interlaced    Fingers interlaced behind your body, palms facing each other. Press pelvis down. Squeeze backbone with shoulder blades. Raise front of shoulders, chest, and head. Keep neck neutral. Hold _3-5__ seconds. Relax. Repeat _10__ times. 1-3 sets    Shoulder Blade Squeeze: Airplane    Arms out to sides at 90, elbows straight, palms down. Press pelvis down. Squeeze backbone with shoulder blades. Raise arms, front of shoulders, chest, and head. Keep neck neutral. Hold _3-5__ seconds. Relax. Repeat _10_ times. 1-3 sets   Shoulder Blade Squeeze: W    Arms out to sides at 90 palms down. Bend elbows to 90. Press pelvis down. Squeeze backbone with shoulder blades. Raise arms, front of shoulders, chest, and head. Keep neck neutral. Hold _3-5__ seconds. Relax. Repeat _10__ times. 1-3 sets

## 2015-05-18 ENCOUNTER — Ambulatory Visit (INDEPENDENT_AMBULATORY_CARE_PROVIDER_SITE_OTHER): Payer: BC Managed Care – PPO | Admitting: Physical Therapy

## 2015-05-18 DIAGNOSIS — R293 Abnormal posture: Secondary | ICD-10-CM

## 2015-05-18 DIAGNOSIS — R29898 Other symptoms and signs involving the musculoskeletal system: Secondary | ICD-10-CM | POA: Diagnosis not present

## 2015-05-18 DIAGNOSIS — M436 Torticollis: Secondary | ICD-10-CM

## 2015-05-18 DIAGNOSIS — R531 Weakness: Secondary | ICD-10-CM

## 2015-05-18 DIAGNOSIS — Z7409 Other reduced mobility: Secondary | ICD-10-CM

## 2015-05-18 DIAGNOSIS — R6889 Other general symptoms and signs: Secondary | ICD-10-CM

## 2015-05-18 NOTE — Therapy (Signed)
[place], [place], [address] Phone: [phone]   Fax:  [phone]  Physical Therapy Treatment  Patient Details  Name: Marc Lowe MRN: [id number] Date of Birth: 1951/02/15 Referring Provider: Dr. Dianah Field  Encounter Date: 05/18/2015      PT End of Session - 05/18/15 0804    Visit Number 5   Number of Visits 12   Date for PT Re-Evaluation 06/15/15   PT Start Time 0805   PT Stop Time 0901   PT Time Calculation (min) 56 min   Activity Tolerance Patient tolerated treatment well      Past Medical History  Diagnosis Date  . Diverticulitis     No past surgical history on file.  There were no vitals filed for this visit.  Visit Diagnosis:  Abnormal posture  Weakness of left upper extremity  Stiffness of cervical spine  Decreased strength, endurance, and mobility      Subjective Assessment - 05/18/15 0805    Subjective Pt reports he just "can't get comfortable" during home traction.  Supervising PT recommended less pull to decrease symptoms.  Otherwise, nothing new to report.     Currently in Pain? No/denies            [facility] PT Assessment - 05/18/15 0001    Assessment   Medical Diagnosis Cervical impingment - Rt UE weakness         [facility] Adult PT Treatment/Exercise - 05/18/15 0001    Shoulder Exercises: Prone   Extension Strengthening;Both;10 reps  with axial extension    Other Prone Exercises Scap squeeze with goal post arms and axial extension   Shoulder Exercises: Standing   External Rotation Both;Strengthening;Theraband;20 reps   Theraband Level (Shoulder External Rotation) Level 3 (Green)   Shoulder Exercises: Therapy Ball   Flexion --  ball on wall rolling and bouncing overhead   Flexion Limitations stretching into flexion 20 sec hold 3 reps bilat shd   Shoulder Exercises: ROM/Strengthening   UBE (Upper Arm Bike) L4: alternating forward backward each min.  total of 5 min   Shoulder Exercises: Stretch   Corner Stretch Limitations doorway 3 positions 3 reps 20-30 sec hold   Cryotherapy   Number Minutes Cryotherapy 15 Minutes, to cervical and thoracic area.    Manual Therapy   Manual therapy comments pt hooklying head supported on pillow   Soft tissue mobilization anterior/lateral/posterior cervical musculature/upper trap/leveator/pecs   Myofascial Release ant chest,  suboccipital release 30 sec x 3 reps    Neck Exercises: Stretches   Upper Trapezius Stretch 2 reps;30 seconds   Levator Stretch 2 reps;30 seconds                PT Education - 05/18/15 0853    Education provided Yes   Education Details HEP- added upper trap stretch   Person(s) Educated Patient   Methods Explanation;Demonstration   Comprehension Returned demonstration;Verbalized understanding             PT Long Term Goals - 05/09/15 0937    PT LONG TERM GOAL #1   Title Increase Rt UE strength to =/> 4+/5 throughout 06/15/15   Time 6   Period Weeks   Status On-going   PT LONG TERM GOAL #2   Title Imcrease cervical ROM/mobility by 5-10 degrees in rotation and lateral flexion 06/15/15   Time 6   Period Weeks   Status On-going   PT LONG TERM GOAL #3   Title Improve posture  and alignment with patient to demonstrate good position for head and shoulders in plum line 06/15/15   Time 6   Period Weeks   Status On-going   PT LONG TERM GOAL #4   Title Improve body mechanics with less patterns of substitution and abnormal movement patterns with elevation of Rt UE 06/15/15   Time 6   Period Weeks   Status On-going   PT LONG TERM GOAL #5   Title Improve FOTO to </= 24% limitation 06/15/15   Time 6   Period Weeks   Status On-going               Plan - 05/18/15 0836    Clinical Impression Statement Pt tolerated new exercises without increase in pain or symptoms; required some tactile cues for form. Pt had mild increased symptoms into RUE with manual  therapy to periscapular / upper trap in supine (head neutral) as well as Lt cervical rotation in supine; reduced with head placed in neutral and also with ice at end of session.   Progressing towards goals.    Pt will benefit from skilled therapeutic intervention in order to improve on the following deficits Decreased strength;Decreased range of motion;Decreased mobility;Increased fascial restricitons;Decreased activity tolerance;Decreased endurance;Postural dysfunction;Improper body mechanics   PT Frequency 2x / week   PT Duration 6 weeks   PT Treatment/Interventions Patient/family education;ADLs/Self Care Home Management;Therapeutic exercise;Therapeutic activities;Manual techniques;Dry needling;Cryotherapy;Electrical Stimulation;Moist Heat;Ultrasound;Traction   PT Next Visit Plan continue work on posture and alignment; thoracic mobilization and stretching; axial extension(which created some intermittent tingling Rt UE); stretching; postural strengtheing; UE strengthening   Consulted and Agree with Plan of Care Patient        Problem List Patient Active Problem List   Diagnosis Date Noted  . 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 Hyperlipidemia 04/15/2014  . History of bowel resection 04/14/2014  . Testosterone deficiency 04/14/2014  . DIARRHEA 03/14/2010  . ABDOMINAL PAIN, GENERALIZED 03/14/2010  . VARICES OF OTHER SITES 10/04/2009  . IMPAIRED FASTING GLUCOSE 07/24/2009  . PARONYCHIA, FINGER 07/13/2009  . SKIN RASH 07/13/2009  . ELEVATED PROSTATE SPECIFIC ANTIGEN 04/10/2009  . VIRAL URI 05/09/2008  . CELLULITIS AND ABSCESS OF OTHER SPECIFIED SITE 05/09/2008  . CHRONIC RHINITIS 06/04/2007  . 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 HYPOGONADISM, MALE 11/04/2006  . DISORDER, DEPRESSIVE NEC 11/04/2006  . HYPERLIPIDEMIA 10/07/2006  . ACNE ROSACEA 10/07/2006  . [name] 10/07/2006    Kerin Perna, PTA 05/18/2015 8:58 AM  [facility] [place], [place], [address] Phone: [phone]   Fax:  [phone]  Name: IZAAH WESTMAN MRN: [id number] Date of Birth: April 26, 1951

## 2015-05-22 ENCOUNTER — Ambulatory Visit (INDEPENDENT_AMBULATORY_CARE_PROVIDER_SITE_OTHER): Payer: BC Managed Care – PPO | Admitting: Physical Therapy

## 2015-05-22 DIAGNOSIS — R531 Weakness: Secondary | ICD-10-CM

## 2015-05-22 DIAGNOSIS — M436 Torticollis: Secondary | ICD-10-CM | POA: Diagnosis not present

## 2015-05-22 DIAGNOSIS — R293 Abnormal posture: Secondary | ICD-10-CM | POA: Diagnosis not present

## 2015-05-22 DIAGNOSIS — Z7409 Other reduced mobility: Secondary | ICD-10-CM | POA: Diagnosis not present

## 2015-05-22 DIAGNOSIS — R29898 Other symptoms and signs involving the musculoskeletal system: Secondary | ICD-10-CM

## 2015-05-22 NOTE — Therapy (Signed)
Marc [place], [place], [address] Phone: [phone]   Fax:  [phone]  Physical Therapy Treatment  Patient Details  Name: Marc Lowe MRN: [id number] Date of Birth: Nov 18, 1950 Referring Provider: Dr. Dianah Field  Encounter Date: 05/22/2015      PT End of Session - 05/22/15 0849    Visit Number 6   Number of Visits 12   Date for PT Re-Evaluation 06/15/15   PT Start Time 0849   PT Stop Time 0940   PT Time Calculation (min) 51 min   Activity Tolerance Patient tolerated treatment well      Past Medical History  Diagnosis Date  . Diverticulitis     No past surgical history on file.  There were no vitals filed for this visit.  Visit Diagnosis:  Abnormal posture  Weakness of left upper extremity  Stiffness of cervical spine  Decreased strength, endurance, and mobility      Subjective Assessment - 05/22/15 0852    Subjective Pt reports he decreased the pull on his home traction unit.  Pt reports his tingling his RUE has decreased some since starting therapy.  "I'm just weak as hell".    Currently in Pain? No/denies         [facility] Adult PT Treatment/Exercise - 05/22/15 0001    Neck Exercises: Supine   Other Supine Exercise chin tucks with 5 sec hold x 10 reps    Shoulder Exercises: Seated   Other Seated Exercises elbow flexion with elbow supported by table, 5 reps x 3#, 10 reps x 2# (challenging)    Shoulder Exercises: Standing   External Rotation Both;Strengthening;Theraband;20 reps   Theraband Level (Shoulder External Rotation) Level 3 (Green)   ABduction Strengthening;Weights;Right;10 reps  scaption, 2 sets   Shoulder ABduction Weight (lbs) 3#   Row Strengthening;Both;Theraband;12 reps   Theraband Level (Shoulder Row) Level 4 (Blue);Level 3 (Green)   Other Standing Exercises Elbow extension with green band x 10 reps, blue band x 10 reps (shoulder flexed to ~110 deg)   Shoulder Exercises: ROM/Strengthening   UBE (Upper Arm Bike) L4: alternating forward backward each min. total of 5 min   Shoulder Exercises: Stretch   Corner Stretch Limitations doorway 3 positions 3 reps 20-30 sec hold   Cryotherapy   Number Minutes Cryotherapy 12 Minutes   Cryotherapy Location Cervical  upper thoracic   Type of Cryotherapy Ice pack   Manual Therapy   Manual therapy comments pt hooklying head supported on pillow   Soft tissue mobilization anterior/lateral/posterior cervical musculature/upper trap/scalenes with passive cervical lateral flexion, rotation   Myofascial Release ant chest,  suboccipital release 30 sec x 6 reps                      PT Long Term Goals - 05/09/15 0937    PT LONG TERM GOAL #1   Title Increase Rt UE strength to =/> 4+/5 throughout 06/15/15   Time 6   Period Weeks   Status On-going   PT LONG TERM GOAL #2   Title Imcrease cervical ROM/mobility by 5-10 degrees in rotation and lateral flexion 06/15/15   Time 6   Period Weeks   Status On-going   PT LONG TERM GOAL #3   Title Improve posture and alignment with patient to demonstrate good position for head and shoulders in plum line 06/15/15   Time 6   Period Weeks   Status On-going   PT LONG TERM GOAL #  4   Title Improve body mechanics with less patterns of substitution and abnormal movement patterns with elevation of Rt UE 06/15/15   Time 6   Period Weeks   Status On-going   PT LONG TERM GOAL #5   Title Improve FOTO to </= 24% limitation 06/15/15   Time 6   Period Weeks   Status On-going               Plan - 05/22/15 0931    Clinical Impression Statement Pt tolerated exercises well without increase in pain or symptoms.  Pt did not have any symptoms with manual therapy, as he did last session. Continues with weakness in Rt shoulder/ elbow.  Progressing towards goals.    Pt will benefit from skilled therapeutic intervention in order to improve on the following deficits  Decreased strength;Decreased range of motion;Decreased mobility;Increased fascial restricitons;Decreased activity tolerance;Decreased endurance;Postural dysfunction;Improper body mechanics   Rehab Potential Good   PT Frequency 2x / week   PT Duration 6 weeks   PT Treatment/Interventions Patient/family education;ADLs/Self Care Home Management;Therapeutic exercise;Therapeutic activities;Manual techniques;Dry needling;Cryotherapy;Electrical Stimulation;Moist Heat;Ultrasound;Traction   PT Next Visit Plan Continue progressive strengthening of RUE, postural strengthening, manual therapy.    Consulted and Agree with Plan of Care Patient        Problem List Patient Active Problem List   Diagnosis Date Noted  . 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 Hyperlipidemia 04/15/2014  . History of bowel resection 04/14/2014  . Testosterone deficiency 04/14/2014  . DIARRHEA 03/14/2010  . ABDOMINAL PAIN, GENERALIZED 03/14/2010  . VARICES OF OTHER SITES 10/04/2009  . IMPAIRED FASTING GLUCOSE 07/24/2009  . PARONYCHIA, FINGER 07/13/2009  . SKIN RASH 07/13/2009  . ELEVATED PROSTATE SPECIFIC ANTIGEN 04/10/2009  . VIRAL URI 05/09/2008  . CELLULITIS AND ABSCESS OF OTHER SPECIFIED SITE 05/09/2008  . CHRONIC RHINITIS 06/04/2007  . 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 HYPOGONADISM, MALE 11/04/2006  . DISORDER, DEPRESSIVE NEC 11/04/2006  . HYPERLIPIDEMIA 10/07/2006  . ACNE ROSACEA 10/07/2006  . Marc Lowe 10/07/2006   Kerin Perna, PTA 05/22/2015 12:42 PM  [place], [place], [address] Phone: [phone]   Fax:  [phone]  Name: Marc Lowe MRN: [id number] Date of Birth: 1950/09/25

## 2015-05-25 ENCOUNTER — Ambulatory Visit (INDEPENDENT_AMBULATORY_CARE_PROVIDER_SITE_OTHER): Payer: BC Managed Care – PPO | Admitting: Physical Therapy

## 2015-05-25 DIAGNOSIS — R293 Abnormal posture: Secondary | ICD-10-CM

## 2015-05-25 DIAGNOSIS — R29898 Other symptoms and signs involving the musculoskeletal system: Secondary | ICD-10-CM | POA: Diagnosis not present

## 2015-05-25 DIAGNOSIS — R531 Weakness: Secondary | ICD-10-CM

## 2015-05-25 DIAGNOSIS — Z7409 Other reduced mobility: Secondary | ICD-10-CM | POA: Diagnosis not present

## 2015-05-25 DIAGNOSIS — M436 Torticollis: Secondary | ICD-10-CM

## 2015-05-25 DIAGNOSIS — R6889 Other general symptoms and signs: Secondary | ICD-10-CM

## 2015-05-25 NOTE — Therapy (Signed)
[address] [place], [place], [address] Phone: [phone]   Fax:  [phone]  Physical Therapy Treatment  Patient Details  Name: Marc Lowe MRN: [id number] Date of Birth: [birth year] Referring Provider: Dr. Pollyann Glen  Encounter Date: 05/25/2015      PT End of Session - 05/25/15 0902    Visit Number 7   Number of Visits 12   Date for PT Re-Evaluation 06/15/15   PT Start Time 0850   PT Stop Time 0947   PT Time Calculation (min) 57 min      Past Medical History  Diagnosis Date  . Diverticulitis     No past surgical history on file.  There were no vitals filed for this visit.  Visit Diagnosis:  Abnormal posture  Weakness of left upper extremity  Stiffness of cervical spine  Decreased strength, endurance, and mobility      Subjective Assessment - 05/25/15 0903    Subjective Pt reports he has had very little tingling in RUE; pleased with this.  Continues to perform ice on neck and traction at home each evening.    Currently in Pain? No/denies            [facility] PT Assessment - 05/25/15 0001    Assessment   Medical Diagnosis Cervical impingment - Rt UE weakness   Referring Provider Dr. Pollyann Glen   Onset Date/Surgical Date 11/28/14   Hand Dominance Right   AROM   Cervical Flexion 60   Cervical Extension 52   Cervical - Right Side Bend 35   Cervical - Left Side Bend 27   Cervical - Right Rotation 70   Cervical - Left Rotation 58  tightness reported in Rt shoulder           [facility] Adult PT Treatment/Exercise - 05/25/15 0001    Elbow Exercises   Elbow Flexion Strengthening;Right;Standing  6 reps 4#, 8 reps 3#, 8 reps 2#   Shoulder Exercises: Standing   External Rotation Both;10 reps;Theraband   Theraband Level (Shoulder External Rotation) Level 3 (Green)   Flexion Right;10 reps;Weights   Shoulder Flexion Weight (lbs) 3#   ABduction Strengthening;Weights;Right;10  reps  scaption, 2 sets   Shoulder ABduction Weight (lbs) 3#   Extension Both;Theraband;20 reps   Theraband Level (Shoulder Extension) Level 3 (Green)   Row Strengthening;Both;20 reps   Theraband Level (Shoulder Row) Level 3 (Green)   Shoulder Exercises: ROM/Strengthening   UBE (Upper Arm Bike) L4: alternating forward backward each min. total of 5 min   Shoulder Exercises: Stretch   Corner Stretch Limitations doorway 3 positions 3 reps 20-30 sec hold   Cryotherapy   Number Minutes Cryotherapy 12 Minutes   Cryotherapy Location Cervical  upper thoracic   Type of Cryotherapy Ice pack   Manual Therapy   Manual therapy comments pt hooklying head supported on pillow   Soft tissue mobilization anterior/lateral/posterior cervical musculature/upper trap/scalenes with passive cervical lateral flexion, rotation   Myofascial Release ant chest,  suboccipital release 30 sec x 6 reps    Neck Exercises: Stretches   Upper Trapezius Stretch 2 reps;30 seconds   Levator Stretch 2 reps;30 seconds                     PT Long Term Goals - 05/09/15 0937    PT LONG TERM GOAL #1   Title Increase Rt UE strength to =/> 4+/5 throughout 06/15/15   Time 6   Period Weeks   Status  On-going   PT LONG TERM GOAL #2   Title Imcrease cervical ROM/mobility by 5-10 degrees in rotation and lateral flexion 06/15/15   Time 6   Period Weeks   Status On-going   PT LONG TERM GOAL #3   Title Improve posture and alignment with patient to demonstrate good position for head and shoulders in plum line 06/15/15   Time 6   Period Weeks   Status On-going   PT LONG TERM GOAL #4   Title Improve body mechanics with less patterns of substitution and abnormal movement patterns with elevation of Rt UE 06/15/15   Time 6   Period Weeks   Status On-going   PT LONG TERM GOAL #5   Title Improve FOTO to </= 24% limitation 06/15/15   Time 6   Period Weeks   Status On-going               Plan - 05/25/15 1004     Clinical Impression Statement Pt's neck ROM has increased in flex/ext; remains limited in Lt rotation and lateral flexion.  Pt reports tightness down lateral portion of Rt shoulder/deltoid when attempting these motions.  Pt continues with weakness in Rt elbow flexors, tolerated exercises without symptoms into RUE.  Progressing towards goals.    Pt will benefit from skilled therapeutic intervention in order to improve on the following deficits Decreased strength;Decreased range of motion;Decreased mobility;Increased fascial restricitons;Decreased activity tolerance;Decreased endurance;Postural dysfunction;Improper body mechanics   Rehab Potential Good   PT Frequency 2x / week   PT Duration 6 weeks   PT Treatment/Interventions Patient/family education;ADLs/Self Care Home Management;Therapeutic exercise;Therapeutic activities;Manual techniques;Dry needling;Cryotherapy;Electrical Stimulation;Moist Heat;Ultrasound;Traction   PT Next Visit Plan Trial of estim for muscle re-ed to elbow flexors, IASTM to Rt neck/shoulder girdle. Deep neck flexor strengthening.    Consulted and Agree with Plan of Care Patient        Problem List Patient Active Problem List   Diagnosis Date Noted  . 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 Hyperlipidemia 04/15/2014  . History of bowel resection 04/14/2014  . Testosterone deficiency 04/14/2014  . DIARRHEA 03/14/2010  . ABDOMINAL PAIN, GENERALIZED 03/14/2010  . VARICES OF OTHER SITES 10/04/2009  . IMPAIRED FASTING GLUCOSE 07/24/2009  . PARONYCHIA, FINGER 07/13/2009  . SKIN RASH 07/13/2009  . ELEVATED PROSTATE SPECIFIC ANTIGEN 04/10/2009  . VIRAL URI 05/09/2008  . CELLULITIS AND ABSCESS OF OTHER SPECIFIED SITE 05/09/2008  . CHRONIC RHINITIS 06/04/2007  . 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 HYPOGONADISM, MALE 11/04/2006  . DISORDER, DEPRESSIVE NEC 11/04/2006  . HYPERLIPIDEMIA 10/07/2006  . ACNE ROSACEA 10/07/2006  . [name] 10/07/2006     Kerin Perna, PTA 05/25/2015 1:28 PM  [place], [place], [address] Phone: [phone]   Fax:  [phone]  Name: Marc Lowe MRN: [id number] Date of Birth: [birth year]

## 2015-05-29 ENCOUNTER — Encounter: Payer: Self-pay | Admitting: Rehabilitative and Restorative Service Providers"

## 2015-05-29 ENCOUNTER — Ambulatory Visit (INDEPENDENT_AMBULATORY_CARE_PROVIDER_SITE_OTHER): Payer: BC Managed Care – PPO | Admitting: Rehabilitative and Restorative Service Providers"

## 2015-05-29 DIAGNOSIS — Z7409 Other reduced mobility: Secondary | ICD-10-CM

## 2015-05-29 DIAGNOSIS — M436 Torticollis: Secondary | ICD-10-CM

## 2015-05-29 DIAGNOSIS — R531 Weakness: Secondary | ICD-10-CM

## 2015-05-29 DIAGNOSIS — R29898 Other symptoms and signs involving the musculoskeletal system: Secondary | ICD-10-CM | POA: Diagnosis not present

## 2015-05-29 DIAGNOSIS — R293 Abnormal posture: Secondary | ICD-10-CM

## 2015-05-29 DIAGNOSIS — R6889 Other general symptoms and signs: Secondary | ICD-10-CM

## 2015-05-29 NOTE — Patient Instructions (Signed)
AROM: Elbow Flexion / Extension    With left hand palm up, bend elbow pause. Then straighten arm. Repeat __10__ times per set. Do _1-3___ sets per session. Do 1-2 ____ sessions per day. Repeat with thumb up   Forearm Pronation / Supination: Resisted (Sitting)    With right forearm supported, grasp object and rotate palm up, then down Repeat _10___ times per set. Do _1-3_ sets per session. Do _1-2___ sessions per day. ELBOW: Wall Push-Up    With arms slightly wider than shoulders, gently lean body to wall. Push body away from wall by straightening elbows. _10__ reps per set, _1-3__ sets per day

## 2015-05-29 NOTE — Therapy (Signed)
[place], [place], [address] Phone: [phone]   Fax:  [phone]  Physical Therapy Treatment  Patient Details  Name: Marc Lowe MRN: [id number] Date of Birth: 02-22-1951 Referring Provider: Dr Darene Lamer  Encounter Date: 05/29/2015      PT End of Session - 05/29/15 0851    Visit Number 8   Number of Visits 12   Date for PT Re-Evaluation 06/15/15   PT Start Time 0848   PT Stop Time 0936   PT Time Calculation (min) 48 min   Activity Tolerance Patient tolerated treatment well      Past Medical History  Diagnosis Date  . Diverticulitis     History reviewed. No pertinent past surgical history.  There were no vitals filed for this visit.  Visit Diagnosis:  Abnormal posture  Weakness of left upper extremity  Stiffness of cervical spine  Decreased strength, endurance, and mobility      Subjective Assessment - 05/29/15 0855    Subjective Tingling has resolved - only has it occasionally. He is still using the cervical traction. Working on Hotel manager.    Currently in Pain? No/denies            [facility] PT Assessment - 05/29/15 0001    Assessment   Medical Diagnosis Cervical impingment - Rt UE weakness   Referring Provider Dr T   Onset Date/Surgical Date 11/28/14   Hand Dominance Right   Strength   Right Shoulder Flexion 4+/5   Right Shoulder Extension 5/5   Right Shoulder ABduction --  5-/5   Right Shoulder Internal Rotation 5/5   Right Shoulder External Rotation 5/5   Right Shoulder Horizontal ABduction 5/5  5-/5   Right Elbow Flexion 4/5   Right Elbow Extension 5/5                     [facility] Adult PT Treatment/Exercise - 05/29/15 0001    Neck Exercises: Standing   Other Standing Exercises scap squeeze with noodle 10 reps 10 sec hold   Shoulder Exercises: Standing   External Rotation Left;20 reps;Theraband   Theraband Level (Shoulder External Rotation)  Level 3 (Green)   Flexion Right;10 reps;Weights   Shoulder Flexion Weight (lbs) 3#   ABduction Strengthening;Weights;Right;10 reps  scaption, 2 sets   Shoulder ABduction Weight (lbs) 3#   Extension Both;Theraband;20 reps   Theraband Level (Shoulder Extension) Level 4 (Blue)   Row Strengthening;Both;20 reps   Theraband Level (Shoulder Row) Level 4 (Blue)   Retraction Strengthening;Both;10 reps   Theraband Level (Shoulder Retraction) Level 2 (Red)   Other Standing Exercises elbow flexion 1 # x 10  biceps and biceps brachii 10 each   Other Standing Exercises supination/pronation 1# x10 standing w/noodle    Shoulder Exercises: ROM/Strengthening   UBE (Upper Arm Bike) L5: alternating forward backward each min. total of 5 min   Shoulder Exercises: Stretch   Corner Stretch Limitations doorway 3 positions 3 reps 20-30 sec hold   Cryotherapy   Number Minutes Cryotherapy 12 Minutes   Cryotherapy Location Cervical  upper thoracic   Type of Cryotherapy Ice pack   Manual Therapy   Manual therapy comments pt hooklying head supported on pillow   Soft tissue mobilization anterior/lateral/posterior cervical musculature/upper trap/scalenes with passive cervical lateral flexion, rotation   Myofascial Release ant chest,  suboccipital release 30 sec x 6 reps  PT Education - 05/29/15 0920    Education provided Yes   Education Details HEP added strengthening; increaed resistance; issued blue TB for home    Person(s) Educated Patient   Methods Explanation;Demonstration;Tactile cues;Verbal cues;Handout   Comprehension Verbalized understanding;Returned demonstration;Verbal cues required;Tactile cues required             PT Long Term Goals - 05/09/15 0937    PT LONG TERM GOAL #1   Title Increase Rt UE strength to =/> 4+/5 throughout 06/15/15   Time 6   Period Weeks   Status On-going   PT LONG TERM GOAL #2   Title Imcrease cervical ROM/mobility by 5-10 degrees in  rotation and lateral flexion 06/15/15   Time 6   Period Weeks   Status On-going   PT LONG TERM GOAL #3   Title Improve posture and alignment with patient to demonstrate good position for head and shoulders in plum line 06/15/15   Time 6   Period Weeks   Status On-going   PT LONG TERM GOAL #4   Title Improve body mechanics with less patterns of substitution and abnormal movement patterns with elevation of Rt UE 06/15/15   Time 6   Period Weeks   Status On-going   PT LONG TERM GOAL #5   Title Improve FOTO to </= 24% limitation 06/15/15   Time 6   Period Weeks   Status On-going               Plan - 05/29/15 0931    Clinical Impression Statement Improving with exercise tolerance. Some gains in Rt UE strength noted. Functionally improving. Improvement in tingling Rt UE. Weakness continues Rt UE.    Pt will benefit from skilled therapeutic intervention in order to improve on the following deficits Decreased strength;Decreased range of motion;Decreased mobility;Increased fascial restricitons;Decreased activity tolerance;Decreased endurance;Postural dysfunction;Improper body mechanics   Rehab Potential Good   PT Frequency 2x / week   PT Duration 6 weeks   PT Treatment/Interventions Patient/family education;ADLs/Self Care Home Management;Therapeutic exercise;Therapeutic activities;Manual techniques;Dry needling;Cryotherapy;Electrical Stimulation;Moist Heat;Ultrasound;Traction   PT Next Visit Plan Trial of estim for muscle re-ed to elbow flexors, IASTM to Rt neck/shoulder girdle. Deep neck flexor strengthening.    PT Home Exercise Plan ; home cervical tractioin unit   Consulted and Agree with Plan of Care Patient        Problem List Patient Active Problem List   Diagnosis Date Noted  . 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 Hyperlipidemia 04/15/2014  . History of bowel resection 04/14/2014  . Testosterone deficiency 04/14/2014  . DIARRHEA 03/14/2010  . ABDOMINAL PAIN,  GENERALIZED 03/14/2010  . VARICES OF OTHER SITES 10/04/2009  . IMPAIRED FASTING GLUCOSE 07/24/2009  . PARONYCHIA, FINGER 07/13/2009  . SKIN RASH 07/13/2009  . ELEVATED PROSTATE SPECIFIC ANTIGEN 04/10/2009  . VIRAL URI 05/09/2008  . CELLULITIS AND ABSCESS OF OTHER SPECIFIED SITE 05/09/2008  . CHRONIC RHINITIS 06/04/2007  . 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 HYPOGONADISM, MALE 11/04/2006  . DISORDER, DEPRESSIVE NEC 11/04/2006  . HYPERLIPIDEMIA 10/07/2006  . ACNE ROSACEA 10/07/2006  . LIBIDO, DECREASED 10/07/2006    Vidur Knust Nilda Simmer PT, MPH 05/29/2015, 9:35 AM  [facility] [place], [place], [address] Phone: [phone]   Fax:  [phone]  Name: Marc Lowe MRN: [id number] Date of Birth: [birth year]

## 2015-06-01 ENCOUNTER — Ambulatory Visit (INDEPENDENT_AMBULATORY_CARE_PROVIDER_SITE_OTHER): Payer: BC Managed Care – PPO | Admitting: Rehabilitative and Restorative Service Providers"

## 2015-06-01 ENCOUNTER — Encounter: Payer: Self-pay | Admitting: Rehabilitative and Restorative Service Providers"

## 2015-06-01 DIAGNOSIS — R29898 Other symptoms and signs involving the musculoskeletal system: Secondary | ICD-10-CM

## 2015-06-01 DIAGNOSIS — R6889 Other general symptoms and signs: Secondary | ICD-10-CM

## 2015-06-01 DIAGNOSIS — R293 Abnormal posture: Secondary | ICD-10-CM

## 2015-06-01 DIAGNOSIS — M436 Torticollis: Secondary | ICD-10-CM | POA: Diagnosis not present

## 2015-06-01 DIAGNOSIS — Z7409 Other reduced mobility: Secondary | ICD-10-CM

## 2015-06-01 DIAGNOSIS — R531 Weakness: Secondary | ICD-10-CM

## 2015-06-01 NOTE — Therapy (Signed)
[place], [place], [address] Phone: [phone]   Fax:  [phone]  Physical Therapy Treatment  Patient Details  Name: Marc Lowe MRN: [id number] Date of Birth: 1950-12-25 Referring Provider: Dr Darene Lamer  Encounter Date: 06/01/2015      PT End of Session - 06/01/15 0852    Visit Number 9   Number of Visits 12   Date for PT Re-Evaluation 06/15/15   PT Start Time 0849   PT Stop Time 0942   PT Time Calculation (min) 53 min   Activity Tolerance Patient tolerated treatment well      Past Medical History  Diagnosis Date  . Diverticulitis     History reviewed. No pertinent past surgical history.  There were no vitals filed for this visit.  Visit Diagnosis:  Abnormal posture  Weakness of left upper extremity  Stiffness of cervical spine  Decreased strength, endurance, and mobility      Subjective Assessment - 06/01/15 0853    Subjective Can tell he is getting a biceps muscle "a little bit" when he flexes his elbow. "This therapy may be working!"   Currently in Pain? No/denies                         [facility] Adult PT Treatment/Exercise - 06/01/15 0001    Elbow Exercises   Elbow Flexion Strengthening;Right;Standing  4# x6; 3# x4; 2# x8   Neck Exercises: Standing   Other Standing Exercises scap squeeze with noodle 10 reps 10 sec hold   Shoulder Exercises: Standing   Protraction Limitations elbow curl with dowel 2# wt x20   External Rotation Left;20 reps;Theraband   Theraband Level (Shoulder External Rotation) Level 3 (Green)   Flexion Right;Weights;20 reps   Shoulder Flexion Weight (lbs) 4#  4#   ABduction Strengthening;20 reps;Weights   Shoulder ABduction Weight (lbs) 4#   Extension Both;Theraband;20 reps   Theraband Level (Shoulder Extension) Level 4 (Blue)   Corporate treasurer;Both  30   Theraband Level (Shoulder Row) Level 4 (Blue)   Retraction  Strengthening;Both;10 reps   Theraband Level (Shoulder Retraction) Level 2 (Red)   Other Standing Exercises elbow flexion brachialis thumb up 2 # x 10  biceps and biceps brachii 10 each   Other Standing Exercises supination/pronation 1# x10 standing w/noodle    Shoulder Exercises: Therapy Ball   Other Therapy Ball Exercises ball on wall overhead 1 min x3   Other Therapy Ball Exercises wall push ups small ball in Rt hand x 20    Shoulder Exercises: ROM/Strengthening   UBE (Upper Arm Bike) L6: alternating forward backward each min. total of 5 min   Shoulder Exercises: Stretch   Corner Stretch Limitations doorway 3 positions 3 reps 20-30 sec hold   Shoulder Exercises: Body Blade   Flexion 60 seconds;2 reps   Cryotherapy   Number Minutes Cryotherapy 15 Minutes   Cryotherapy Location Cervical  upper thoracic   Type of Cryotherapy Ice pack                     PT Long Term Goals - 05/09/15 0937    PT LONG TERM GOAL #1   Title Increase Rt UE strength to =/> 4+/5 throughout 06/15/15   Time 6   Period Weeks   Status On-going   PT LONG TERM GOAL #2   Title Imcrease cervical ROM/mobility by 5-10 degrees in rotation and lateral flexion 06/15/15  Time 6   Period Weeks   Status On-going   PT LONG TERM GOAL #3   Title Improve posture and alignment with patient to demonstrate good position for head and shoulders in plum line 06/15/15   Time 6   Period Weeks   Status On-going   PT LONG TERM GOAL #4   Title Improve body mechanics with less patterns of substitution and abnormal movement patterns with elevation of Rt UE 06/15/15   Time 6   Period Weeks   Status On-going   PT LONG TERM GOAL #5   Title Improve FOTO to </= 24% limitation 06/15/15   Time 6   Period Weeks   Status On-going               Plan - 06/01/15 0924    Clinical Impression Statement Continued improvement in posture and alignment; increasing resistance with resistive exercises. Progressing toward  stated goals of therapy.    Pt will benefit from skilled therapeutic intervention in order to improve on the following deficits Decreased strength;Decreased range of motion;Decreased mobility;Increased fascial restricitons;Decreased activity tolerance;Decreased endurance;Postural dysfunction;Improper body mechanics   Rehab Potential Good   PT Frequency 2x / week   PT Duration 6 weeks   PT Treatment/Interventions Patient/family education;ADLs/Self Care Home Management;Therapeutic exercise;Therapeutic activities;Manual techniques;Dry needling;Cryotherapy;Electrical Stimulation;Moist Heat;Ultrasound;Traction   PT Next Visit Plan Trial of estim for muscle re-ed to elbow flexors, IASTM to Rt neck/shoulder girdle. Deep neck flexor strengthening.    PT Home Exercise Plan continue HEP   Consulted and Agree with Plan of Care Patient        Problem List Patient Active Problem List   Diagnosis Date Noted  . 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 Hyperlipidemia 04/15/2014  . History of bowel resection 04/14/2014  . Testosterone deficiency 04/14/2014  . DIARRHEA 03/14/2010  . ABDOMINAL PAIN, GENERALIZED 03/14/2010  . VARICES OF OTHER SITES 10/04/2009  . IMPAIRED FASTING GLUCOSE 07/24/2009  . PARONYCHIA, FINGER 07/13/2009  . SKIN RASH 07/13/2009  . ELEVATED PROSTATE SPECIFIC ANTIGEN 04/10/2009  . VIRAL URI 05/09/2008  . CELLULITIS AND ABSCESS OF OTHER SPECIFIED SITE 05/09/2008  . CHRONIC RHINITIS 06/04/2007  . 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 HYPOGONADISM, MALE 11/04/2006  . DISORDER, DEPRESSIVE NEC 11/04/2006  . HYPERLIPIDEMIA 10/07/2006  . ACNE ROSACEA 10/07/2006  . LIBIDO, DECREASED 10/07/2006    Javari Bufkin Nilda Simmer PT, MPH 06/01/2015, 9:27 AM  [facility] [address] [place], [place], [address] Phone: [phone]   Fax:  [phone]  Name: THEODOROS HENIGE MRN: [id number] Date of Birth: Jan 17, 1951

## 2015-06-05 ENCOUNTER — Ambulatory Visit (INDEPENDENT_AMBULATORY_CARE_PROVIDER_SITE_OTHER): Payer: BC Managed Care – PPO | Admitting: Physical Therapy

## 2015-06-05 DIAGNOSIS — R29898 Other symptoms and signs involving the musculoskeletal system: Secondary | ICD-10-CM | POA: Diagnosis not present

## 2015-06-05 DIAGNOSIS — M436 Torticollis: Secondary | ICD-10-CM | POA: Diagnosis not present

## 2015-06-05 DIAGNOSIS — R293 Abnormal posture: Secondary | ICD-10-CM | POA: Diagnosis not present

## 2015-06-05 NOTE — Therapy (Signed)
[place], [place], [address] Phone: [phone]   Fax:  [phone]  Physical Therapy Treatment  Patient Details  Name: Marc Lowe MRN: [id number] Date of Birth: 13-Dec-1950 Referring Provider: Dr Darene Lamer  Encounter Date: 06/05/2015      PT End of Session - 06/05/15 0850    Visit Number 10   Number of Visits 12   Date for PT Re-Evaluation 06/15/15   PT Start Time 0849   PT Stop Time 0940   PT Time Calculation (min) 51 min      Past Medical History  Diagnosis Date  . Diverticulitis     No past surgical history on file.  There were no vitals filed for this visit.  Visit Diagnosis:  Abnormal posture  Weakness of left upper extremity  Stiffness of cervical spine                       [facility] Adult PT Treatment/Exercise - 06/05/15 0853    Neck Exercises: Standing   Upper Extremity D1 --  UBE   Other Standing Exercises scap squeeze with noodle 10 reps 10 sec hold   Other Standing Exercises black jb   Neck Exercises: Supine   Other Supine Exercise chin tuck   Shoulder Exercises: Supine   Other Supine Exercises thoroacic lift with elbows   Other Supine Exercises lie over Red ball to open chest, pects   Shoulder Exercises: Body Blade   Flexion 60 seconds   Cryotherapy   Number Minutes Cryotherapy 12 Minutes   Cryotherapy Location Cervical   Type of Cryotherapy Ice pack   Manual Therapy   Manual therapy comments pt hooklying   Soft tissue mobilization anterior/   Myofascial Release ant chest                     PT Long Term Goals - 05/09/15 0937    PT LONG TERM GOAL #1   Title Increase Rt UE strength to =/> 4+/5 throughout 06/15/15   Time 6   Period Weeks   Status On-going   PT LONG TERM GOAL #2   Title Imcrease cervical ROM/mobility by 5-10 degrees in rotation and lateral flexion 06/15/15   Time 6   Period Weeks   Status On-going   PT LONG  TERM GOAL #3   Title Improve posture and alignment with patient to demonstrate good position for head and shoulders in plum line 06/15/15   Time 6   Period Weeks   Status On-going   PT LONG TERM GOAL #4   Title Improve body mechanics with less patterns of substitution and abnormal movement patterns with elevation of Rt UE 06/15/15   Time 6   Period Weeks   Status On-going   PT LONG TERM GOAL #5   Title Improve FOTO to </= 24% limitation 06/15/15   Time 6   Period Weeks   Status On-going               Plan - 06/05/15 0925    Clinical Impression Statement Pt did well with opening front line body. He has been doing wall push ups, bicep curls . PTA suggested to work upper back muscles for now to prevent further compression in DFL.   PT Next Visit Plan Trial of estim for muscle re-ed to elbow flexors, IASTM to Rt neck/shoulder girdle. Deep neck flexor strengthening.         Problem  List Patient Active Problem List   Diagnosis Date Noted  . 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 Hyperlipidemia 04/15/2014  . History of bowel resection 04/14/2014  . Testosterone deficiency 04/14/2014  . DIARRHEA 03/14/2010  . ABDOMINAL PAIN, GENERALIZED 03/14/2010  . VARICES OF OTHER SITES 10/04/2009  . IMPAIRED FASTING GLUCOSE 07/24/2009  . PARONYCHIA, FINGER 07/13/2009  . SKIN RASH 07/13/2009  . ELEVATED PROSTATE SPECIFIC ANTIGEN 04/10/2009  . VIRAL URI 05/09/2008  . CELLULITIS AND ABSCESS OF OTHER SPECIFIED SITE 05/09/2008  . CHRONIC RHINITIS 06/04/2007  . 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 HYPOGONADISM, MALE 11/04/2006  . DISORDER, DEPRESSIVE NEC 11/04/2006  . HYPERLIPIDEMIA 10/07/2006  . ACNE ROSACEA 10/07/2006  . LIBIDO, DECREASED 10/07/2006     Natividad Brood, PTA  06/05/2015, 9:26 AM  [facility] [place], [place], [address] Phone: [phone]   Fax:  [phone]  Name: Marc Lowe MRN: [id number] Date of Birth: 1951/01/17

## 2015-06-07 ENCOUNTER — Ambulatory Visit (INDEPENDENT_AMBULATORY_CARE_PROVIDER_SITE_OTHER): Payer: BC Managed Care – PPO | Admitting: Rehabilitative and Restorative Service Providers"

## 2015-06-07 ENCOUNTER — Encounter: Payer: Self-pay | Admitting: Rehabilitative and Restorative Service Providers"

## 2015-06-07 DIAGNOSIS — M436 Torticollis: Secondary | ICD-10-CM

## 2015-06-07 DIAGNOSIS — R29898 Other symptoms and signs involving the musculoskeletal system: Secondary | ICD-10-CM

## 2015-06-07 DIAGNOSIS — Z7409 Other reduced mobility: Secondary | ICD-10-CM | POA: Diagnosis not present

## 2015-06-07 DIAGNOSIS — R531 Weakness: Secondary | ICD-10-CM

## 2015-06-07 DIAGNOSIS — R293 Abnormal posture: Secondary | ICD-10-CM

## 2015-06-07 DIAGNOSIS — R6889 Other general symptoms and signs: Secondary | ICD-10-CM

## 2015-06-07 NOTE — Therapy (Signed)
[place], [place], [address] Phone: [phone]   Fax:  [phone]  Physical Therapy Treatment  Patient Details  Name: Marc Lowe MRN: [id number] Date of Birth: 04-20-1951 Referring Provider: Dr Darene Lamer  Encounter Date: 06/07/2015      PT End of Session - 06/07/15 0853    Visit Number 11   Number of Visits 12   Date for PT Re-Evaluation 06/15/15   PT Start Time 0849   PT Stop Time 0941   PT Time Calculation (min) 52 min   Activity Tolerance Patient tolerated treatment well      Past Medical History  Diagnosis Date  . Diverticulitis     History reviewed. No pertinent past surgical history.  There were no vitals filed for this visit.  Visit Diagnosis:  Abnormal posture  Weakness of left upper extremity  Stiffness of cervical spine  Decreased strength, endurance, and mobility      Subjective Assessment - 06/07/15 0853    Subjective Doing OK - no numbness - thinks he is probably getting stronger. Using home cervical traction which helps. Progressing well toward stated goals of treatment.    Currently in Pain? No/denies                         [facility] Adult PT Treatment/Exercise - 06/07/15 0001    Elbow Exercises   Elbow Flexion Strengthening;Right;Standing   Neck Exercises: Standing   Upper Extremity D1 Extension;5 reps   Theraband Level (UE D1) Level 2 (Red)   Neck Exercises: Supine   Neck Retraction 10 reps   Other Supine Exercise chin tuck   Shoulder Exercises: Prone   Other Prone Exercises Scap squeeze withhands clasp behind back; airplane and goal post arms and axial extension   Shoulder Exercises: Standing   External Rotation Left;20 reps;Theraband   Theraband Level (Shoulder External Rotation) Level 3 (Green)   Flexion Right;Weights;20 reps   Shoulder Flexion Weight (lbs) 4#   ABduction Strengthening;20 reps;Weights   Shoulder ABduction Weight (lbs)  4#   Extension Both;Theraband;20 reps   Theraband Level (Shoulder Extension) Level 4 (Blue)   Corporate treasurer;Both   Theraband Level (Shoulder Row) Level 4 (Blue)   Other Standing Exercises elbow flexion brachialis thumb up 2 # x 10   Other Standing Exercises supination/pronation 1# x10 standing w/noodle    Shoulder Exercises: Therapy Ball   Other Therapy Ball Exercises reverse wall push ups x10    Shoulder Exercises: ROM/Strengthening   UBE (Upper Arm Bike) L7: alternating forward backward each min. total of 5 min   Shoulder Exercises: Stretch   Corner Stretch Limitations doorway 3 positions 3 reps 20-30 sec hold   Other Shoulder Stretches supine on foam roll arms out at sides 2-3 min    Shoulder Exercises: Body Blade   Flexion 60 seconds  x2   Cryotherapy   Number Minutes Cryotherapy 12 Minutes   Cryotherapy Location Cervical   Type of Cryotherapy Ice pack   Manual Therapy   Manual therapy comments pt hooklying   Soft tissue mobilization ant/lat/post cervical musculature; upper trap; leveator   Myofascial Release ant chest   Passive ROM gentle passive stretch lat flex and rotation                PT Education - 06/07/15 1225    Education provided Yes   Education Details discussed HEP will add supine snow angel prolonged stretch on swim noodle;  discussed possible d/c    Person(s) Educated Patient   Methods Explanation;Demonstration;Tactile cues;Verbal cues   Comprehension Verbalized understanding;Returned demonstration;Verbal cues required;Tactile cues required             PT Long Term Goals - 05/09/15 0937    PT LONG TERM GOAL #1   Title Increase Rt UE strength to =/> 4+/5 throughout 06/15/15   Time 6   Period Weeks   Status On-going   PT LONG TERM GOAL #2   Title Imcrease cervical ROM/mobility by 5-10 degrees in rotation and lateral flexion 06/15/15   Time 6   Period Weeks   Status On-going   PT LONG TERM GOAL #3   Title Improve posture and alignment  with patient to demonstrate good position for head and shoulders in plum line 06/15/15   Time 6   Period Weeks   Status On-going   PT LONG TERM GOAL #4   Title Improve body mechanics with less patterns of substitution and abnormal movement patterns with elevation of Rt UE 06/15/15   Time 6   Period Weeks   Status On-going   PT LONG TERM GOAL #5   Title Improve FOTO to </= 24% limitation 06/15/15   Time 6   Period Weeks   Status On-going               Plan - 06/07/15 1226    Clinical Impression Statement Continued work on posture and strengthening. Progressing well toward stated goals of therapy. Discussed trial of I HEP following next visit Pt will try HEP without OPPT and call if he feels he needs to return for further PT. He will continue with home cervical traction and postural correction.    Pt will benefit from skilled therapeutic intervention in order to improve on the following deficits Decreased strength;Decreased range of motion;Decreased mobility;Increased fascial restricitons;Decreased activity tolerance;Decreased endurance;Postural dysfunction;Improper body mechanics   Rehab Potential Good   PT Frequency 2x / week   PT Duration 6 weeks   PT Treatment/Interventions Patient/family education;ADLs/Self Care Home Management;Therapeutic exercise;Therapeutic activities;Manual techniques;Dry needling;Cryotherapy;Electrical Stimulation;Moist Heat;Ultrasound;Traction   PT Next Visit Plan Trial of estim for muscle re-ed to elbow flexors, IASTM to Rt neck/shoulder girdle. Deep neck flexor strengthening.    PT Home Exercise Plan continue HEP; consider d/c to I HEP following next visit. Will leave chart open for 2 weeks and pt will call to reschedule if he feels he needs further treatment.    Consulted and Agree with Plan of Care Patient        Problem List Patient Active Problem List   Diagnosis Date Noted  . 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 Hyperlipidemia  04/15/2014  . History of bowel resection 04/14/2014  . Testosterone deficiency 04/14/2014  . DIARRHEA 03/14/2010  . ABDOMINAL PAIN, GENERALIZED 03/14/2010  . VARICES OF OTHER SITES 10/04/2009  . IMPAIRED FASTING GLUCOSE 07/24/2009  . PARONYCHIA, FINGER 07/13/2009  . SKIN RASH 07/13/2009  . ELEVATED PROSTATE SPECIFIC ANTIGEN 04/10/2009  . VIRAL URI 05/09/2008  . CELLULITIS AND ABSCESS OF OTHER SPECIFIED SITE 05/09/2008  . CHRONIC RHINITIS 06/04/2007  . 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 HYPOGONADISM, MALE 11/04/2006  . DISORDER, DEPRESSIVE NEC 11/04/2006  . HYPERLIPIDEMIA 10/07/2006  . ACNE ROSACEA 10/07/2006  . Laveda Abbe DECREASED 10/07/2006    Cristie Mckinney Nilda Simmer PT, MPH 06/07/2015, 12:32 PM  [facility] [place], [place], [address] Phone: [phone]   Fax:  [phone]  Name: Marc Lowe  MRN: [id number] Date of Birth: [birth year]

## 2015-06-13 ENCOUNTER — Ambulatory Visit (INDEPENDENT_AMBULATORY_CARE_PROVIDER_SITE_OTHER): Payer: BC Managed Care – PPO | Admitting: Rehabilitative and Restorative Service Providers"

## 2015-06-13 ENCOUNTER — Encounter: Payer: Self-pay | Admitting: Rehabilitative and Restorative Service Providers"

## 2015-06-13 DIAGNOSIS — Z7409 Other reduced mobility: Secondary | ICD-10-CM | POA: Diagnosis not present

## 2015-06-13 DIAGNOSIS — M436 Torticollis: Secondary | ICD-10-CM

## 2015-06-13 DIAGNOSIS — R293 Abnormal posture: Secondary | ICD-10-CM

## 2015-06-13 DIAGNOSIS — R531 Weakness: Secondary | ICD-10-CM

## 2015-06-13 DIAGNOSIS — R29898 Other symptoms and signs involving the musculoskeletal system: Secondary | ICD-10-CM | POA: Diagnosis not present

## 2015-06-13 NOTE — Therapy (Signed)
[place], [place], [address] Phone: [phone]   Fax:  [phone]  Physical Therapy Treatment  Patient Details  Name: Marc Lowe MRN: [id number] Date of Birth: 03-Jun-1951 Referring Provider: Dr. Dianah Lowe   Encounter Date: 06/13/2015      PT End of Session - 06/13/15 0940    Visit Number 12   Number of Visits 12   Date for PT Re-Evaluation 06/15/15   PT Start Time 0940   PT Stop Time 1019   PT Time Calculation (min) 39 min   Activity Tolerance Patient tolerated treatment well      Past Medical History  Diagnosis Date  . Diverticulitis     History reviewed. No pertinent past surgical history.  There were no vitals filed for this visit.  Visit Diagnosis:  Abnormal posture  Weakness of left upper extremity  Stiffness of cervical spine  Decreased strength, endurance, and mobility      Subjective Assessment - 06/13/15 0940    Subjective No numbness; working on exercises and using home cervical traction without difficulty. Can tell he has improved but still has a ways to go. Comfortable with continuing I HEP and will call with any problems or questions   Currently in Pain? No/denies            [facility] PT Assessment - 06/13/15 0001    Assessment   Medical Diagnosis Cervical impingment - Rt UE weakness   Referring Provider Dr. Dianah Lowe    Onset Date/Surgical Date 11/28/14   Hand Dominance Right   Next MD Visit no return scheduled   Observation/Other Assessments   Focus on Therapeutic Outcomes (FOTO)  25% limitation    AROM   Right Shoulder Flexion 148 Degrees   Right Shoulder ABduction 159 Degrees   Right Shoulder Internal Rotation 37 Degrees   Right Shoulder External Rotation 95 Degrees   Cervical Flexion 60   Cervical Extension 48   Cervical - Right Side Bend 37   Cervical - Left Side Bend 30   Cervical - Right Rotation 70   Cervical - Left Rotation 63   Strength   Right Shoulder Flexion --  5-/5   Right Shoulder Extension 5/5   Right Shoulder ABduction 5/5   Right Shoulder Internal Rotation 5/5   Right Shoulder External Rotation 5/5   Right Shoulder Horizontal ABduction --  5-/5   Right Shoulder Horizontal ADduction 5/5   Right Elbow Flexion 4+/5   Right Elbow Extension 5/5                     [facility] Adult PT Treatment/Exercise - 06/13/15 0001    Elbow Exercises   Elbow Flexion Strengthening;Right;Standing   Neck Exercises: Standing   Other Standing Exercises scap squeeze with noodle 10 reps 10 sec hold   Neck Exercises: Supine   Neck Retraction 10 reps   Other Supine Exercise chin tuck   Shoulder Exercises: Standing   External Rotation Left;20 reps;Theraband   Theraband Level (Shoulder External Rotation) Level 3 (Green)   Flexion Right;Weights;20 reps   Shoulder Flexion Weight (lbs) 4#   ABduction Strengthening;20 reps;Weights   Shoulder ABduction Weight (lbs) 4#   Extension Both;Theraband;20 reps   Theraband Level (Shoulder Extension) Level 4 (Blue)   Corporate treasurer;Both   Theraband Level (Shoulder Row) Level 4 (Blue)   Other Standing Exercises elbow flexion brachialis thumb up 2 # x 10   Shoulder Exercises: ROM/Strengthening   UBE (Upper  Arm Bike) L7: alternating forward backward each min. total of 5 min   Shoulder Exercises: Stretch   Corner Stretch Limitations doorway 3 positions 3 reps 20-30 sec hold   Shoulder Exercises: Body Blade   Flexion 60 seconds  x2   Cryotherapy   Number Minutes Cryotherapy 12 Minutes   Cryotherapy Location Cervical   Type of Cryotherapy Ice pack   Manual Therapy   Manual therapy comments pt hooklying   Soft tissue mobilization ant/lat/post cervical musculature; upper trap; leveator   Myofascial Release ant chest   Passive ROM gentle passive stretch lat flex and rotation                PT Education - 06/13/15 0954    Education provided Yes   Education  Details importance of continuing HEP including cervical traction and UE strengthening   Person(s) Educated Patient   Methods Explanation   Comprehension Verbalized understanding             PT Long Term Goals - 06/13/15 1000    PT LONG TERM GOAL #1   Title Increase Rt UE strength to =/> 4+/5 throughout 06/15/15   Time 6   Period Weeks   Status Achieved   PT LONG TERM GOAL #2   Title Imcrease cervical ROM/mobility by 5-10 degrees in rotation and lateral flexion 06/15/15   Time 6   Period Weeks   Status Achieved   PT LONG TERM GOAL #3   Title Improve posture and alignment with patient to demonstrate good position for head and shoulders in plum line 06/15/15   Time 6   Period Weeks   Status Achieved   PT LONG TERM GOAL #4   Title Improve body mechanics with less patterns of substitution and abnormal movement patterns with elevation of Rt UE 06/15/15   Time 6   Period Weeks   Status Achieved   PT LONG TERM GOAL #5   Title Improve FOTO to </= 24% limitation 06/15/15   Baseline FOTO 25% limitation predicted 24%    Time 6   Period Weeks   Status Partially Met               Plan - 06/13/15 0955    Clinical Impression Statement Patient has progressed well with therapy. He has improved porture and alignment; increased strength Rt UE and improved functional activities. Goals of therapy hae been accomplished and Marc Lowe will be discharged to I HEP.    Pt will benefit from skilled therapeutic intervention in order to improve on the following deficits Decreased strength;Decreased range of motion;Decreased mobility;Increased fascial restricitons;Decreased activity tolerance;Decreased endurance;Postural dysfunction;Improper body mechanics   Rehab Potential Good   PT Frequency 2x / week   PT Duration 6 weeks   PT Treatment/Interventions Patient/family education;ADLs/Self Care Home Management;Therapeutic exercise;Therapeutic activities;Manual techniques;Dry  needling;Cryotherapy;Electrical Stimulation;Moist Heat;Ultrasound;Traction   PT Next Visit Plan D/c to I HEP    PT Home Exercise Plan continue HEP;  d/c to I HEP following next visit. Will leave chart open for 2 weeks and pt will call to reschedule if he feels he needs further treatment.    Consulted and Agree with Plan of Care Patient        Problem List Patient Active Problem List   Diagnosis Date Noted  . 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 Hyperlipidemia 04/15/2014  . History of bowel resection 04/14/2014  . Testosterone deficiency 04/14/2014  . DIARRHEA 03/14/2010  . ABDOMINAL PAIN, GENERALIZED 03/14/2010  . VARICES OF  OTHER SITES 10/04/2009  . IMPAIRED FASTING GLUCOSE 07/24/2009  . PARONYCHIA, FINGER 07/13/2009  . SKIN RASH 07/13/2009  . ELEVATED PROSTATE SPECIFIC ANTIGEN 04/10/2009  . VIRAL URI 05/09/2008  . CELLULITIS AND ABSCESS OF OTHER SPECIFIED SITE 05/09/2008  . CHRONIC RHINITIS 06/04/2007  . 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 HYPOGONADISM, MALE 11/04/2006  . DISORDER, DEPRESSIVE NEC 11/04/2006  . HYPERLIPIDEMIA 10/07/2006  . ACNE ROSACEA 10/07/2006  . LIBIDO, DECREASED 10/07/2006    Carletha Dawn Nilda Simmer PT, MPH 06/13/2015, 12:21 PM  [facility] [place], [place], [address] Phone: [phone]   Fax:  [phone]  Name: EMMANUELLE COXE MRN: [id number] Date of Birth: 11/17/1950   PHYSICAL THERAPY DISCHARGE SUMMARY  Visits from Start of Care: 12  Current functional level related to goals / functional outcomes: Goals accomplished; pt progressing well with strengthening and has returned to work able to preform most tasks with Rt UE; I in HEP    Remaining deficits: Weakness Rt biceps/deltoid 4+/5 to 5-/5    Education / Equipment: HEP theraband in various colors   Plan: Patient agrees to discharge.  Patient goals were met. Patient is being discharged due to meeting the stated  rehab goals.  ?????   Maxime Beckner P. Helene Kelp PT, MPH 06/13/2015 12:23 PM

## 2016-05-08 ENCOUNTER — Ambulatory Visit (INDEPENDENT_AMBULATORY_CARE_PROVIDER_SITE_OTHER): Payer: BC Managed Care – PPO | Admitting: Physician Assistant

## 2016-05-08 ENCOUNTER — Encounter: Payer: Self-pay | Admitting: Physician Assistant

## 2016-05-08 VITALS — BP 102/71 | HR 86 | Ht 74.0 in | Wt 232.0 lb

## 2016-05-08 DIAGNOSIS — R1032 Left lower quadrant pain: Secondary | ICD-10-CM | POA: Diagnosis not present

## 2016-05-08 DIAGNOSIS — Z9049 Acquired absence of other specified parts of digestive tract: Secondary | ICD-10-CM

## 2016-05-08 DIAGNOSIS — Z8719 Personal history of other diseases of the digestive system: Secondary | ICD-10-CM

## 2016-05-08 DIAGNOSIS — Z9289 Personal history of other medical treatment: Secondary | ICD-10-CM | POA: Diagnosis not present

## 2016-05-08 LAB — CBC WITH DIFFERENTIAL/PLATELET
BASOS ABS: 0 {cells}/uL (ref 0–200)
Basophils Relative: 0 %
Eosinophils Absolute: 138 cells/uL (ref 15–500)
Eosinophils Relative: 3 %
HEMATOCRIT: 47 % (ref 38.5–50.0)
HEMOGLOBIN: 16.4 g/dL (ref 13.2–17.1)
LYMPHS ABS: 1426 {cells}/uL (ref 850–3900)
Lymphocytes Relative: 31 %
MCH: 30.2 pg (ref 27.0–33.0)
MCHC: 34.9 g/dL (ref 32.0–36.0)
MCV: 86.6 fL (ref 80.0–100.0)
MONO ABS: 552 {cells}/uL (ref 200–950)
MPV: 9.3 fL (ref 7.5–12.5)
Monocytes Relative: 12 %
NEUTROS PCT: 54 %
Neutro Abs: 2484 cells/uL (ref 1500–7800)
Platelets: 214 10*3/uL (ref 140–400)
RBC: 5.43 MIL/uL (ref 4.20–5.80)
RDW: 13.6 % (ref 11.0–15.0)
WBC: 4.6 10*3/uL (ref 3.8–10.8)

## 2016-05-08 LAB — COMPLETE METABOLIC PANEL WITH GFR
ALBUMIN: 4.1 g/dL (ref 3.6–5.1)
ALK PHOS: 55 U/L (ref 40–115)
ALT: 20 U/L (ref 9–46)
AST: 20 U/L (ref 10–35)
BUN: 17 mg/dL (ref 7–25)
CALCIUM: 9.4 mg/dL (ref 8.6–10.3)
CO2: 29 mmol/L (ref 20–31)
Chloride: 104 mmol/L (ref 98–110)
Creat: 1.01 mg/dL (ref 0.70–1.25)
GFR, EST NON AFRICAN AMERICAN: 79 mL/min (ref >=60)
GFR, Est African American: >89 mL/min (ref >=60)
Glucose, Bld: 88 mg/dL (ref 65–99)
POTASSIUM: 4.4 mmol/L (ref 3.5–5.3)
Sodium: 141 mmol/L (ref 135–146)
Total Bilirubin: 0.7 mg/dL (ref 0.2–1.2)
Total Protein: 6.5 g/dL (ref 6.1–8.1)

## 2016-05-08 NOTE — Progress Notes (Signed)
Subjective:    Patient ID: Marc Lowe, male    DOB: 1950-11-18, 65 y.o.   MRN: [id number]  HPI  Pt is a 65 yo male who presents to the clinic with LLQ pain. He was previously Dr. Theron Arista pt and re-establishing with myself.   .. Active Ambulatory Problems    Diagnosis Date Noted  . 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 HYPOGONADISM, MALE 11/04/2006  . HYPERLIPIDEMIA 10/07/2006  . DISORDER, DEPRESSIVE NEC 11/04/2006  . VARICES OF OTHER SITES 10/04/2009  . VIRAL URI 05/09/2008  . CHRONIC RHINITIS 06/04/2007  . PARONYCHIA, FINGER 07/13/2009  . CELLULITIS AND ABSCESS OF OTHER SPECIFIED SITE 05/09/2008  . ACNE ROSACEA 10/07/2006  . SKIN RASH 07/13/2009  . DIARRHEA 03/14/2010  . ABDOMINAL PAIN, GENERALIZED 03/14/2010  . IMPAIRED FASTING GLUCOSE 07/24/2009  . ELEVATED PROSTATE SPECIFIC ANTIGEN 04/10/2009  . LIBIDO, DECREASED 10/07/2006  . History of bowel resection 04/14/2014  . Testosterone deficiency 04/14/2014  . Hyperlipidemia 04/15/2014  . Cervical nerve root impingement 04/11/2015   Resolved Ambulatory Problems    Diagnosis Date Noted  . No Resolved Ambulatory Problems   Past Medical History:  Diagnosis Date  . Diverticulitis    .[name] Kitchen Social History   Social History  . Marital status: Married    Spouse name: N/A  . Number of children: N/A  . Years of education: N/A   Occupational History  . Not on file.   Social History Main Topics  . Smoking status: Never Smoker  . Smokeless tobacco: Not on file  . Alcohol use Not on file  . Drug use: No  . Sexual activity: Yes    Partners: Female   Other Topics Concern  . Not on file   Social History Narrative  . No narrative on file   Pt is concerned because he has some episodic LLQ pain and pressure that he has not had since his diverticulitis and bowel resection in December 2005. He is concerned because he has some trips coming up and wants to make sure nothing is going on. He denies any fever, chills, body aches.  No stool changes. He is having daily normal consistence bowel movements.no melena or hematochezia. LLQ occur yesterday and was relieved after drinking water.     Review of Systems  All other systems reviewed and are negative.      Objective:   Physical Exam  Constitutional: He is oriented to person, place, and time. He appears well-developed and well-nourished.  HENT:  Head: Normocephalic and atraumatic.  Cardiovascular: Normal rate, regular rhythm and normal heart sounds.   Pulmonary/Chest: Effort normal and breath sounds normal.  Abdominal: Soft. Bowel sounds are normal. He exhibits no distension and no mass. There is tenderness. There is no rebound and no guarding.  No guarding or rebound.  Mild tenderness in left lower quadrant.   Neurological: He is alert and oriented to person, place, and time.  Psychiatric: He has a normal mood and affect. His behavior is normal.          Assessment & Plan:  [name] KitchenMarland KitchenDiagnoses and all orders for this visit:  LLQ pain -     CBC with Differential/Platelet -     COMPLETE METABOLIC PANEL WITH GFR  History of bowel resection -     CBC with Differential/Platelet -     COMPLETE METABOLIC PANEL WITH GFR  History of diverticulitis -     CBC with Differential/Platelet -     COMPLETE METABOLIC PANEL WITH GFR  Unclear etiology. Discussed likely stool moving through colon but cannot rule out adhesion from surgery. Will get labs and attempt to get CT of abdomen and pelvis to rule out any pathological or surgical. Up to date normal colonoscopy.

## 2016-05-10 ENCOUNTER — Ambulatory Visit (INDEPENDENT_AMBULATORY_CARE_PROVIDER_SITE_OTHER): Payer: BC Managed Care – PPO

## 2016-05-10 DIAGNOSIS — Z980 Intestinal bypass and anastomosis status: Secondary | ICD-10-CM

## 2016-05-10 DIAGNOSIS — K769 Liver disease, unspecified: Secondary | ICD-10-CM | POA: Diagnosis not present

## 2016-05-10 DIAGNOSIS — K432 Incisional hernia without obstruction or gangrene: Secondary | ICD-10-CM

## 2016-05-10 MED ORDER — IOPAMIDOL (ISOVUE-300) INJECTION 61%
100.0000 mL | Freq: Once | INTRAVENOUS | Status: AC | PRN
  Administered 2016-05-10: 100 mL via INTRAVENOUS

## 2016-06-11 ENCOUNTER — Ambulatory Visit (INDEPENDENT_AMBULATORY_CARE_PROVIDER_SITE_OTHER): Payer: BC Managed Care – PPO | Admitting: Physician Assistant

## 2016-06-11 ENCOUNTER — Encounter: Payer: Self-pay | Admitting: Physician Assistant

## 2016-06-11 VITALS — BP 120/90 | HR 72 | Ht 74.0 in | Wt 230.0 lb

## 2016-06-11 DIAGNOSIS — E785 Hyperlipidemia, unspecified: Secondary | ICD-10-CM | POA: Diagnosis not present

## 2016-06-11 DIAGNOSIS — R9431 Abnormal electrocardiogram [ECG] [EKG]: Secondary | ICD-10-CM

## 2016-06-11 DIAGNOSIS — Z131 Encounter for screening for diabetes mellitus: Secondary | ICD-10-CM | POA: Diagnosis not present

## 2016-06-11 DIAGNOSIS — G454 Transient global amnesia: Secondary | ICD-10-CM | POA: Diagnosis not present

## 2016-06-11 DIAGNOSIS — Z8249 Family history of ischemic heart disease and other diseases of the circulatory system: Secondary | ICD-10-CM

## 2016-06-11 MED ORDER — ASPIRIN EC 81 MG PO TBEC: 81.0000 mg | DELAYED_RELEASE_TABLET | Freq: Every day | ORAL | 3 refills | Status: DC

## 2016-06-11 NOTE — Progress Notes (Addendum)
Subjective:    Patient ID: Marc Lowe, male    DOB: 1951-01-19, 65 y.o.   MRN: [id number]  HPI  Pt is a 65 yo male who presents to the clinic with his wife. On 06/08/16 pt went down to take a hot shower in his outdoor shower and lost all memory until he woke up in the hospital. Complete work up was done in hospital at [facility] in [place] for stroke/seizure. He was dx with transient global amnesia. He "awoke" after 2 hours with full memory of everything that happened before the shower. He was discharged on baby asa 81mg .   Pt's wife is concerned because while he was at hospital and hooked up to ECG showed some arrhymias. Patient's father had atrial fibrillation and would like for him to be evaluated. He denies any CP, palpitations, dizziness, SOB.   [name] Kitchen. Active Ambulatory Problems    Diagnosis Date Noted  . 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 HYPOGONADISM, MALE 11/04/2006  . HYPERLIPIDEMIA 10/07/2006  . DISORDER, DEPRESSIVE NEC 11/04/2006  . VARICES OF OTHER SITES 10/04/2009  . VIRAL URI 05/09/2008  . CHRONIC RHINITIS 06/04/2007  . PARONYCHIA, FINGER 07/13/2009  . CELLULITIS AND ABSCESS OF OTHER SPECIFIED SITE 05/09/2008  . ACNE ROSACEA 10/07/2006  . SKIN RASH 07/13/2009  . DIARRHEA 03/14/2010  . ABDOMINAL PAIN, GENERALIZED 03/14/2010  . IMPAIRED FASTING GLUCOSE 07/24/2009  . ELEVATED PROSTATE SPECIFIC ANTIGEN 04/10/2009  . LIBIDO, DECREASED 10/07/2006  . History of bowel resection 04/14/2014  . Testosterone deficiency 04/14/2014  . Hyperlipidemia 04/15/2014  . 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 LLQ pain 05/08/2016  . History of diverticulitis 05/08/2016  . 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   Resolved Ambulatory Problems    Diagnosis Date Noted  . No Resolved Ambulatory Problems   Past Medical History:  Diagnosis Date  . Diverticulitis    .[name] Kitchen Social History   Social History  . Marital status: Married    Spouse name: N/A  . Number of  children: N/A  . Years of education: N/A   Occupational History  . Not on file.   Social History Main Topics  . Smoking status: Never Smoker  . Smokeless tobacco: Not on file  . Alcohol use Not on file  . Drug use: No  . Sexual activity: Yes    Partners: Female   Other Topics Concern  . Not on file   Social History Narrative  . No narrative on file        Review of Systems  All other systems reviewed and are negative.      Objective:   Physical Exam  Constitutional: He is oriented to person, place, and time. He appears well-developed and well-nourished.  HENT:  Head: Normocephalic and atraumatic.  Eyes: Conjunctivae and EOM are normal. Pupils are equal, round, and reactive to light.  Cardiovascular: Normal rate, regular rhythm and normal heart sounds.   Pulmonary/Chest: Effort normal and breath sounds normal.  Musculoskeletal:  Upper and lower extremity strength 5/5.    Neurological: He is alert and oriented to person, place, and time. He has normal reflexes. He displays normal reflexes. No cranial nerve deficit. He exhibits normal muscle tone. Coordination normal.  Gait normal.  Intact rapid alternating movements.   Psychiatric: He has a normal mood and affect. His behavior is normal.          Assessment & Plan:  [name] KitchenMarland KitchenDiagnoses and all orders for this visit:  Transient global amnesia -  aspirin EC 81 MG tablet; Take 1 tablet (81 mg total) by mouth daily. -     Ambulatory referral to Neurology  Hyperlipidemia, unspecified hyperlipidemia type -     Lipid panel  Screening for diabetes mellitus -     COMPLETE METABOLIC PANEL WITH GFR  Family history of atrial fibrillation -     Holter monitor - 24 hour; Future -     Holter monitor - 24 hour  Nonspecific abnormal electrocardiogram (ECG) (EKG) -     Holter monitor - 24 hour; Future -     Holter monitor - 24 hour    Reassured patient about transient global amnesia and that it is usually a one time  occurrence. Will still refer to neurology for conversation and further reassurance.  Continue on ASA daily.  Cholesterol needs to be checked been over a year.  Rechecked BP and diastolic borderline. No hx of hypertension.

## 2016-06-11 NOTE — Addendum Note (Signed)
Addended by: Donella Stade on: 06/11/2016 12:11 PM   Modules accepted: Orders

## 2016-06-12 LAB — COMPLETE METABOLIC PANEL WITH GFR
ALBUMIN: 4.1 g/dL (ref 3.6–5.1)
ALK PHOS: 53 U/L (ref 40–115)
ALT: 20 U/L (ref 9–46)
AST: 17 U/L (ref 10–35)
BILIRUBIN TOTAL: 0.6 mg/dL (ref 0.2–1.2)
BUN: 19 mg/dL (ref 7–25)
CALCIUM: 9.5 mg/dL (ref 8.6–10.3)
CO2: 27 mmol/L (ref 20–31)
CREATININE: 1.02 mg/dL (ref 0.70–1.25)
Chloride: 103 mmol/L (ref 98–110)
GFR, Est Non African American: 78 mL/min (ref >=60)
Glucose, Bld: 99 mg/dL (ref 65–99)
Potassium: 4.5 mmol/L (ref 3.5–5.3)
Sodium: 139 mmol/L (ref 135–146)
TOTAL PROTEIN: 6.6 g/dL (ref 6.1–8.1)

## 2016-06-12 LAB — LIPID PANEL
CHOLESTEROL: 224 mg/dL — AB (ref <200)
HDL: 43 mg/dL (ref >40)
LDL Cholesterol: 150 mg/dL — ABNORMAL HIGH (ref <100)
TRIGLYCERIDES: 154 mg/dL — AB (ref <150)
Total CHOL/HDL Ratio: 5.2 Ratio — ABNORMAL HIGH (ref <5.0)
VLDL: 31 mg/dL — AB (ref <30)

## 2016-11-21 ENCOUNTER — Ambulatory Visit (INDEPENDENT_AMBULATORY_CARE_PROVIDER_SITE_OTHER): Payer: BC Managed Care – PPO | Admitting: Family Medicine

## 2016-11-21 ENCOUNTER — Encounter: Payer: Self-pay | Admitting: Family Medicine

## 2016-11-21 ENCOUNTER — Ambulatory Visit (INDEPENDENT_AMBULATORY_CARE_PROVIDER_SITE_OTHER): Payer: BC Managed Care – PPO

## 2016-11-21 DIAGNOSIS — M25612 Stiffness of left shoulder, not elsewhere classified: Secondary | ICD-10-CM

## 2016-11-21 DIAGNOSIS — M25512 Pain in left shoulder: Secondary | ICD-10-CM | POA: Diagnosis not present

## 2016-11-21 NOTE — Patient Instructions (Signed)
Thank you for coming in today.  Work on shoulder exercises.   External rotation Internal rotation Arm straight and back  Arm bent shoulder blade back  Recheck in 1 month or sooner if needed.   If not better let me know and we do PT.   Try to do exercises 2x daily.   Resume chipping and putting or like 7 or 9 iron light swings in a few weeks when less pain.

## 2016-11-21 NOTE — Progress Notes (Signed)
   Subjective:    I'm seeing this patient as a consultation for:  Donella Stade, PA-C   CC: left shoulder pain  HPI: Patient has had ongoing left shoulder pain now for about 2 months. The pain worsened after he started doing weightlifting program and try to change his golf swing. 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 The pain is located at the lateral upper arm and the posterior lateral shoulder and is worse with external rotation. He notes some mild pain with overhead motion and denies significant pain at night. He denies radiating pain weakness or numbness fevers or chills. He has tried some over-the-counter medications and some exercises which have helped a bit. Overall the pain is improving over all is still interfering with his ability to play golf.  Past medical history, Surgical history, Family history not pertinant except as noted below, Social history, Allergies, and medications have been entered into the medical record, reviewed, and no changes needed.   Review of Systems: No headache, visual changes, nausea, vomiting, diarrhea, constipation, dizziness, abdominal pain, skin rash, fevers, chills, night sweats, weight loss, swollen lymph nodes, body aches, joint swelling, muscle aches, chest pain, shortness of breath, mood changes, visual or auditory hallucinations.   Objective:    Vitals:   11/21/16 1031  BP: 110/86  Pulse: 67   General: Well Developed, well nourished, and in no acute distress.  Neuro/Psych: Alert and oriented x3, extra-ocular muscles intact, able to move all 4 extremities, sensation grossly intact. Skin: Warm and dry, no rashes noted.  Respiratory: Not using accessory muscles, speaking in full sentences, trachea midline.  Cardiovascular: Pulses palpable, no extremity edema. Abdomen: Does not appear distended. MSK: Left shoulder normal-appearing nontender. Range of motion  normal abduction and  internal rotation to the scapula External  rotation 30 beyond neutral position Positive Hawkins and Neer status. Negative empty can test. Negative Yergason's and speeds test. Negative crossover arm compression test. Strength normal abduction and internal rotation slightly diminished external rotation strength 4/5 with pain.   Contralateral right shoulder: Normal-appearing nontender Range of motion Normal abduction Internal rotation to the lumbar spine  external rotation 90 beyond neutral position. Normal strength negative impingement testing  C-spine nontender normal neck motion  X-ray left shoulder pending    Impression and Recommendations:    Assessment and Plan: 66 y.o. male with  Left shoulder pain at the posterior shoulder and upper arm. This is likely strain of the infraspinatus muscle teres minor teres major latissimus. This worsened with change in his golf swing and weightlifting. He slightly weak with external rotation. Plan for scapular stabilization exercises as well as external rotation eccentric exercises with the elastic band.  Additionally he lacks external rotation. The contralateral right chin. However the total range of motion arc is very similar. I think he has extreme increased is external rotation because he played baseball and lost internal rotation. I think external rotation limitation on his left arm is likely normal for him.  Recheck in about a month.   Discussed warning signs or symptoms. Please see discharge instructions. Patient expresses understanding.

## 2016-12-05 ENCOUNTER — Telehealth: Payer: Self-pay | Admitting: Physician Assistant

## 2016-12-05 NOTE — Telephone Encounter (Signed)
Patient called and stated he was trying to get a referral to see a therapist. He did not state the reason just asked for a call back.  Please call him to place referral - CF

## 2016-12-06 NOTE — Telephone Encounter (Signed)
I am ok with referral to counseling. We have no diagnoses of this if he could give [place] stress, anxiety, depression, family issues as a dx code we can put it in as that.

## 2016-12-12 NOTE — Telephone Encounter (Signed)
Pt is actually going to come in for CPE first.

## 2016-12-16 ENCOUNTER — Ambulatory Visit (INDEPENDENT_AMBULATORY_CARE_PROVIDER_SITE_OTHER): Payer: BC Managed Care – PPO | Admitting: Physician Assistant

## 2016-12-16 ENCOUNTER — Encounter: Payer: Self-pay | Admitting: Physician Assistant

## 2016-12-16 VITALS — BP 135/71 | HR 69 | Ht 74.0 in | Wt 226.0 lb

## 2016-12-16 DIAGNOSIS — Z131 Encounter for screening for diabetes mellitus: Secondary | ICD-10-CM | POA: Diagnosis not present

## 2016-12-16 DIAGNOSIS — Z1389 Encounter for screening for other disorder: Secondary | ICD-10-CM

## 2016-12-16 DIAGNOSIS — Z1159 Encounter for screening for other viral diseases: Secondary | ICD-10-CM | POA: Diagnosis not present

## 2016-12-16 DIAGNOSIS — R5383 Other fatigue: Secondary | ICD-10-CM | POA: Diagnosis not present

## 2016-12-16 DIAGNOSIS — F339 Major depressive disorder, recurrent, unspecified: Secondary | ICD-10-CM | POA: Diagnosis not present

## 2016-12-16 DIAGNOSIS — Z1331 Encounter for screening for depression: Secondary | ICD-10-CM

## 2016-12-16 DIAGNOSIS — Z13220 Encounter for screening for lipoid disorders: Secondary | ICD-10-CM | POA: Diagnosis not present

## 2016-12-16 DIAGNOSIS — Z0000 Encounter for general adult medical examination without abnormal findings: Secondary | ICD-10-CM | POA: Diagnosis not present

## 2016-12-16 DIAGNOSIS — B351 Tinea unguium: Secondary | ICD-10-CM | POA: Diagnosis not present

## 2016-12-16 LAB — CBC
HCT: 44.6 % (ref 38.5–50.0)
HEMOGLOBIN: 14.9 g/dL (ref 13.2–17.1)
MCH: 29.1 pg (ref 27.0–33.0)
MCHC: 33.4 g/dL (ref 32.0–36.0)
MCV: 87.1 fL (ref 80.0–100.0)
MPV: 9.7 fL (ref 7.5–12.5)
PLATELETS: 205 10*3/uL (ref 140–400)
RBC: 5.12 MIL/uL (ref 4.20–5.80)
RDW: 13.9 % (ref 11.0–15.0)
WBC: 4.3 10*3/uL (ref 3.8–10.8)

## 2016-12-16 LAB — TSH: TSH: 4.52 m[IU]/L — AB (ref 0.40–4.50)

## 2016-12-16 MED ORDER — VILAZODONE HCL 20 MG PO TABS: 1.0000 | ORAL_TABLET | Freq: Every day | ORAL | 1 refills | Status: DC

## 2016-12-16 MED ORDER — CICLOPIROX 8 % EX SOLN: Freq: Every day | CUTANEOUS | 0 refills | Status: DC

## 2016-12-16 NOTE — Patient Instructions (Signed)

## 2016-12-16 NOTE — Progress Notes (Signed)
Subjective:    Patient ID: Marc Lowe, male    DOB: [birth year], 66 y.o.   MRN: [id number]  HPI  Pt is a 66 yo male who presents to the clinic for CPE. He admits his depression has seemed to re surface.a few years ago he was on effexor and did really good. He stopped it and did really good for a few years. About 2-3 months ago he injured his shoulder and had to stop golfing. He has noticed a decrease in energy, no motivation, he is not hanging out with friends since. He is not on any medication. Denies any suicidal or homicidal thoughts. He hates his job. It is everything he can do to get out of bed in the morning and keep going to work.  .. Active Ambulatory Problems    Diagnosis Date Noted  . 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 HYPOGONADISM, MALE 11/04/2006  . HYPERLIPIDEMIA 10/07/2006  . Depression, recurrent ([place]) 11/04/2006  . VARICES OF OTHER SITES 10/04/2009  . CHRONIC RHINITIS 06/04/2007  . PARONYCHIA, FINGER 07/13/2009  . CELLULITIS AND ABSCESS OF OTHER SPECIFIED SITE 05/09/2008  . ACNE ROSACEA 10/07/2006  . SKIN RASH 07/13/2009  . DIARRHEA 03/14/2010  . ABDOMINAL PAIN, GENERALIZED 03/14/2010  . IMPAIRED FASTING GLUCOSE 07/24/2009  . ELEVATED PROSTATE SPECIFIC ANTIGEN 04/10/2009  . LIBIDO, DECREASED 10/07/2006  . History of bowel resection 04/14/2014  . Testosterone deficiency 04/14/2014  . Hyperlipidemia 04/15/2014  . 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 LLQ pain 05/08/2016  . History of diverticulitis 05/08/2016  . 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 Family history of atrial fibrillation 06/11/2016  . Left shoulder pain 11/21/2016  . Toenail fungus 12/16/2016  . No energy 12/17/2016   Resolved Ambulatory Problems    Diagnosis Date Noted  . VIRAL URI 05/09/2008   Past Medical History:  Diagnosis Date  . Diverticulitis    .[name] Kitchen Family History  Problem Relation Age of Onset  . Atrial fibrillation Father      Review of Systems  All other  systems reviewed and are negative.      Objective:   Physical Exam BP 135/71   Pulse 69   Ht 6\' 2"  (1.88 m)   Wt 226 lb (102.5 kg)   BMI 29.02 kg/m   General Appearance:    Alert, cooperative, no distress, appears stated age  Head:    Normocephalic, without obvious abnormality, atraumatic  Eyes:    PERRL, conjunctiva/corneas clear, EOM's intact, fundi    benign, both eyes       Ears:    Normal TM's and external ear canals, both ears  Nose:   Nares normal, septum midline, mucosa normal, no drainage    or sinus tenderness  Throat:   Lips, mucosa, and tongue normal; teeth and gums normal  Neck:   Supple, symmetrical, trachea midline, no adenopathy;       thyroid:  No enlargement/tenderness/nodules; no carotid   bruit or JVD  Back:     Symmetric, no curvature, ROM normal, no CVA tenderness  Lungs:     Clear to auscultation bilaterally, respirations unlabored  Chest wall:    No tenderness or deformity  Heart:    Regular rate and rhythm, S1 and S2 normal, no murmur, rub   or gallop  Abdomen:     Soft, non-tender, bowel sounds active all four quadrants,    no masses, no organomegaly        Extremities:   Extremities normal, atraumatic, no cyanosis or  edema. Bilateral great toenail thick/yellow dystrophic nails.   Pulses:   2+ and symmetric all extremities  Skin:   Skin color, texture, turgor normal, no rashes or lesions  Lymph nodes:   Cervical, supraclavicular, and axillary nodes normal  Neurologic:   CNII-XII intact. Normal strength, sensation and reflexes      throughout         Assessment & Plan:  [name] KitchenMarland KitchenDemaryius was seen today for annual exam.  Diagnoses and all orders for this visit:  Routine physical examination -     Lipid panel -     COMPLETE METABOLIC PANEL WITH GFR -     TSH -     VITAMIN D 25 Hydroxy (Vit-D Deficiency, Fractures) -     B12 -     PSA -     CBC -     Testosterone  Depression, recurrent (HCC) -     Vilazodone HCl 20 MG TABS; Take 1 tablet (20 mg  total) by mouth daily. -     TSH -     Ambulatory referral to Psychology  Screening for depression  Screening for lipid disorders -     Lipid panel  Screening for diabetes mellitus -     COMPLETE METABOLIC PANEL WITH GFR  No energy -     Testosterone  Need for hepatitis C screening test -     Hepatitis C antibody  Toenail fungus -     ciclopirox (PENLAC) 8 % solution; Apply topically at bedtime. Apply over nail and surrounding skin. Apply daily over previous coat. After seven (7) days, may remove with alcohol and continue cycle.   .. Depression screen [facility] 2/9 12/16/2016  Decreased Interest 3  Down, Depressed, Hopeless 3  PHQ - 2 Score 6  Altered sleeping 0  Tired, decreased energy 3  Change in appetite 0  Feeling bad or failure about yourself  3  Trouble concentrating 0  Moving slowly or fidgety/restless 0  Suicidal thoughts 0  PHQ-9 Score 12   AUA scored 12 but claimed to not have any difficultly with symptoms. Dicussed medication and declined. Ordered PSA and CBC. Declined DRE today.   Pt had some ED with effexor will try viibryd. Discussed other side effects. Referred to counseling. Follow up in 6 weeks.   Will get labs to look for any metabolic reason for energy decline.   Discussed toenail fungus. penlac given.

## 2016-12-17 DIAGNOSIS — R5383 Other fatigue: Secondary | ICD-10-CM | POA: Insufficient documentation

## 2016-12-17 LAB — HEPATITIS C ANTIBODY: HCV Ab: NEGATIVE

## 2016-12-17 LAB — COMPLETE METABOLIC PANEL WITH GFR
ALT: 18 U/L (ref 9–46)
AST: 16 U/L (ref 10–35)
Albumin: 3.9 g/dL (ref 3.6–5.1)
Alkaline Phosphatase: 60 U/L (ref 40–115)
BUN: 11 mg/dL (ref 7–25)
CHLORIDE: 105 mmol/L (ref 98–110)
CO2: 23 mmol/L (ref 20–31)
Calcium: 8.9 mg/dL (ref 8.6–10.3)
Creat: 1.03 mg/dL (ref 0.70–1.25)
GFR, EST NON AFRICAN AMERICAN: 76 mL/min (ref >=60)
GFR, Est African American: 88 mL/min (ref >=60)
GLUCOSE: 94 mg/dL (ref 65–99)
POTASSIUM: 4.4 mmol/L (ref 3.5–5.3)
SODIUM: 140 mmol/L (ref 135–146)
Total Bilirubin: 0.5 mg/dL (ref 0.2–1.2)
Total Protein: 6.3 g/dL (ref 6.1–8.1)

## 2016-12-17 LAB — LIPID PANEL
Cholesterol: 212 mg/dL — ABNORMAL HIGH (ref <200)
HDL: 44 mg/dL (ref >40)
LDL Cholesterol: 138 mg/dL — ABNORMAL HIGH (ref <100)
Total CHOL/HDL Ratio: 4.8 Ratio (ref <5.0)
Triglycerides: 152 mg/dL — ABNORMAL HIGH (ref <150)
VLDL: 30 mg/dL (ref <30)

## 2016-12-17 LAB — PSA: PSA: 4.1 ng/mL — AB (ref <=4.0)

## 2016-12-17 LAB — TESTOSTERONE: Testosterone: 507 ng/dL (ref 250–827)

## 2016-12-17 LAB — VITAMIN B12: VITAMIN B 12: 388 pg/mL (ref 200–1100)

## 2016-12-17 LAB — VITAMIN D 25 HYDROXY (VIT D DEFICIENCY, FRACTURES): Vit D, 25-Hydroxy: 30 ng/mL (ref 30–100)

## 2016-12-18 ENCOUNTER — Telehealth: Payer: Self-pay | Admitting: *Deleted

## 2016-12-18 ENCOUNTER — Encounter: Payer: Self-pay | Admitting: Physician Assistant

## 2016-12-18 DIAGNOSIS — R7989 Other specified abnormal findings of blood chemistry: Secondary | ICD-10-CM | POA: Insufficient documentation

## 2016-12-18 NOTE — Telephone Encounter (Signed)
Pre Authorization sent to cover my meds.[id number]

## 2016-12-20 NOTE — Telephone Encounter (Signed)
Ciclopirox has been denied due to patient not having tried and failed one oral medication for toenail fungus

## 2016-12-20 NOTE — Telephone Encounter (Signed)
Call pt: if he would like to try oral his liver enzymes look great can do lamasil 250mg  daily for 12 weeks. NRF.

## 2016-12-24 MED ORDER — TERBINAFINE HCL 250 MG PO TABS: 250.0000 mg | ORAL_TABLET | Freq: Every day | ORAL | 0 refills | Status: DC

## 2016-12-24 NOTE — Telephone Encounter (Signed)
Patient notified

## 2017-02-14 ENCOUNTER — Other Ambulatory Visit: Payer: Self-pay | Admitting: Physician Assistant

## 2017-02-14 DIAGNOSIS — F339 Major depressive disorder, recurrent, unspecified: Secondary | ICD-10-CM

## 2017-03-17 ENCOUNTER — Other Ambulatory Visit: Payer: Self-pay | Admitting: Physician Assistant

## 2017-03-17 DIAGNOSIS — F339 Major depressive disorder, recurrent, unspecified: Secondary | ICD-10-CM

## 2017-04-22 ENCOUNTER — Other Ambulatory Visit: Payer: Self-pay | Admitting: Physician Assistant

## 2017-04-22 DIAGNOSIS — F339 Major depressive disorder, recurrent, unspecified: Secondary | ICD-10-CM

## 2017-04-27 ENCOUNTER — Other Ambulatory Visit: Payer: Self-pay | Admitting: Physician Assistant

## 2017-04-27 DIAGNOSIS — F339 Major depressive disorder, recurrent, unspecified: Secondary | ICD-10-CM

## 2017-05-13 ENCOUNTER — Ambulatory Visit (INDEPENDENT_AMBULATORY_CARE_PROVIDER_SITE_OTHER): Payer: BC Managed Care – PPO | Admitting: Physician Assistant

## 2017-05-13 ENCOUNTER — Encounter: Payer: Self-pay | Admitting: Physician Assistant

## 2017-05-13 VITALS — BP 132/86 | HR 59 | Ht 74.02 in | Wt 237.0 lb

## 2017-05-13 DIAGNOSIS — R4584 Anhedonia: Secondary | ICD-10-CM

## 2017-05-13 DIAGNOSIS — R0989 Other specified symptoms and signs involving the circulatory and respiratory systems: Secondary | ICD-10-CM

## 2017-05-13 DIAGNOSIS — R635 Abnormal weight gain: Secondary | ICD-10-CM | POA: Diagnosis not present

## 2017-05-13 DIAGNOSIS — F339 Major depressive disorder, recurrent, unspecified: Secondary | ICD-10-CM | POA: Diagnosis not present

## 2017-05-13 MED ORDER — VILAZODONE HCL 20 MG PO TABS: 20.0000 mg | ORAL_TABLET | Freq: Every day | ORAL | 3 refills | Status: DC

## 2017-05-13 NOTE — Progress Notes (Signed)
Subjective:    Patient ID: WATT GEILER, male    DOB: Jan 03, 1951, 66 y.o.   MRN: [id number]  HPI Patient is a 66 year old male who presents to the clinic to follow-up on depression.  He was started on Viibryd at his routine physical exam in June.  He feels like this is helping a lot with depression.  He is very happy with the benefit.  He does admit that the only thing lacking is his desire in doing things that he previously involved.  He has not wanted to get off since starting the medication.  He also admits that he has had no libido.  He is willing to sacrifice those 2 for not wanting to sleep all day and not going to work.  He denies any suicidal or homicidal thoughts.  Patient did wake up this morning with some chest congestion.  He denies any fever, chills, sore throat, sinus pressure, ear pain, shortness of breath or wheezing.  He is not on anything to make this better.  He does feel he is gotten better as he is woken up some.  .. Active Ambulatory Problems    Diagnosis Date Noted  . 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 HYPOGONADISM, MALE 11/04/2006  . HYPERLIPIDEMIA 10/07/2006  . Depression, recurrent ([place]) 11/04/2006  . VARICES OF OTHER SITES 10/04/2009  . CHRONIC RHINITIS 06/04/2007  . PARONYCHIA, FINGER 07/13/2009  . CELLULITIS AND ABSCESS OF OTHER SPECIFIED SITE 05/09/2008  . ACNE ROSACEA 10/07/2006  . SKIN RASH 07/13/2009  . DIARRHEA 03/14/2010  . ABDOMINAL PAIN, GENERALIZED 03/14/2010  . IMPAIRED FASTING GLUCOSE 07/24/2009  . ELEVATED PROSTATE SPECIFIC ANTIGEN 04/10/2009  . LIBIDO, DECREASED 10/07/2006  . History of bowel resection 04/14/2014  . Testosterone deficiency 04/14/2014  . Hyperlipidemia 04/15/2014  . 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 LLQ pain 05/08/2016  . History of diverticulitis 05/08/2016  . 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 Family history of atrial fibrillation 06/11/2016  . Left shoulder pain 11/21/2016  . Toenail fungus  12/16/2016  . No energy 12/17/2016  . Elevated TSH 12/18/2016   Resolved Ambulatory Problems    Diagnosis Date Noted  . VIRAL URI 05/09/2008   Past Medical History:  Diagnosis Date  . Diverticulitis       Review of Systems  All other systems reviewed and are negative.      Objective:   Physical Exam  Constitutional: He is oriented to person, place, and time. He appears well-developed and well-nourished.  HENT:  Head: Normocephalic and atraumatic.  Right Ear: External ear normal.  Left Ear: External ear normal.  Nose: Nose normal.  Mouth/Throat: Oropharynx is clear and moist. No oropharyngeal exudate.  TM's clear.  Negative for any maxillary or frontal sinus tenderness.   Eyes: Conjunctivae are normal. Right eye exhibits no discharge. Left eye exhibits no discharge.  Neck: Normal range of motion. Neck supple.  Cardiovascular: Normal rate, regular rhythm and normal heart sounds.   Pulmonary/Chest: Effort normal. He has wheezes.  Lymphadenopathy:    He has no cervical adenopathy.  Neurological: He is alert and oriented to person, place, and time.  Psychiatric: He has a normal mood and affect. His behavior is normal.          Assessment & Plan:   [name] KitchenMarland KitchenRitvik was seen today for medication refill.  Diagnoses and all orders for this visit:  Depression, recurrent (HCC) -     Vilazodone HCl (VIIBRYD) 20 MG TABS; Take 1 tablet (20 mg total) by mouth  daily.  Anhedonia  .[name] Kitchen Depression screen [facility] 2/9 05/13/2017 12/16/2016  Decreased Interest 3 3  Down, Depressed, Hopeless 0 3  PHQ - 2 Score 3 6  Altered sleeping 0 0  Tired, decreased energy 0 3  Change in appetite 0 0  Feeling bad or failure about yourself  0 3  Trouble concentrating 0 0  Moving slowly or fidgety/restless 0 0  Suicidal thoughts 0 0  PHQ-9 Score 3 12  Difficult doing work/chores Not difficult at all -   .. GAD 7 : Generalized Anxiety Score 05/13/2017 12/16/2016  Nervous, Anxious, on Edge 0 1   Control/stop worrying 0 0  Worry too much - different things 0 0  Trouble relaxing 0 0  Restless 0 0  Easily annoyed or irritable 0 3  Afraid - awful might happen 0 0  Total GAD 7 Score 0 4  Anxiety Difficulty Not difficult at all -     Patient does want to stay on his Viibryd even though he is having low libido and anhedonia.  He has had some weight gain approximately 10 pounds since he has not been going to the gym.  I do think his lack of interest or pleasure in doing things is affecting his overall health.  I would like him to decrease fiber to 10 mg daily to see if it still helps with his depression but also improves his anhedonia.  I did mention adding Wellbutrin to the low-dose Viibryd to see if it would help with his side effects as well.  At this point he is not willing to try this.  He will think about it.  I did go ahead and send refills of viibryd.  Follow up in 6 months or sooner if we need to make some medication changes.   I discussed with ThePatient is chest congestion is likely viral.  He could consider Mucinex and Tylenol Cold sinus severe.  Follow-up as needed.  Consider humidifier in the room during the winter months.  He has gained weight likely due to no desire to do anything. Discussed weight and importance in overall health.  [name] Kitchen.Discussed low carb diet with 1500 calories and 80g of protein.  Exercising at least 150 minutes a week.  [employer] could be a Microbiologist.    [name] Kitchen.Spent 30 minutes with patient and greater than 50 percent of visit spent counseling patient regarding treatment plan.

## 2017-05-13 NOTE — Patient Instructions (Signed)
Cut viibryd in half.  Consider addition of wellbutrin.   .. Discussed 150 minutes of exercise a week.  Encouraged vitamin D 1000 units and Calcium 1300mg  or 4 servings of dairy a day.

## 2017-06-04 ENCOUNTER — Encounter: Payer: Self-pay | Admitting: Physician Assistant

## 2017-06-04 ENCOUNTER — Ambulatory Visit: Payer: BC Managed Care – PPO | Admitting: Physician Assistant

## 2017-06-04 VITALS — BP 115/70 | HR 81 | Temp 98.5°F | Ht 74.0 in | Wt 231.0 lb

## 2017-06-04 DIAGNOSIS — J069 Acute upper respiratory infection, unspecified: Secondary | ICD-10-CM | POA: Diagnosis not present

## 2017-06-04 NOTE — Progress Notes (Addendum)
   Subjective:    Patient ID: Marc Lowe, male    DOB: 1950/10/15, 66 y.o.   MRN: [id number]  HPI Marc Lowe is a 66 y/o male who presents today with rhinorrhea, sore throat and cough. He states that the symptoms began on Sunday and have gotten worse. He states that his wife was sick from Friday to Sunday and that he is experiencing the same symptoms. He reports nasal congestion, clear rhinorrhea, ear congestion, post nasal drip, burning/itching eyes, headache body aches at night, and nonproductive cough. He denies wheezing or shortness of breath but states that he woke up last night having difficulty breathing due to the post nasal drip and his wife got concerned and asked him to be seen. He denies fever, chills, nausea, vomiting, and lymphadenopathy.  Review of Systems  Constitutional: Positive for fatigue. Negative for chills and fever.  HENT: Positive for congestion, sinus pressure (mild) and sore throat. Negative for sinus pain.   Respiratory: Positive for cough. Negative for shortness of breath and wheezing.   Gastrointestinal: Negative for nausea and vomiting.  Neurological: Positive for headaches.       Objective:   Physical Exam  Constitutional: He appears well-developed and well-nourished.  HENT:  Head: Normocephalic and atraumatic.  Left Ear: No drainage, swelling or tenderness. Tympanic membrane is not injected, not scarred, not perforated, not erythematous, not retracted and not bulging. A middle ear effusion (mild amount of fluid seen behind TM) is present.  Mouth/Throat: Uvula is midline and mucous membranes are normal. Posterior oropharyngeal erythema present. No oropharyngeal exudate, posterior oropharyngeal edema or tonsillar abscesses.  Right ear impacted with cerumen, unable to visualize TM.  Cardiovascular: Normal rate and regular rhythm.  Pulmonary/Chest: Effort normal and breath sounds normal. He has no wheezes. He has no rales.  Lymphadenopathy:    He has no  cervical adenopathy.  Psychiatric: He has a normal mood and affect. His behavior is normal. Thought content normal.      Assessment & Plan:  [name] KitchenMarland KitchenShivansh was seen today for nasal congestion and itchy eyes.  Diagnoses and all orders for this visit:  Acute upper respiratory infection   1. Flu swab negative. Patient advised to take Tylenol cold & flu, rest, and drink plenty of fluids.reassurance given that vitals and physical exam was negative for bacterial causes of infection.  2. Advised patient that if his symptoms worsen or he develops shortness of breath and wheezing that he should contact the clinic and follow-up.

## 2017-06-04 NOTE — Patient Instructions (Addendum)
Tylenol cold sinus severe humidifer Cough drops   Upper Respiratory Infection, Adult Most upper respiratory infections (URIs) are caused by a virus. A URI affects the nose, throat, and upper air passages. The most common type of URI is often called "the common cold." Follow these instructions at home:  Take medicines only as told by your doctor.  Gargle warm saltwater or take cough drops to comfort your throat as told by your doctor.  Use a warm mist humidifier or inhale steam from a shower to increase air moisture. This may make it easier to breathe.  Drink enough fluid to keep your pee (urine) clear or pale yellow.  Eat soups and other clear broths.  Have a healthy diet.  Rest as needed.  Go back to work when your fever is gone or your doctor says it is okay. ? You may need to stay home longer to avoid giving your URI to others. ? You can also wear a face mask and wash your hands often to prevent spread of the virus.  Use your inhaler more if you have asthma.  Do not use any tobacco products, including cigarettes, chewing tobacco, or electronic cigarettes. If you need help quitting, ask your doctor. Contact a doctor if:  You are getting worse, not better.  Your symptoms are not helped by medicine.  You have chills.  You are getting more short of breath.  You have brown or red mucus.  You have yellow or brown discharge from your nose.  You have pain in your face, especially when you bend forward.  You have a fever.  You have puffy (swollen) neck glands.  You have pain while swallowing.  You have white areas in the back of your throat. Get help right away if:  You have very bad or constant: ? Headache. ? Ear pain. ? Pain in your forehead, behind your eyes, and over your cheekbones (sinus pain). ? Chest pain.  You have long-lasting (chronic) lung disease and any of the following: ? Wheezing. ? Long-lasting cough. ? Coughing up blood. ? A change in your  usual mucus.  You have a stiff neck.  You have changes in your: ? Vision. ? Hearing. ? Thinking. ? Mood. This information is not intended to replace advice given to you by your health care provider. Make sure you discuss any questions you have with your health care provider. Document Released: 12/18/2007 Document Revised: 03/03/2016 Document Reviewed: 10/06/2013 Elsevier Interactive Patient Education  2018 [employer].

## 2018-04-15 ENCOUNTER — Telehealth: Payer: Self-pay

## 2018-04-15 NOTE — Telephone Encounter (Signed)
Ok to send with 2 refills but will need OV in November.

## 2018-04-15 NOTE — Telephone Encounter (Signed)
Marc Lowe called today and was wanting another refill on his Viibryd. I saw that he has not been into the office since last year, so I just wanted to double check with you first. Thanks!

## 2018-04-16 ENCOUNTER — Other Ambulatory Visit: Payer: Self-pay

## 2018-04-16 DIAGNOSIS — F339 Major depressive disorder, recurrent, unspecified: Secondary | ICD-10-CM

## 2018-04-16 MED ORDER — VILAZODONE HCL 20 MG PO TABS: 20.0000 mg | ORAL_TABLET | Freq: Every day | ORAL | 2 refills | Status: DC

## 2018-04-16 NOTE — Telephone Encounter (Signed)
Done. Sent over [place] with 2 refills and patient is making an appointment to see you in November.

## 2018-05-06 ENCOUNTER — Ambulatory Visit (INDEPENDENT_AMBULATORY_CARE_PROVIDER_SITE_OTHER): Payer: Medicare Other | Admitting: Physician Assistant

## 2018-05-06 ENCOUNTER — Encounter: Payer: Self-pay | Admitting: Physician Assistant

## 2018-05-06 VITALS — BP 133/82 | HR 64 | Ht 74.0 in | Wt 224.0 lb

## 2018-05-06 DIAGNOSIS — M79652 Pain in left thigh: Secondary | ICD-10-CM

## 2018-05-06 DIAGNOSIS — Z23 Encounter for immunization: Secondary | ICD-10-CM | POA: Diagnosis not present

## 2018-05-06 DIAGNOSIS — M25512 Pain in left shoulder: Secondary | ICD-10-CM | POA: Diagnosis not present

## 2018-05-06 DIAGNOSIS — Z0000 Encounter for general adult medical examination without abnormal findings: Secondary | ICD-10-CM

## 2018-05-06 DIAGNOSIS — G8929 Other chronic pain: Secondary | ICD-10-CM | POA: Diagnosis not present

## 2018-05-06 DIAGNOSIS — R6882 Decreased libido: Secondary | ICD-10-CM | POA: Diagnosis not present

## 2018-05-06 DIAGNOSIS — F339 Major depressive disorder, recurrent, unspecified: Secondary | ICD-10-CM | POA: Diagnosis not present

## 2018-05-06 MED ORDER — DICLOFENAC SODIUM 1 % TD GEL: 4.0000 g | Freq: Four times a day (QID) | TRANSDERMAL | 1 refills | Status: DC

## 2018-05-06 NOTE — Patient Instructions (Signed)

## 2018-05-06 NOTE — Progress Notes (Signed)
Subjective:    Patient ID: Marc Lowe, male    DOB: [birth year], 67 y.o.   MRN: [id number]  HPI  Pt is a 67 yo male with Depression who presents to the clinic for follow up and medication refills.   Pt doing great. No problems with mood. Loves viibryd. No problems or concerns. No SI/HC.   Continues to have years of left shoulder pain. Off and on symptoms. 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 Ibuprofen helps a lot. No strength changes.   1 week ago had a cramp that last for 15 minutes of left inner thigh. stilll sore today.   .. Active Ambulatory Problems    Diagnosis Date Noted  . 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 HYPOGONADISM, MALE 11/04/2006  . HYPERLIPIDEMIA 10/07/2006  . Depression, recurrent ([place]) 11/04/2006  . VARICES OF OTHER SITES 10/04/2009  . CHRONIC RHINITIS 06/04/2007  . PARONYCHIA, FINGER 07/13/2009  . CELLULITIS AND ABSCESS OF OTHER SPECIFIED SITE 05/09/2008  . ACNE ROSACEA 10/07/2006  . SKIN RASH 07/13/2009  . DIARRHEA 03/14/2010  . ABDOMINAL PAIN, GENERALIZED 03/14/2010  . IMPAIRED FASTING GLUCOSE 07/24/2009  . ELEVATED PROSTATE SPECIFIC ANTIGEN 04/10/2009  . LIBIDO, DECREASED 10/07/2006  . History of bowel resection 04/14/2014  . Testosterone deficiency 04/14/2014  . Hyperlipidemia 04/15/2014  . 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 LLQ pain 05/08/2016  . History of diverticulitis 05/08/2016  . 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 Family history of atrial fibrillation 06/11/2016  . Left shoulder pain 11/21/2016  . Toenail fungus 12/16/2016  . No energy 12/17/2016  . Elevated TSH 12/18/2016  . Anhedonia 05/13/2017  . Abnormal weight gain 05/13/2017   Resolved Ambulatory Problems    Diagnosis Date Noted  . VIRAL URI 05/09/2008   Past Medical History:  Diagnosis Date  . Diverticulitis      Review of Systems See HPI.     Objective:   Physical Exam  Constitutional: He is oriented  to person, place, and time. He appears well-developed and well-nourished.  HENT:  Head: Normocephalic and atraumatic.  Eyes: Conjunctivae are normal.  Neck: Normal range of motion. Neck supple.  Cardiovascular: Normal rate and regular rhythm.  Pulmonary/Chest: Effort normal and breath sounds normal.  Neurological: He is alert and oriented to person, place, and time.  Psychiatric: He has a normal mood and affect. His behavior is normal.          Assessment & Plan:  [name] KitchenMarland KitchenDiagnoses and all orders for this visit:  Depression, recurrent ([place])  Low libido -     Testosterone  Preventative health care -     Lipid Panel w/reflex Direct LDL -     COMPLETE METABOLIC PANEL WITH GFR -     Testosterone  Need for pneumococcal vaccine -     Pneumococcal polysaccharide vaccine 23-valent greater than or equal to 2yo subcutaneous/IM  Chronic left shoulder pain -     diclofenac sodium (VOLTAREN) 1 % GEL; Apply 4 g topically 4 (four) times daily. To affected joint.  Acute thigh pain, left   .[name] Kitchen Depression screen [facility] 2/9 05/06/2018 05/13/2017 12/16/2016  Decreased Interest 1 3 3   Down, Depressed, Hopeless 0 0 3  PHQ - 2 Score 1 3 6   Altered sleeping 0 0 0  Tired, decreased energy 0 0 3  Change in appetite 0 0 0  Feeling bad or failure about yourself  0 0 3  Trouble concentrating 0 0  0  Moving slowly or fidgety/restless 0 0 0  Suicidal thoughts 0 0 0  PHQ-9 Score 1 3 12   Difficult doing work/chores Not difficult at all Not difficult at all -   .. GAD 7 : Generalized Anxiety Score 05/06/2018 05/13/2017 05/13/2017 12/16/2016  Nervous, Anxious, on Edge 0 0 0 1  Control/stop worrying 0 0 0 0  Worry too much - different things 0 0 0 0  Trouble relaxing 0 0 0 0  Restless 0 0 0 0  Easily annoyed or irritable 1 0 0 3  Afraid - awful might happen 0 0 0 0  Total GAD 7 Score 1 0 0 4  Anxiety Difficulty Not difficult at all - Not difficult at all -    Continue viibyrd.   Needs fasting labs.    Pneumonia vaccine given.   Diclofenac given to use on left shoulder. Continue NSAIDs as needed and followup with sports medicine for more intervention. He has seen Dr. Georgina Snell in the past.   Can use gel on the thigh post cramp discomfort. Follow up as needed.

## 2018-05-07 LAB — COMPLETE METABOLIC PANEL WITH GFR
AG Ratio: 1.6 (calc) (ref 1.0–2.5)
ALT: 25 U/L (ref 9–46)
AST: 19 U/L (ref 10–35)
Albumin: 4.2 g/dL (ref 3.6–5.1)
Alkaline phosphatase (APISO): 58 U/L (ref 40–115)
BUN: 20 mg/dL (ref 7–25)
CALCIUM: 9.4 mg/dL (ref 8.6–10.3)
CO2: 29 mmol/L (ref 20–32)
Chloride: 103 mmol/L (ref 98–110)
Creat: 0.98 mg/dL (ref 0.70–1.25)
GFR, EST AFRICAN AMERICAN: 93 mL/min/{1.73_m2} (ref >=60)
GFR, EST NON AFRICAN AMERICAN: 81 mL/min/{1.73_m2} (ref >=60)
GLUCOSE: 93 mg/dL (ref 65–99)
Globulin: 2.6 g/dL (calc) (ref 1.9–3.7)
Potassium: 4.6 mmol/L (ref 3.5–5.3)
Sodium: 139 mmol/L (ref 135–146)
TOTAL PROTEIN: 6.8 g/dL (ref 6.1–8.1)
Total Bilirubin: 0.6 mg/dL (ref 0.2–1.2)

## 2018-05-07 LAB — LIPID PANEL W/REFLEX DIRECT LDL
Cholesterol: 201 mg/dL — ABNORMAL HIGH (ref <200)
HDL: 42 mg/dL (ref >40)
LDL Cholesterol (Calc): 138 mg/dL (calc) — ABNORMAL HIGH
NON-HDL CHOLESTEROL (CALC): 159 mg/dL — AB (ref <130)
Total CHOL/HDL Ratio: 4.8 (calc) (ref <5.0)
Triglycerides: 99 mg/dL (ref <150)

## 2018-05-07 LAB — TESTOSTERONE: Testosterone: 578 ng/dL (ref 250–827)

## 2018-05-07 NOTE — Progress Notes (Signed)
Call pt: kidney, liver, glucose and testosterone look great.   Cholesterol not to goal. ACC calculation of 10 year CV risk is 10.6 percent. Guidelines suggest starting a cholesterol medication with anything above 7.5 percent. Is this something you could consider to lower CV risk?

## 2018-05-08 NOTE — Progress Notes (Signed)
We have a lot of different kinds of medications in that class I think it would be worth trying a low dose of them.

## 2018-05-09 ENCOUNTER — Encounter: Payer: Self-pay | Admitting: Physician Assistant

## 2019-05-19 NOTE — Progress Notes (Signed)
Subjective:   Marc Lowe is a 68 y.o. male who presents for Medicare Annual/Subsequent preventive examination.  Review of Systems:  No ROS.  Medicare Wellness Virtual Visit.  Visual/audio telehealth visit, UTA vital signs.   See social history for additional risk factors.    Cardiac Risk Factors include: advanced age (>30men, >48 women);dyslipidemia;male gender  Sleep patterns: Getting 5 hours of sleep a night. Wakes up 1 time a night to void. Wakes up and feels rested and ready for the day.  Home Safety/Smoke Alarms: Feels safe in home. Smoke alarms in place.  Living environment;Lives with wife in 2 story home. Stairs have hand rails on them. Shower is a walk in shower  And anti slip mat in place as well as grab bars. Seat Belt Safety/Bike Helmet: Wears seat belt.    Male:   CCS-  ordered   PSA- ordered Lab Results  Component Value Date   PSA 4.1 (H) 12/16/2016   PSA 4.98 (H) 02/09/2015   PSA 2.45 06/06/2009        Objective:    Vitals: BP 133/88   Pulse 96   Temp 98 F (36.7 C) (Oral)   Ht 6\' 2"  (1.88 m)   Wt 225 lb (102.1 kg)   SpO2 99%   BMI 28.89 kg/m   Body mass index is 28.89 kg/m.  Advanced Directives 05/25/2019 05/04/2015  Does Patient Have a Medical Advance Directive? Yes Yes  Type of Paramedic of [place];Living will -  Does patient want to make changes to medical advance directive? No - Patient declined -  Copy of [place] in Chart? No - copy requested No - copy requested    Tobacco Social History   Tobacco Use  Smoking Status Never Smoker  Smokeless Tobacco Never Used     Counseling given: No   Clinical Intake:  Pre-visit preparation completed: Yes  Pain : No/denies pain     Nutritional Risks: None Diabetes: No  How often do you need to have someone help you when you read instructions, pamphlets, or other written materials from your doctor or pharmacy?: 1 - Never What is the last  grade level you completed in school?: 14  Interpreter Needed?: No  Information entered by :: Orlie Dakin, LPN  Past Medical History:  Diagnosis Date  . Diverticulitis    History reviewed. No pertinent surgical history. Family History  Problem Relation Age of Onset  . Atrial fibrillation Father    Social History   Socioeconomic History  . Marital status: Married    Spouse name: Tillman Abide  . Number of children: 4  . Years of education: 79  . Highest education level: Bachelor's degree (e.g., BA, AB, BS)  Occupational History  . 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 Financial resource strain: Not hard at all  . Food insecurity    Worry: Never true    Inability: Never true  . Transportation needs    Medical: No    Non-medical: No  Tobacco Use  . Smoking status: Never Smoker  . Smokeless tobacco: Never Used  Substance and Sexual Activity  . Alcohol use: Yes    Alcohol/week: 1.0 standard drinks    Types: 1 Glasses of wine per week    Comment: 4 ounces every couple of days  . Drug use: No  . Sexual activity: Yes    Partners: Female  Lifestyle  . Physical activity    Days  per week: 0 days    Minutes per session: 0 min  . Stress: Not at all  Relationships  . Social Herbalist on phone: Three times a week    Gets together: Once a week    Attends religious service: Never    Active member of club or organization: No    Attends meetings of clubs or organizations: Never    Relationship status: Married  Other Topics Concern  . Not on file  Social History Narrative   Plays golf once a week. 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 Sildenafil Citrate (VIAGRA PO) Take by mouth.  . Vilazodone HCl (VIIBRYD) 20 MG TABS Take 1 tablet (20 mg total) by mouth daily. Patient is due for follow-up appointment with PCP for future refills.  . diclofenac sodium (VOLTAREN)  1 % GEL Apply 4 g topically 4 (four) times daily. To affected joint. (Patient not taking: Reported on 05/25/2019)   No facility-administered encounter medications on file as of 05/25/2019.     Activities of Daily Living In your present state of health, do you have any difficulty performing the following activities: 05/25/2019  Hearing? N  Vision? N  Difficulty concentrating or making decisions? Y  Comment sometimes  Walking or climbing stairs? N  Dressing or bathing? N  Doing errands, shopping? N  Preparing Food and eating ? N  Using the Toilet? N  In the past six months, have you accidently leaked urine? N  Do you have problems with loss of bowel control? N  Managing your Medications? N  Managing your Finances? N  Some recent data might be hidden    Patient Care Team: Lavada Mesi as PCP - General (Family Medicine)   Assessment:   This is a routine wellness examination for Onofre.Physical assessment deferred to PCP.   Exercise Activities and Dietary recommendations Current Exercise Habits: The patient has a physically strenuous job, but has no regular exercise apart from work. Diet  Eats a healthy diet Breakfast: eggs, sausage and cheese Lunch: skips Dinner: Meat and vegetables    Drinks water daily  Goals    . DIET - INCREASE WATER INTAKE     Drink more water daily       Fall Risk Fall Risk  05/25/2019  Falls in the past year? 0  Number falls in past yr: 0  Injury with Fall? 0  Follow up Falls prevention discussed   Is the patient's home free of loose throw rugs in walkways, pet beds, electrical cords, etc?   yes      Grab bars in the bathroom? yes      Handrails on the stairs?   yes      Adequate lighting?   yes   Depression Screen PHQ 2/9 Scores 05/25/2019 05/06/2018 05/13/2017 12/16/2016  PHQ - 2 Score 0 1 3 6   PHQ- 9 Score - 1 3 12   Exception Documentation Medical reason - - -    Cognitive Function     6CIT Screen 05/25/2019  What  Year? 0 points  What month? 0 points  What time? 0 points  Count back from 20 0 points  Months in reverse 0 points  Repeat phrase 0 points  Total Score 0    Immunization History  Administered Date(s) Administered  . Pneumococcal Polysaccharide-23 05/06/2018  . Tdap 04/14/2014  . Zoster 04/14/2014    Screening  Tests Health Maintenance  Topic Date Due  . COLONOSCOPY  07/15/2018  . INFLUENZA VACCINE  02/13/2019  . PNA vac Low Risk Adult (2 of 2 - PCV13) 05/07/2019  . TETANUS/TDAP  04/14/2024  . Hepatitis C Screening  Completed        Plan:    Please schedule your next medicare wellness visit with me in 1 yr.  Mr. Personius , Thank you for taking time to come for your Medicare Wellness Visit. I appreciate your ongoing commitment to your health goals. Please review the following plan we discussed and let me know if I can assist you in the future.  Continue doing brain stimulating activities (puzzles, reading, adult coloring books, staying active) to keep memory sharp.   These are the goals we discussed: Goals    . DIET - INCREASE WATER INTAKE     Drink more water daily       This is a list of the screening recommended for you and due dates:  Health Maintenance  Topic Date Due  . Colon Cancer Screening  07/15/2018  . Flu Shot  02/13/2019  . Pneumonia vaccines (2 of 2 - PCV13) 05/07/2019  . Tetanus Vaccine  04/14/2024  .  Hepatitis C: One time screening is recommended by Center for Disease Control  (CDC) for  adults born from [birth year] through 1964.   Completed     I have personally reviewed and noted the following in the patient's chart:   . Medical and social history . Use of alcohol, tobacco or illicit drugs  . Current medications and supplements . Functional ability and status . Nutritional status . Physical activity . Advanced directives . List of other physicians . Hospitalizations, surgeries, and ER visits in previous 12 months . Vitals . Screenings to include  cognitive, depression, and falls . Referrals and appointments  In addition, I have reviewed and discussed with patient certain preventive protocols, quality metrics, and best practice recommendations. A written personalized care plan for preventive services as well as general preventive health recommendations were provided to patient.     Joanne Chars, LPN  [phone]

## 2019-05-25 ENCOUNTER — Other Ambulatory Visit: Payer: Self-pay

## 2019-05-25 ENCOUNTER — Ambulatory Visit (INDEPENDENT_AMBULATORY_CARE_PROVIDER_SITE_OTHER): Payer: Medicare Other | Admitting: *Deleted

## 2019-05-25 ENCOUNTER — Encounter: Payer: Medicare Other | Admitting: Physician Assistant

## 2019-05-25 VITALS — BP 133/88 | HR 96 | Temp 98.0°F | Ht 74.0 in | Wt 225.0 lb

## 2019-05-25 DIAGNOSIS — N400 Enlarged prostate without lower urinary tract symptoms: Secondary | ICD-10-CM | POA: Diagnosis not present

## 2019-05-25 DIAGNOSIS — Z0000 Encounter for general adult medical examination without abnormal findings: Secondary | ICD-10-CM

## 2019-05-25 DIAGNOSIS — E782 Mixed hyperlipidemia: Secondary | ICD-10-CM

## 2019-05-25 DIAGNOSIS — Z125 Encounter for screening for malignant neoplasm of prostate: Secondary | ICD-10-CM

## 2019-05-25 DIAGNOSIS — Z1211 Encounter for screening for malignant neoplasm of colon: Secondary | ICD-10-CM

## 2019-05-25 NOTE — Patient Instructions (Signed)
Please schedule your next medicare wellness visit with me in 1 yr.  Marc Lowe , Thank you for taking time to come for your Medicare Wellness Visit. I appreciate your ongoing commitment to your health goals. Please review the following plan we discussed and let me know if I can assist you in the future.  Continue doing brain stimulating activities (puzzles, reading, adult coloring books, staying active) to keep memory sharp. These are the goals we discussed: Goals    . DIET - INCREASE WATER INTAKE     Drink more water daily

## 2019-05-26 LAB — COMPLETE METABOLIC PANEL WITH GFR
AG Ratio: 1.9 (calc) (ref 1.0–2.5)
ALT: 19 U/L (ref 9–46)
AST: 18 U/L (ref 10–35)
Albumin: 4.1 g/dL (ref 3.6–5.1)
Alkaline phosphatase (APISO): 61 U/L (ref 35–144)
BUN: 17 mg/dL (ref 7–25)
CO2: 30 mmol/L (ref 20–32)
Calcium: 9.5 mg/dL (ref 8.6–10.3)
Chloride: 105 mmol/L (ref 98–110)
Creat: 0.96 mg/dL (ref 0.70–1.25)
GFR, Est African American: 95 mL/min/{1.73_m2} (ref >=60)
GFR, Est Non African American: 82 mL/min/{1.73_m2} (ref >=60)
Globulin: 2.2 g/dL (calc) (ref 1.9–3.7)
Glucose, Bld: 102 mg/dL — ABNORMAL HIGH (ref 65–99)
Potassium: 5.1 mmol/L (ref 3.5–5.3)
Sodium: 141 mmol/L (ref 135–146)
Total Bilirubin: 0.5 mg/dL (ref 0.2–1.2)
Total Protein: 6.3 g/dL (ref 6.1–8.1)

## 2019-05-26 LAB — LIPID PANEL
Cholesterol: 221 mg/dL — ABNORMAL HIGH (ref <200)
HDL: 50 mg/dL (ref >=40)
LDL Cholesterol (Calc): 151 mg/dL (calc) — ABNORMAL HIGH
Non-HDL Cholesterol (Calc): 171 mg/dL (calc) — ABNORMAL HIGH (ref <130)
Total CHOL/HDL Ratio: 4.4 (calc) (ref <5.0)
Triglycerides: 92 mg/dL (ref <150)

## 2019-05-26 LAB — PSA: PSA: 3.6 ng/mL (ref <=4.0)

## 2019-05-26 NOTE — Progress Notes (Signed)
Marc Lowe,   PSA decreased from 2 years ago. Kidney and liver look good. Fasting sugars are a little elevated.  Your LDL has increased from 1 year ago. Mostly due to age and sex your overall cardiovascular risk is 11.9 percent. This is over the 7.5 percent that AHA recommends cholesterol treatment. I know we have discussed this in the past but I think a statin would be a good idea.   Luvenia Starch

## 2019-05-27 MED ORDER — COLESEVELAM HCL 625 MG PO TABS: 1875.0000 mg | ORAL_TABLET | Freq: Two times a day (BID) | ORAL | 11 refills | Status: DC

## 2019-05-27 NOTE — Progress Notes (Signed)
There is a non statin medication called welchol that can help lower cholesterol by binding to it in the gut and pulling it out. Would you be interested in trying that?

## 2019-05-27 NOTE — Addendum Note (Signed)
Addended by: Donella Stade on: 05/27/2019 02:25 PM   Modules accepted: Orders

## 2019-05-27 NOTE — Progress Notes (Signed)
Done

## 2019-07-02 ENCOUNTER — Ambulatory Visit (INDEPENDENT_AMBULATORY_CARE_PROVIDER_SITE_OTHER): Payer: Medicare Other | Admitting: Sports Medicine

## 2019-07-02 ENCOUNTER — Telehealth: Payer: Self-pay | Admitting: Physician Assistant

## 2019-07-02 ENCOUNTER — Ambulatory Visit: Payer: Medicare Other

## 2019-07-02 ENCOUNTER — Encounter: Payer: Self-pay | Admitting: Sports Medicine

## 2019-07-02 ENCOUNTER — Telehealth: Payer: Self-pay | Admitting: Sports Medicine

## 2019-07-02 ENCOUNTER — Other Ambulatory Visit: Payer: Self-pay

## 2019-07-02 DIAGNOSIS — R2242 Localized swelling, mass and lump, left lower limb: Secondary | ICD-10-CM | POA: Diagnosis not present

## 2019-07-02 NOTE — Telephone Encounter (Signed)
Spoke with patient and doubled booked with Dr. Darene Lamer for 3:30 this afternoon. KG LPN

## 2019-07-02 NOTE — Telephone Encounter (Signed)
Patient was in the office for a visit with Dr. Darene Lamer and he stated that he has not heard from the insurance about his [place]. I had amber tell him we would check to see what was needed for a PA for his medicine.

## 2019-07-02 NOTE — Telephone Encounter (Signed)
Patient calls into triage and states for 2 1/2 days his left leg just above achilles area is red, swollen and warm to touch. Hurts when pushes on the area. Some pain when walking. Had achilles repaired 10 years ago. Going to [place] for Christmas vacation and wanted to know thoughts on what it could be. Had no injury to area that he knows of.

## 2019-07-02 NOTE — Progress Notes (Signed)
Subjective:    CC: Left leg swelling  HPI: For the past few days this pleasant 68 year old male has had swelling in his left lower leg, just below the sock line, minimal tenderness.  No history of DVTs, he does have history of venous stasis post foam sclerotherapy.  Symptoms are mild, persistent.  I reviewed the past medical history, family history, social history, surgical history, and allergies today and no changes were needed.  Please see the problem list section below in epic for further details.  Past Medical History: Past Medical History:  Diagnosis Date  . Diverticulitis    Past Surgical History: No past surgical history on file. Social History: Social History   Socioeconomic History  . Marital status: Married    Spouse name: Tillman Abide  . Number of children: 4  . Years of education: 48  . Highest education level: Bachelor's degree (e.g., BA, AB, BS)  Occupational History  . Occupation: Chief Strategy Officer    Comment: retired  Tobacco Use  . Smoking status: Never Smoker  . Smokeless tobacco: Never Used  Substance and Sexual Activity  . Alcohol use: Yes    Alcohol/week: 1.0 standard drinks    Types: 1 Glasses of wine per week    Comment: 4 ounces every couple of days  . Drug use: No  . Sexual activity: Yes    Partners: Female  Other Topics Concern  . Not on file  Social History Narrative   Plays golf once a week. 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 Difficulty of Paying Living Expenses: Not hard at all  Food Insecurity: No Food Insecurity  . Worried About Charity fundraiser in the Last Year: Never true  . Ran Out of Food in the Last Year: Never true  Transportation Needs: No Transportation Needs  . Lack of Transportation (Medical): No  . Lack of Transportation (Non-Medical): No  Physical Activity: Inactive  . Days of Exercise per Week: 0 days  . Minutes of Exercise per Session:  0 min  Stress: No Stress Concern Present  . Feeling of Stress : Not at all  Social Connections: Somewhat Isolated  . Frequency of Communication with Friends and Family: Three times a week  . Frequency of Social Gatherings with Friends and Family: Once a week  . Attends Religious Services: Never  . Active Member of Clubs or Organizations: No  . Attends Archivist Meetings: Never  . Marital Status: Married   Family History: Family History  Problem Relation Age of Onset  . Atrial fibrillation Father    Allergies: Allergies  Allergen Reactions  . Atorvastatin     Nausea, reflux, abdominal pain  . Rosuvastatin     REACTION: n/v   Medications: See med rec.  Review of Systems: No fevers, chills, night sweats, weight loss, chest pain, or shortness of breath.   Objective:    General: Well Developed, well nourished, and in no acute distress.  Neuro: Alert and oriented x3, extra-ocular muscles intact, sensation grossly intact.  HEENT: Normocephalic, atraumatic, pupils equal round reactive to light, neck supple, no masses, no lymphadenopathy, thyroid nonpalpable.  Skin: Warm and dry, no rashes. Cardiac: Regular rate and rhythm, no murmurs rubs or gallops, no lower extremity edema.  Respiratory: Clear to auscultation bilaterally. Not using accessory muscles, speaking in full sentences. Left leg: Small area of pitting edema over the lower leg just below the sock  line.  Negative Homans' sign, neurovascularly intact distally.  Impression and Recommendations:    Localized swelling of left lower extremity Means, history of venous stasis post sclerotherapy. Now with new onset swelling of his left lower extremity, localized just above the ankle. Adding a stat DVT ultrasound, he will also wear lower extremity compression hose on both sides, and return to see me in 2 weeks.  Venous ultrasound shows simple venous insufficiently within large saphenous vein, treatment will be elevation  and compression hose.  If this fails then certainly we can consider Unna boot for a few weeks versus referral back to vascular surgery for vein stripping.   ___________________________________________ Gwen Her. Dianah Field, M.D., ABFM., CAQSM. Primary Care and Sports Medicine [facility]  Adjunct Professor of [employer]

## 2019-07-02 NOTE — Telephone Encounter (Signed)
Differential is very broad with these complaints, anything from DVT, cellulitis, retrocalcaneal versus calcaneal bursitis, this is going to need an examination.  Does he have time to run in for me to look at it?  We could double book him somewhere in the afternoon.  I am happy to double book anybody that needs to be seen today since I am the only provider, but we will absolutely need two nurses back here instead of 1.

## 2019-07-02 NOTE — Assessment & Plan Note (Addendum)
Means, history of venous stasis post sclerotherapy. Now with new onset swelling of his left lower extremity, localized just above the ankle. Adding a stat DVT ultrasound, he will also wear lower extremity compression hose on both sides, and return to see me in 2 weeks.  Venous ultrasound shows simple venous insufficiently within large saphenous vein, treatment will be elevation and compression hose.  If this fails then certainly we can consider Unna boot for a few weeks versus referral back to vascular surgery for vein stripping.

## 2019-07-06 NOTE — Telephone Encounter (Signed)
I have been trying today on covermymeds to work on the [place] and per [facility] this medication is not covered at all and does not give the option for a PA. Do you have any other recommendations? Please advise.

## 2019-07-07 ENCOUNTER — Other Ambulatory Visit: Payer: Self-pay | Admitting: Physician Assistant

## 2019-07-07 MED ORDER — EZETIMIBE 10 MG PO TABS: 10.0000 mg | ORAL_TABLET | Freq: Every day | ORAL | 3 refills | Status: DC

## 2019-07-07 NOTE — Telephone Encounter (Signed)
We can try zetia. Obviously the best choice as far as effectiviness is a statin but this can have some modest effect on LDL. Sent to pharmacy to try.

## 2019-07-07 NOTE — Progress Notes (Signed)
z

## 2019-07-07 NOTE — Telephone Encounter (Signed)
Left brief VM for patient that Marc Lowe has sent in a new prescription since the [facility] is not covered on the patients plan. Patient was asked to call back with any questions.

## 2019-07-12 NOTE — Telephone Encounter (Signed)
Left message for the patient that PCP has sent in a new prescription to the pharmacy since [facility] was not covered by his insurance. Patient was asked to call back with any questions.

## 2019-07-15 NOTE — Telephone Encounter (Signed)
No response from patient. Closing encounter.

## 2019-10-07 DIAGNOSIS — K621 Rectal polyp: Secondary | ICD-10-CM | POA: Diagnosis not present

## 2019-10-07 DIAGNOSIS — K644 Residual hemorrhoidal skin tags: Secondary | ICD-10-CM | POA: Diagnosis not present

## 2019-10-07 DIAGNOSIS — Z1211 Encounter for screening for malignant neoplasm of colon: Secondary | ICD-10-CM | POA: Diagnosis not present

## 2019-10-07 DIAGNOSIS — D128 Benign neoplasm of rectum: Secondary | ICD-10-CM | POA: Diagnosis not present

## 2019-10-07 DIAGNOSIS — K648 Other hemorrhoids: Secondary | ICD-10-CM | POA: Diagnosis not present

## 2019-10-07 LAB — HM COLONOSCOPY

## 2019-10-11 ENCOUNTER — Encounter: Payer: Self-pay | Admitting: Physician Assistant

## 2019-10-26 DIAGNOSIS — K621 Rectal polyp: Secondary | ICD-10-CM | POA: Diagnosis not present

## 2019-11-16 ENCOUNTER — Telehealth: Payer: Self-pay | Admitting: Neurology

## 2019-11-16 NOTE — Telephone Encounter (Signed)
Patient left vm stating he stopped Zetia because it make him "feel bad". He has been off for two weeks and feeling better. He just wanted to let us know. I removed from his medication list.   He could not take Welchol due to insurance denying RX. Can not taken Statins. FYI.

## 2019-12-08 DIAGNOSIS — Z86010 Personal history of colonic polyps: Secondary | ICD-10-CM | POA: Diagnosis not present

## 2019-12-08 DIAGNOSIS — Z79899 Other long term (current) drug therapy: Secondary | ICD-10-CM | POA: Diagnosis not present

## 2019-12-08 DIAGNOSIS — K621 Rectal polyp: Secondary | ICD-10-CM | POA: Diagnosis not present

## 2019-12-08 DIAGNOSIS — D128 Benign neoplasm of rectum: Secondary | ICD-10-CM | POA: Diagnosis not present

## 2019-12-08 DIAGNOSIS — F329 Major depressive disorder, single episode, unspecified: Secondary | ICD-10-CM | POA: Diagnosis not present

## 2019-12-09 DIAGNOSIS — F329 Major depressive disorder, single episode, unspecified: Secondary | ICD-10-CM | POA: Diagnosis not present

## 2019-12-09 DIAGNOSIS — D128 Benign neoplasm of rectum: Secondary | ICD-10-CM | POA: Diagnosis not present

## 2019-12-09 DIAGNOSIS — Z86010 Personal history of colonic polyps: Secondary | ICD-10-CM | POA: Diagnosis not present

## 2019-12-09 DIAGNOSIS — Z79899 Other long term (current) drug therapy: Secondary | ICD-10-CM | POA: Diagnosis not present

## 2019-12-09 DIAGNOSIS — K621 Rectal polyp: Secondary | ICD-10-CM | POA: Diagnosis not present

## 2020-02-10 DIAGNOSIS — K621 Rectal polyp: Secondary | ICD-10-CM | POA: Diagnosis not present

## 2020-02-18 ENCOUNTER — Ambulatory Visit (INDEPENDENT_AMBULATORY_CARE_PROVIDER_SITE_OTHER): Payer: Medicare Other | Admitting: Physician Assistant

## 2020-02-18 ENCOUNTER — Encounter: Payer: Self-pay | Admitting: Physician Assistant

## 2020-02-18 ENCOUNTER — Other Ambulatory Visit: Payer: Self-pay

## 2020-02-18 VITALS — BP 114/90 | HR 74 | Ht 74.0 in | Wt 226.0 lb

## 2020-02-18 DIAGNOSIS — L821 Other seborrheic keratosis: Secondary | ICD-10-CM

## 2020-02-18 DIAGNOSIS — M5030 Other cervical disc degeneration, unspecified cervical region: Secondary | ICD-10-CM | POA: Diagnosis not present

## 2020-02-18 DIAGNOSIS — M5412 Radiculopathy, cervical region: Secondary | ICD-10-CM | POA: Diagnosis not present

## 2020-02-18 DIAGNOSIS — R413 Other amnesia: Secondary | ICD-10-CM | POA: Diagnosis not present

## 2020-02-18 DIAGNOSIS — E782 Mixed hyperlipidemia: Secondary | ICD-10-CM | POA: Diagnosis not present

## 2020-02-18 MED ORDER — PREDNISONE 50 MG PO TABS: ORAL_TABLET | ORAL | 0 refills | Status: DC

## 2020-02-18 NOTE — Progress Notes (Signed)
Subjective:    Patient ID: Marc Lowe, male    DOB: 1950/10/25, 69 y.o.   MRN: [id number]  HPI  Pt is a 69 yo male who presents to the clinic with some concerns.   Pt has hx of cervical DDD and nerve root impingement. He has not had problems in a while but for the last month every time he turns his head to the right his right arm and hand and fingers go numb. He went to PT before and helped a lot. He has been to chiropracter and helps but not done anything elese. He has an inversion table at home that helps as well. Symptoms are not constant. No headaches. He has taken aleve before and seems to help.   He notice episode with memory where he gave his old home address instead of new one. His wife caught it. He has always had problems with names. He does not really notice any other problems but just wanted to know if he should be concerned.   He does have a few spots on his leg that don't bother him but wanted looked at.     .. Active Ambulatory Problems    Diagnosis Date Noted  . 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 HYPOGONADISM, MALE 11/04/2006  . Depression, recurrent ([facility]) 11/04/2006  . VARICES OF OTHER SITES 10/04/2009  . CHRONIC RHINITIS 06/04/2007  . PARONYCHIA, FINGER 07/13/2009  . CELLULITIS AND ABSCESS OF OTHER SPECIFIED SITE 05/09/2008  . ACNE ROSACEA 10/07/2006  . SKIN RASH 07/13/2009  . DIARRHEA 03/14/2010  . ABDOMINAL PAIN, GENERALIZED 03/14/2010  . IMPAIRED FASTING GLUCOSE 07/24/2009  . ELEVATED PROSTATE SPECIFIC ANTIGEN 04/10/2009  . LIBIDO, DECREASED 10/07/2006  . History of bowel resection 04/14/2014  . Testosterone deficiency 04/14/2014  . Hyperlipidemia 04/15/2014  . 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 LLQ pain 05/08/2016  . History of diverticulitis 05/08/2016  . 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 Family history of atrial fibrillation 06/11/2016  . Left shoulder pain 11/21/2016  . Toenail fungus 12/16/2016  . No energy 12/17/2016  .  Elevated TSH 12/18/2016  . Anhedonia 05/13/2017  . Abnormal weight gain 05/13/2017  . Localized swelling of left lower extremity 07/02/2019  . Cervical radiculopathy 02/18/2020  . DDD (degenerative disc disease), cervical 02/18/2020  . Mixed hyperlipidemia 02/18/2020  . Memory changes 02/18/2020   Resolved Ambulatory Problems    Diagnosis Date Noted  . HYPERLIPIDEMIA 10/07/2006  . VIRAL URI 05/09/2008   Past Medical History:  Diagnosis Date  . Diverticulitis       Review of Systems   see HPI.  Objective:   Physical Exam Vitals reviewed.  Constitutional:      Appearance: Normal appearance.  Cardiovascular:     Rate and Rhythm: Normal rate and regular rhythm.     Pulses: Normal pulses.  Pulmonary:     Effort: Pulmonary effort is normal.     Breath sounds: Normal breath sounds.  Musculoskeletal:     Comments: Numbness and tingling when turns head to the right.  NROm of neck.  NROM of right shoulder.  Hand grip 5/5.   Skin:    Comments: Brown raised waxy verruca like macules on legs.   Neurological:     General: No focal deficit present.     Mental Status: He is alert and oriented to person, place, and time.  Psychiatric:        Mood and Affect: Mood normal.  Assessment & Plan:  [name] KitchenMarland KitchenGus was seen today for numbness.  Diagnoses and all orders for this visit:  DDD (degenerative disc disease), cervical -     Ambulatory referral to Physical Therapy  Right cervical radiculopathy -     predniSONE (DELTASONE) 50 MG tablet; One tab PO daily for 5 days. -     COMPLETE METABOLIC PANEL WITH GFR -     TSH -     CBC with Differential/Platelet -     Sed Rate (ESR) -     Ambulatory referral to Physical Therapy  Memory changes -     TSH -     CBC with Differential/Platelet -     Sed Rate (ESR) -     B12 and Folate Panel  Mixed hyperlipidemia -     Lipid Panel w/reflex Direct LDL   .[name] Kitchen MMSE - Mini Mental State Exam 02/18/2020  Orientation to time 4   Orientation to Place 5  Registration 3  Attention/ Calculation 5  Recall 3  Language- name 2 objects 2  Language- repeat 1  Language- follow 3 step command 3  Language- read & follow direction 1  Write a sentence 1  Copy design 1  Total score 29   MMSE perfect. Labs ordered. Reassurance given that does not appear to be worrisome memory changes. Follow up with any new issues.   PT ordered for right cervical DDD. Use NSaids as needed. Prednisone burst given today. If continues consider Dr. Darene Lamer referral and more imaging.   Reassured patient spots benign SKs.   Follow up as needed or in 6 weeks.

## 2020-02-18 NOTE — Patient Instructions (Addendum)
Cervical Radiculopathy  Cervical radiculopathy means that a nerve in the neck (a cervical nerve) is pinched or bruised. This can happen because of an injury to the cervical spine (vertebrae) in the neck, or as a normal part of getting older. This can cause pain or loss of feeling (numbness) that runs from your neck all the way down to your arm and fingers. Often, this condition gets better with rest. Treatment may be needed if the condition does not get better. What are the causes?  A neck injury.  A bulging disk in your spine.  Muscle movements that you cannot control (muscle spasms).  Tight muscles in your neck due to overuse.  Arthritis.  Breakdown in the bones and joints of the spine (spondylosis) due to getting older.  Bone spurs that form near the nerves in the neck. What are the signs or symptoms?  Pain. The pain may: ? Run from the neck to the arm and hand. ? Be very bad or irritating. ? Be worse when you move your neck.  Loss of feeling or tingling in your arm or hand.  Weakness in your arm or hand, in very bad cases. How is this treated? In many cases, treatment is not needed for this condition. With rest, the condition often gets better over time. If treatment is needed, options may include:  Wearing a soft neck collar (cervical collar) for short periods of time, as told by your doctor.  Doing exercises (physical therapy) to strengthen your neck muscles.  Taking medicines.  Having shots (injections) in your spine, in very bad cases.  Having surgery. This may be needed if other treatments do not help. The type of surgery that is used depends on the cause of your condition. Follow these instructions at home: If you have a soft neck collar:  Wear it as told by your doctor. Remove it only as told by your doctor.  Ask your doctor if you can remove the collar for cleaning and bathing. If you are allowed to remove the collar for cleaning or bathing: ? Follow  instructions from your doctor about how to remove the collar safely. ? Clean the collar by wiping it with mild soap and water and drying it completely. ? Take out any removable pads in the collar every 1-2 days. Wash them by hand with soap and water. Let them air-dry completely before you put them back in the collar. ? Check your skin under the collar for redness or sores. If you see any, tell your doctor. Managing pain      Take over-the-counter and prescription medicines only as told by your doctor.  If told, put ice on the painful area. ? If you have a soft neck collar, remove it as told by your doctor. ? Put ice in a plastic bag. ? Place a towel between your skin and the bag. ? Leave the ice on for 20 minutes, 2-3 times a day.  If using ice does not help, you can try using heat. Use the heat source that your doctor recommends, such as a moist heat pack or a heating pad. ? Place a towel between your skin and the heat source. ? Leave the heat on for 20-30 minutes. ? Remove the heat if your skin turns bright red. This is very important if you are unable to feel pain, heat, or cold. You may have a greater risk of getting burned.  You may try a gentle neck and shoulder rub (massage). Activity  Rest as needed.  Return to your normal activities as told by your doctor. Ask your doctor what activities are safe for you.  Do exercises as told by your doctor or physical therapist.  Do not lift anything that is heavier than 10 lb (4.5 kg) until your doctor tells you that it is safe. General instructions  Use a flat pillow when you sleep.  Do not drive while wearing a soft neck collar. If you do not have a soft neck collar, ask your doctor if it is safe to drive while your neck heals.  Ask your doctor if the medicine prescribed to you requires you to avoid driving or using heavy machinery.  Do not use any products that contain nicotine or tobacco, such as cigarettes, e-cigarettes, and  chewing tobacco. These can delay healing. If you need help quitting, ask your doctor.  Keep all follow-up visits as told by your doctor. This is important. Contact a doctor if:  Your condition does not get better with treatment. Get help right away if:  Your pain gets worse and is not helped with medicine.  You lose feeling or feel weak in your hand, arm, face, or leg.  You have a high fever.  You have a stiff neck.  You cannot control when you poop or pee (have incontinence).  You have trouble with walking, balance, or talking. Summary  Cervical radiculopathy means that a nerve in the neck is pinched or bruised.  A nerve can get pinched from a bulging disk, arthritis, an injury to the neck, or other causes.  Symptoms include pain, tingling, or loss of feeling that goes from the neck into the arm or hand.  Weakness in your arm or hand can happen in very bad cases.  Treatment may include resting, wearing a soft neck collar, and doing exercises. You might need to take medicines for pain. In very bad cases, shots or surgery may be needed. This information is not intended to replace advice given to you by your health care provider. Make sure you discuss any questions you have with your health care provider. Document Revised: 05/22/2018 Document Reviewed: 05/22/2018 Elsevier Patient Education  [place].   Seborrheic Keratosis A seborrheic keratosis is a common, noncancerous (benign) skin growth. These growths are velvety, waxy, rough, tan, brown, or black spots that appear on the skin. These skin growths can be flat or raised, and scaly. What are the causes? The cause of this condition is not known. What increases the risk? You are more likely to develop this condition if you:  Have a family history of seborrheic keratosis.  Are 50 or older.  Are pregnant.  Have had estrogen replacement therapy. What are the signs or symptoms? Symptoms of this condition include  growths on the face, chest, shoulders, back, or other areas. These growths:  Are usually painless, but may become irritated and itchy.  Can be yellow, brown, black, or other colors.  Are slightly raised or have a flat surface.  Are sometimes rough or wart-like in texture.  Are often velvety or waxy on the surface.  Are round or oval-shaped.  Often occur in groups, but may occur as a single growth. How is this diagnosed? This condition is diagnosed with a medical history and physical exam.  A sample of the growth may be tested (skin biopsy).  You may need to see a skin specialist (dermatologist). How is this treated? Treatment is not usually needed for this condition, unless the growths  are irritated or bleed often.  You may also choose to have the growths removed if you do not like their appearance. ? Most commonly, these growths are treated with a procedure in which liquid nitrogen is applied to "freeze" off the growth (cryosurgery). ? They may also be burned off with electricity (electrocautery) or removed by scraping (curettage). Follow these instructions at home:  Watch your growth for any changes.  Keep all follow-up visits as told by your health care provider. This is important.  Do not scratch or pick at the growth or growths. This can cause them to become irritated or infected. Contact a health care provider if:  You suddenly have many new growths.  Your growth bleeds, itches, or hurts.  Your growth suddenly becomes larger or changes color. Summary  A seborrheic keratosis is a common, noncancerous (benign) skin growth.  Treatment is not usually needed for this condition, unless the growths are irritated or bleed often.  Watch your growth for any changes.  Contact a health care provider if you suddenly have many new growths or your growth suddenly becomes larger or changes color.  Keep all follow-up visits as told by your health care provider. This is  important. This information is not intended to replace advice given to you by your health care provider. Make sure you discuss any questions you have with your health care provider. Document Revised: 11/13/2017 Document Reviewed: 11/13/2017 Elsevier Patient Education  [place].

## 2020-02-20 ENCOUNTER — Encounter: Payer: Self-pay | Admitting: Physician Assistant

## 2020-02-21 DIAGNOSIS — L821 Other seborrheic keratosis: Secondary | ICD-10-CM | POA: Insufficient documentation

## 2020-02-22 DIAGNOSIS — M5030 Other cervical disc degeneration, unspecified cervical region: Secondary | ICD-10-CM | POA: Diagnosis not present

## 2020-02-22 DIAGNOSIS — M5412 Radiculopathy, cervical region: Secondary | ICD-10-CM | POA: Diagnosis not present

## 2020-03-02 ENCOUNTER — Telehealth: Payer: Self-pay

## 2020-03-02 DIAGNOSIS — M5412 Radiculopathy, cervical region: Secondary | ICD-10-CM

## 2020-03-02 DIAGNOSIS — M5030 Other cervical disc degeneration, unspecified cervical region: Secondary | ICD-10-CM

## 2020-03-02 NOTE — Telephone Encounter (Signed)
Patient left msg requesting to move forward with MRI on neck   Please advise

## 2020-03-06 NOTE — Telephone Encounter (Signed)
Ordered placed

## 2020-03-07 NOTE — Telephone Encounter (Signed)
Being contacted by imaging to schedule

## 2020-03-08 ENCOUNTER — Telehealth: Payer: Self-pay

## 2020-03-08 MED ORDER — MELOXICAM 15 MG PO TABS
15.0000 mg | ORAL_TABLET | Freq: Every day | ORAL | 1 refills | Status: DC
Start: 2020-03-08 — End: 2020-05-01

## 2020-03-08 MED ORDER — CYCLOBENZAPRINE HCL 10 MG PO TABS
10.0000 mg | ORAL_TABLET | Freq: Three times a day (TID) | ORAL | 0 refills | Status: DC | PRN
Start: 2020-03-08 — End: 2020-12-29

## 2020-03-08 NOTE — Telephone Encounter (Signed)
Pt left a vm msg requesting if provider can send in a muscle relaxant rx for his back. Pls send to [facility] if appropriate. Thanks.

## 2020-03-08 NOTE — Telephone Encounter (Signed)
I sent mobic, an anti-inflammatory to take daily and flexeril a muscle relaxer to take as needed.

## 2020-03-08 NOTE — Telephone Encounter (Signed)
Task completed. Pt has been updated regarding meds sent to the pharmacy. No other inquiries during the call.

## 2020-03-14 ENCOUNTER — Other Ambulatory Visit: Payer: Self-pay

## 2020-03-14 ENCOUNTER — Encounter: Payer: Self-pay | Admitting: Physician Assistant

## 2020-03-14 ENCOUNTER — Ambulatory Visit (INDEPENDENT_AMBULATORY_CARE_PROVIDER_SITE_OTHER): Payer: Medicare Other

## 2020-03-14 DIAGNOSIS — G9519 Other vascular myelopathies: Secondary | ICD-10-CM | POA: Diagnosis not present

## 2020-03-14 DIAGNOSIS — M4802 Spinal stenosis, cervical region: Secondary | ICD-10-CM | POA: Diagnosis not present

## 2020-03-14 DIAGNOSIS — M5030 Other cervical disc degeneration, unspecified cervical region: Secondary | ICD-10-CM | POA: Diagnosis not present

## 2020-03-14 DIAGNOSIS — M2578 Osteophyte, vertebrae: Secondary | ICD-10-CM | POA: Diagnosis not present

## 2020-03-14 DIAGNOSIS — R413 Other amnesia: Secondary | ICD-10-CM | POA: Diagnosis not present

## 2020-03-14 DIAGNOSIS — E782 Mixed hyperlipidemia: Secondary | ICD-10-CM | POA: Diagnosis not present

## 2020-03-14 DIAGNOSIS — M47812 Spondylosis without myelopathy or radiculopathy, cervical region: Secondary | ICD-10-CM | POA: Diagnosis not present

## 2020-03-14 DIAGNOSIS — M5412 Radiculopathy, cervical region: Secondary | ICD-10-CM

## 2020-03-14 NOTE — Telephone Encounter (Signed)
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 Your have foraminal narrowing on left and right. You need to see orthopedic specialist to vision plan for the future and consider infections. Are you ok with referral and do you have anyone in mind.

## 2020-03-15 LAB — SEDIMENTATION RATE: Sed Rate: 2 mm/h (ref 0–20)

## 2020-03-15 LAB — COMPLETE METABOLIC PANEL WITH GFR
AG Ratio: 2 (calc) (ref 1.0–2.5)
ALT: 28 U/L (ref 9–46)
AST: 22 U/L (ref 10–35)
Albumin: 4.2 g/dL (ref 3.6–5.1)
Alkaline phosphatase (APISO): 62 U/L (ref 35–144)
BUN: 19 mg/dL (ref 7–25)
CO2: 25 mmol/L (ref 20–32)
Calcium: 9.3 mg/dL (ref 8.6–10.3)
Chloride: 105 mmol/L (ref 98–110)
Creat: 1 mg/dL (ref 0.70–1.25)
GFR, Est African American: 90 mL/min/{1.73_m2} (ref >=60)
GFR, Est Non African American: 78 mL/min/{1.73_m2} (ref >=60)
Globulin: 2.1 g/dL (calc) (ref 1.9–3.7)
Glucose, Bld: 83 mg/dL (ref 65–99)
Potassium: 4.7 mmol/L (ref 3.5–5.3)
Sodium: 141 mmol/L (ref 135–146)
Total Bilirubin: 0.4 mg/dL (ref 0.2–1.2)
Total Protein: 6.3 g/dL (ref 6.1–8.1)

## 2020-03-15 LAB — CBC WITH DIFFERENTIAL/PLATELET
Absolute Monocytes: 380 cells/uL (ref 200–950)
Basophils Absolute: 20 cells/uL (ref 0–200)
Basophils Relative: 0.5 %
Eosinophils Absolute: 112 cells/uL (ref 15–500)
Eosinophils Relative: 2.8 %
HCT: 45.9 % (ref 38.5–50.0)
Hemoglobin: 15.7 g/dL (ref 13.2–17.1)
Lymphs Abs: 1172 cells/uL (ref 850–3900)
MCH: 29.9 pg (ref 27.0–33.0)
MCHC: 34.2 g/dL (ref 32.0–36.0)
MCV: 87.4 fL (ref 80.0–100.0)
MPV: 10.5 fL (ref 7.5–12.5)
Monocytes Relative: 9.5 %
Neutro Abs: 2316 cells/uL (ref 1500–7800)
Neutrophils Relative %: 57.9 %
Platelets: 197 10*3/uL (ref 140–400)
RBC: 5.25 10*6/uL (ref 4.20–5.80)
RDW: 13 % (ref 11.0–15.0)
Total Lymphocyte: 29.3 %
WBC: 4 10*3/uL (ref 3.8–10.8)

## 2020-03-15 LAB — LIPID PANEL W/REFLEX DIRECT LDL
Cholesterol: 236 mg/dL — ABNORMAL HIGH (ref <200)
HDL: 51 mg/dL (ref >=40)
LDL Cholesterol (Calc): 157 mg/dL (calc) — ABNORMAL HIGH
Non-HDL Cholesterol (Calc): 185 mg/dL (calc) — ABNORMAL HIGH (ref <130)
Total CHOL/HDL Ratio: 4.6 (calc) (ref <5.0)
Triglycerides: 151 mg/dL — ABNORMAL HIGH (ref <150)

## 2020-03-15 LAB — B12 AND FOLATE PANEL
Folate: 19.8 ng/mL
Vitamin B-12: 566 pg/mL (ref 200–1100)

## 2020-03-15 LAB — TSH: TSH: 4.52 mIU/L — ABNORMAL HIGH (ref 0.40–4.50)

## 2020-03-15 NOTE — Progress Notes (Signed)
Marc Lowe,   B12 perfect. No anemia. Thyroid still just barely in that hypo thyroid range. We could consider supplementation to get you to the 1-2 level if you felt like you were having hypothyroid symptoms.   Kidney, liver, glucose look great.   Your cholesterol is pretty stable from a year ago except your TG have gone up.  Your new 10 year CV risk is 13.8 percent up from last year 11.6 percent.  Anything above 7.5 percent recommended a statin lowering drug. I would also recommend daily ASA 81mg .

## 2020-03-15 NOTE — Telephone Encounter (Signed)
Pt agrees with provider's recommendation regarding a referral to Orthopedics. Referral pended for provider's review.

## 2020-03-16 ENCOUNTER — Other Ambulatory Visit: Payer: Self-pay | Admitting: Physician Assistant

## 2020-03-16 DIAGNOSIS — E039 Hypothyroidism, unspecified: Secondary | ICD-10-CM

## 2020-03-16 MED ORDER — EZETIMIBE 10 MG PO TABS
10.0000 mg | ORAL_TABLET | Freq: Every day | ORAL | 3 refills | Status: DC
Start: 2020-03-16 — End: 2021-02-07

## 2020-03-16 MED ORDER — LEVOTHYROXINE SODIUM 25 MCG PO TABS: 25.0000 ug | ORAL_TABLET | Freq: Every day | ORAL | 1 refills | Status: DC

## 2020-03-16 NOTE — Progress Notes (Signed)
Zetia is not a statin but pt will do zetia. Sent.

## 2020-03-16 NOTE — Progress Notes (Signed)
Patient aware. TSH ordered to have drawn in 6 weeks.

## 2020-03-16 NOTE — Addendum Note (Signed)
Addended byAnnamaria Helling on: 03/16/2020 12:37 PM   Modules accepted: Orders

## 2020-03-16 NOTE — Progress Notes (Signed)
I sent 88mcg recheck TSH in 6 weeks.

## 2020-03-16 NOTE — Progress Notes (Signed)
Patient called back and left a vm stating his wife wants him to start thyroid medication. [facility].

## 2020-04-04 ENCOUNTER — Ambulatory Visit (INDEPENDENT_AMBULATORY_CARE_PROVIDER_SITE_OTHER): Payer: Medicare Other | Admitting: Orthopaedic Surgery

## 2020-04-04 ENCOUNTER — Ambulatory Visit: Payer: Self-pay

## 2020-04-04 ENCOUNTER — Encounter: Payer: Self-pay | Admitting: Orthopaedic Surgery

## 2020-04-04 VITALS — Ht 74.0 in | Wt 225.0 lb

## 2020-04-04 DIAGNOSIS — G542 Cervical root disorders, not elsewhere classified: Secondary | ICD-10-CM

## 2020-04-04 DIAGNOSIS — M4802 Spinal stenosis, cervical region: Secondary | ICD-10-CM

## 2020-04-04 DIAGNOSIS — M5412 Radiculopathy, cervical region: Secondary | ICD-10-CM | POA: Diagnosis not present

## 2020-04-04 DIAGNOSIS — M542 Cervicalgia: Secondary | ICD-10-CM | POA: Diagnosis not present

## 2020-04-04 MED ORDER — PREDNISONE 10 MG (21) PO TBPK: ORAL_TABLET | Freq: Every day | ORAL | 0 refills | Status: DC

## 2020-04-04 NOTE — Progress Notes (Signed)
Office Visit Note   Patient: Marc Lowe           Date of Birth: 09-08-1950           MRN: [id number] Visit Date: 04/04/2020              Requested by: Donella Stade, PA-C [address] [place],  [place] [address] PCP: Donella Stade, PA-C   Assessment & Plan: Visit Diagnoses:  1. Neck pain   2. Cervical nerve root impingement   3. Cervical radiculopathy   4. Foraminal stenosis of cervical region     Plan: Patient is not having significant pain he does have some atrophy but still has good strength he is doing normal activities.  If he gets progressive symptoms he can return.  MRI scan was reviewed with patient and his wife treatment options discussed including possible cervical fusion if he becomes more symptomatic.  Currently he is happy to proceed with nonoperative observation.  Follow-Up Instructions: Return in about 2 months (around 06/04/2020).   Orders:  Orders Placed This Encounter  Procedures  . XR Cervical Spine 2 or 3 views   Meds ordered this encounter  Medications  . predniSONE (STERAPRED UNI-PAK 21 TAB) 10 MG (21) TBPK tablet    Sig: Take by mouth daily. Take as instructed with food. 6,5,4,3,2,1 one tablet less each day .    Dispense:  21 tablet    Refill:  0      Procedures: No procedures performed   Clinical Data: No additional findings.   Subjective: Chief Complaint  Patient presents with  . Neck - Pain    HPI 69 year old male referred for right biceps atrophy with right arm numbness.  He has gone to physical therapy also chiropractic treatment without relief.  MRI scan has been obtained and is available for review.  Not really having pain in his arm but just noticed some progressive atrophy over the last 2 months.  MRI scan 03/14/2020 shows mild central stenosis at C4-5 with moderate to severe foraminal stenosis worse on the left opposite from his symptoms.  C5-6 shows moderate to moderate severe foraminal narrowing  worse on the right likely symptomatic level.  C6-7 shows moderate to severe bilateral foraminal narrowing worse on the left than right.  Review of Systems all other systems are noncontributory to HPI.   Objective: Vital Signs: Ht 6\' 2"  (1.88 m)   Wt 225 lb (102.1 kg)   BMI 28.89 kg/m   Physical Exam Constitutional:      Appearance: He is well-developed.  HENT:     Head: Normocephalic and atraumatic.  Eyes:     Pupils: Pupils are equal, round, and reactive to light.  Neck:     Thyroid: No thyromegaly.     Trachea: No tracheal deviation.  Cardiovascular:     Rate and Rhythm: Normal rate.  Pulmonary:     Effort: Pulmonary effort is normal.     Breath sounds: No wheezing.  Abdominal:     General: Bowel sounds are normal.     Palpations: Abdomen is soft.  Skin:    General: Skin is warm and dry.     Capillary Refill: Capillary refill takes less than 2 seconds.  Neurological:     Mental Status: He is alert and oriented to person, place, and time.  Psychiatric:        Behavior: Behavior normal.        Thought Content: Thought content  normal.        Judgment: Judgment normal.     Ortho Exam, patient has brachial plexus tenderness on the right some discomfort positive Spurling.  Patient has good biceps triceps strength.  Mild right biceps atrophy.  Rotator cuff exam is normal right and left. Specialty Comments:  No specialty comments available.  Imaging: CLINICAL DATA:  Right arm tingling for 2 months.  EXAM: MRI CERVICAL SPINE WITHOUT CONTRAST  TECHNIQUE: Multiplanar, multisequence MR imaging of the cervical spine was performed. No intravenous contrast was administered.  COMPARISON:  None.  FINDINGS: Alignment: Maintained.  Vertebrae: No fracture, evidence of discitis, or bone lesion. Scattered degenerative endplate signal change appears worst at C6-7.  Cord: Normal signal throughout.  Posterior Fossa, vertebral arteries, paraspinal tissues:  Negative.  Disc levels:  C2-3: Minimal bulge without stenosis.  C3-4: There is loss of disc space height. Disc and endplate spur eccentric to the right and right worse than left uncovertebral disease. Mild foraminal narrowing is more notable on the right. The central canal is open.  C4-5: Bilateral facet degenerative disease is worse on the right. There is a disc osteophyte complex and uncovertebral spurring. Mild central canal stenosis. Moderate to moderately severe foraminal narrowing is worse on the left.  C5-6: Right worse than left facet degenerative change. There is minimal marrow edema in the right facets. Disc osteophyte complex and right worse than left uncovertebral spurring. The ventral thecal sac is effaced. Moderate to moderately severe foraminal narrowing is worse on the right.  C6-7: Disc osteophyte complex and uncovertebral spurring facet degenerative change. The ventral thecal sac is effaced. Moderately severe to severe bilateral foraminal narrowing is worse on the left.  C7-T1: Bilateral facet degenerative change.  No stenosis.  IMPRESSION: Mild central canal stenosis at C4-5 where moderate to moderately severe foraminal narrowing is worse on the left.  The ventral thecal sac is effaced at C5-6 where moderate to moderately severe foraminal narrowing is worse on the right. Mild marrow edema in the right facets at this level noted.  The ventral thecal sac is effaced at C6-7 where moderately severe to severe bilateral foraminal narrowing is worse on the left.   Electronically Signed   By: Inge Rise M.D.   On: 03/14/2020 14:06   PMFS History: Patient Active Problem List   Diagnosis Date Noted  . 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 Seborrheic keratoses 02/21/2020  . Cervical radiculopathy 02/18/2020  . DDD (degenerative disc disease), cervical 02/18/2020  . Mixed hyperlipidemia 02/18/2020  . Memory changes 02/18/2020   . Anhedonia 05/13/2017  . Abnormal weight gain 05/13/2017  . Elevated TSH 12/18/2016  . No energy 12/17/2016  . Toenail fungus 12/16/2016  . Left shoulder pain 11/21/2016  . 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 Family history of atrial fibrillation 06/11/2016  . LLQ pain 05/08/2016  . History of diverticulitis 05/08/2016  . 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 Hyperlipidemia 04/15/2014  . History of bowel resection 04/14/2014  . Testosterone deficiency 04/14/2014  . DIARRHEA 03/14/2010  . ABDOMINAL PAIN, GENERALIZED 03/14/2010  . VARICES OF OTHER SITES 10/04/2009  . IMPAIRED FASTING GLUCOSE 07/24/2009  . PARONYCHIA, FINGER 07/13/2009  . SKIN RASH 07/13/2009  . ELEVATED PROSTATE SPECIFIC ANTIGEN 04/10/2009  . CELLULITIS AND ABSCESS OF OTHER SPECIFIED SITE 05/09/2008  . CHRONIC RHINITIS 06/04/2007  . 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 HYPOGONADISM, MALE 11/04/2006  . Depression, recurrent ([facility]) 11/04/2006  . ACNE ROSACEA 10/07/2006  . LIBIDO, DECREASED 10/07/2006  Past Medical History:  Diagnosis Date  . Diverticulitis     Family History  Problem Relation Age of Onset  . Atrial fibrillation Father     No past surgical history on file. Social History   Occupational History  . Occupation: Chief Strategy Officer    Comment: retired  Tobacco Use  . Smoking status: Never Smoker  . Smokeless tobacco: Never Used  Vaping Use  . Vaping Use: Never used  Substance and Sexual Activity  . Alcohol use: Yes    Alcohol/week: 1.0 standard drink    Types: 1 Glasses of wine per week    Comment: 4 ounces every couple of days  . Drug use: No  . Sexual activity: Yes    Partners: Female

## 2020-04-07 DIAGNOSIS — M4802 Spinal stenosis, cervical region: Secondary | ICD-10-CM | POA: Insufficient documentation

## 2020-04-30 ENCOUNTER — Other Ambulatory Visit: Payer: Self-pay | Admitting: Physician Assistant

## 2020-05-06 ENCOUNTER — Other Ambulatory Visit: Payer: Self-pay | Admitting: Physician Assistant

## 2020-05-16 DIAGNOSIS — K621 Rectal polyp: Secondary | ICD-10-CM | POA: Diagnosis not present

## 2020-05-22 DIAGNOSIS — Z86010 Personal history of colonic polyps: Secondary | ICD-10-CM | POA: Diagnosis not present

## 2020-05-22 DIAGNOSIS — K649 Unspecified hemorrhoids: Secondary | ICD-10-CM | POA: Diagnosis not present

## 2020-05-22 DIAGNOSIS — K621 Rectal polyp: Secondary | ICD-10-CM | POA: Diagnosis not present

## 2020-05-22 DIAGNOSIS — K6289 Other specified diseases of anus and rectum: Secondary | ICD-10-CM | POA: Diagnosis not present

## 2020-05-29 ENCOUNTER — Ambulatory Visit: Payer: Medicare Other

## 2020-05-29 ENCOUNTER — Encounter: Payer: Self-pay | Admitting: Nurse Practitioner

## 2020-05-29 ENCOUNTER — Ambulatory Visit (INDEPENDENT_AMBULATORY_CARE_PROVIDER_SITE_OTHER): Payer: Medicare Other | Admitting: Nurse Practitioner

## 2020-05-29 VITALS — BP 134/86 | HR 62 | Temp 98.1°F | Resp 12 | Ht 74.0 in | Wt 226.9 lb

## 2020-05-29 DIAGNOSIS — Z23 Encounter for immunization: Secondary | ICD-10-CM | POA: Insufficient documentation

## 2020-05-29 DIAGNOSIS — R972 Elevated prostate specific antigen [PSA]: Secondary | ICD-10-CM | POA: Diagnosis not present

## 2020-05-29 DIAGNOSIS — F339 Major depressive disorder, recurrent, unspecified: Secondary | ICD-10-CM | POA: Diagnosis not present

## 2020-05-29 DIAGNOSIS — Z0000 Encounter for general adult medical examination without abnormal findings: Secondary | ICD-10-CM | POA: Diagnosis not present

## 2020-05-29 DIAGNOSIS — E782 Mixed hyperlipidemia: Secondary | ICD-10-CM | POA: Diagnosis not present

## 2020-05-29 DIAGNOSIS — E039 Hypothyroidism, unspecified: Secondary | ICD-10-CM

## 2020-05-29 DIAGNOSIS — R7989 Other specified abnormal findings of blood chemistry: Secondary | ICD-10-CM

## 2020-05-29 DIAGNOSIS — R7301 Impaired fasting glucose: Secondary | ICD-10-CM | POA: Diagnosis not present

## 2020-05-29 MED ORDER — ZOSTER VAC RECOMB ADJUVANTED 50 MCG/0.5ML IM SUSR: 0.5000 mL | Freq: Once | INTRAMUSCULAR | 1 refills | Status: AC

## 2020-05-29 MED ORDER — VIIBRYD 20 MG PO TABS: 20.0000 mg | ORAL_TABLET | Freq: Every day | ORAL | 3 refills | Status: DC

## 2020-05-29 NOTE — Progress Notes (Signed)
Subjective:   CAROLYN MANISCALCO is a 69 y.o. male who presents for Medicare Annual/Subsequent preventive examination.  Method of visit: In person  Review of Systems:  Neuro: Denies difficulty remembering daily tasks, people, or places.  Ear: Denies difficulty hearing or need to increase volume on television or telephone to hear Eye: Denies visual changes, difficulty reading normal print, or visual field deficits. Cardiac: Denies chest pain, palpitations, dizziness, shortness of breath, pain in lower extremities, or night time waking with shortness of breath. Lung: Denies shortness of breath, difficulty breathing, chronic cough, or dizziness.  GI: Denies changes in bowel habits, blood in stool, difficulty passing stool, decreased intake of food or drink, nausea, or vomiting.  GU: Denies changes in urinary habits, dark urine, malodorous urine, increased or decreased urination, or urinary incontinence.  MSK: Denies weakness in extremities, difficulty walking, difficulty grasping, or new MSK pain.  Skin: Denies changes to the skin, fragile skin, or increased bruising.  Constitution: Denies fatigue, weakness, or confusion.     Cardiac Risk Factors include: advanced age (>31men, >56 women);dyslipidemia;male gender     Objective:    Vitals: BP 134/86 (BP Location: Left Arm, Patient Position: Sitting, Cuff Size: Normal)   Pulse 62   Temp 98.1 F (36.7 C) (Oral)   Resp 12   Ht 6\' 2"  (1.88 m)   Wt 226 lb 14.4 oz (102.9 kg)   SpO2 97%   BMI 29.13 kg/m   Body mass index is 29.13 kg/m.  Advanced Directives 05/29/2020 05/25/2019 05/04/2015  Does Patient Have a Medical Advance Directive? Yes Yes Yes  Type of Paramedic of [place];Living will [place];Living will -  Does patient want to make changes to medical advance directive? No - Patient declined No - Patient declined -  Copy of [place] in Chart? No - copy requested No -  copy requested No - copy requested    Tobacco Social History   Tobacco Use  Smoking Status Never Smoker  Smokeless Tobacco Never Used     Counseling given: No   Clinical Intake:  Pre-visit preparation completed: Yes  Pain : No/denies pain Pain Score: 0-No pain     BMI - recorded: 29.13 Nutritional Status: BMI 25 -29 Overweight Nutritional Risks: None Diabetes: No  How often do you need to have someone help you when you read instructions, pamphlets, or other written materials from your doctor or pharmacy?: 1 - Never What is the last grade level you completed in school?: 12  Interpreter Needed?: No  Information entered by :: S. Charnika Herbst  Past Medical History:  Diagnosis Date  . Diverticulitis    History reviewed. No pertinent surgical history. Family History  Problem Relation Age of Onset  . Atrial fibrillation Father    Social History   Socioeconomic History  . Marital status: Married    Spouse name: Tillman Abide  . Number of children: 4  . Years of education: 15  . Highest education level: Bachelor's degree (e.g., BA, AB, BS)  Occupational History  . Occupation: Chief Strategy Officer    Comment: retired  Tobacco Use  . Smoking status: Never Smoker  . Smokeless tobacco: Never Used  Vaping Use  . Vaping Use: Never used  Substance and Sexual Activity  . Alcohol use: Yes    Alcohol/week: 3.0 standard drinks    Types: 3 Glasses of wine per week    Comment: 4 ounces every couple of days  . Drug use: No  .  Sexual activity: Yes    Partners: Female  Other Topics Concern  . Not on file  Social History Narrative   Plays golf once a week. 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 Difficulty of Paying Living Expenses: Not hard at all  Food Insecurity: No Food Insecurity  . Worried About Charity fundraiser in the Last Year: Never true  . Ran Out of Food in the Last Year: Never true    Transportation Needs: No Transportation Needs  . Lack of Transportation (Medical): No  . Lack of Transportation (Non-Medical): No  Physical Activity: Sufficiently Active  . Days of Exercise per Week: 5 days  . Minutes of Exercise per Session: 100 min  Stress: No Stress Concern Present  . Feeling of Stress : Not at all  Social Connections: Moderately Integrated  . Frequency of Communication with Friends and Family: More than three times a week  . Frequency of Social Gatherings with Friends and Family: More than three times a week  . Attends Religious Services: Never  . Active Member of Clubs or Organizations: Yes  . Attends Archivist Meetings: More than 4 times per year  . Marital Status: Married    Outpatient Encounter Medications as of 05/29/2020  Medication Sig  . ascorbic acid (VITAMIN C) 500 MG tablet Take 500 mg by mouth daily.  [name] Kitchen aspirin 81 MG EC tablet Take 81 mg by mouth daily.  . B COMPLEX-C ER PO Take 1 tablet by mouth daily.  . Cholecalciferol 25 MCG (1000 UT) capsule Take 1 tablet by mouth daily.  . cyclobenzaprine (FLEXERIL) 10 MG tablet Take 1 tablet (10 mg total) by mouth 3 (three) times daily as needed for muscle spasms.  . diclofenac sodium (VOLTAREN) 1 % GEL Apply 4 g topically 4 (four) times daily. To affected joint.  [name] Kitchen ezetimibe (ZETIA) 10 MG tablet Take 1 tablet (10 mg total) by mouth daily.  [name] Kitchen levothyroxine (SYNTHROID) 25 MCG tablet Take 1 tablet (25 mcg total) by mouth daily before breakfast. Labs for refills  . magnesium gluconate (MAGONATE) 500 MG tablet Take 500 mg by mouth daily.  . Sildenafil Citrate (VIAGRA PO) Take by mouth.  . Turmeric 500 MG CAPS Take 1 capsule by mouth daily.  . Zinc Sulfate (ZINC 15 PO) Take 50 mg by mouth every morning.  . [DISCONTINUED] Vilazodone HCl (VIIBRYD) 20 MG TABS Take 1 tablet (20 mg total) by mouth daily. Patient is due for follow-up appointment with PCP for future refills.  . Vilazodone HCl (VIIBRYD) 20 MG  TABS Take 1 tablet (20 mg total) by mouth daily.  [name] Kitchen Zoster Vaccine Adjuvanted [facility]) injection Inject 0.5 mLs into the muscle once for 1 dose. Repeat in 2-6 months. Please fax confirmation of vaccination to [facility], DNP  [phone]  . [DISCONTINUED] meloxicam (MOBIC) 15 MG tablet Take 1 tablet (15 mg total) by mouth daily. Due for appt  . [DISCONTINUED] predniSONE (DELTASONE) 50 MG tablet One tab PO daily for 5 days. (Patient not taking: Reported on 04/04/2020)  . [DISCONTINUED] predniSONE (STERAPRED UNI-PAK 21 TAB) 10 MG (21) TBPK tablet Take by mouth daily. Take as instructed with food. 6,5,4,3,2,1 one tablet less each day .   No facility-administered encounter medications on file as of 05/29/2020.    Activities of Daily Living In your present state of health, do you have any difficulty performing the following activities: 05/29/2020  Hearing? N  Vision? N  Difficulty concentrating or making decisions? N  Walking or climbing stairs? N  Dressing or bathing? N  Doing errands, shopping? N  Preparing Food and eating ? N  Using the Toilet? N  In the past six months, have you accidently leaked urine? N  Do you have problems with loss of bowel control? N  Managing your Medications? N  Managing your Finances? N  Housekeeping or managing your Housekeeping? N  Some recent data might be hidden    Patient Care Team: Lavada Mesi as PCP - General (Family Medicine)   Assessment:   This is a routine wellness examination for Jamell.  Exercise Activities and Dietary recommendations Current Exercise Habits: Home exercise routine, Type of exercise: walking, Time (Minutes): > 60, Frequency (Times/Week): 5, Weekly Exercise (Minutes/Week): 0, Intensity: Moderate, Exercise limited by: None identified  Goals    .  DIET - INCREASE WATER INTAKE      Drink more water daily    .  healthy nutrition (pt-stated)      Improve overall health to get off of medications.        Fall  Risk Fall Risk  05/29/2020 05/25/2019  Falls in the past year? 0 0  Number falls in past yr: 0 0  Injury with Fall? 0 0  Risk for fall due to : No Fall Risks -  Follow up Falls prevention discussed Falls prevention discussed   Is the patient's home free of loose throw rugs in walkways, pet beds, electrical cords, etc?   yes      Grab bars in the bathroom? yes      Handrails on the stairs?   yes      Adequate lighting?   yes  Timed Get Up and Go Performed: yes  Patient rating of health (0-10): 9  Depression Screen PHQ 2/9 Scores 05/29/2020 05/25/2019 05/06/2018 05/13/2017  PHQ - 2 Score 0 0 1 3  PHQ- 9 Score - - 1 3  Exception Documentation - Medical reason - -    Cognitive Function MMSE - Mini Mental State Exam 02/18/2020  Orientation to time 4  Orientation to Place 5  Registration 3  Attention/ Calculation 5  Recall 3  Language- name 2 objects 2  Language- repeat 1  Language- follow 3 step command 3  Language- read & follow direction 1  Write a sentence 1  Copy design 1  Total score 29     6CIT Screen 05/29/2020 05/25/2019  What Year? 0 points 0 points  What month? 0 points 0 points  What time? 0 points 0 points  Count back from 20 0 points 0 points  Months in reverse 0 points 0 points  Repeat phrase 0 points 0 points  Total Score 0 0    Immunization History  Administered Date(s) Administered  . PFIZER SARS-COV-2 Vaccination 09/30/2019, 10/21/2019  . Pneumococcal Polysaccharide-23 05/06/2018  . Tdap 04/14/2014  . Zoster 04/14/2014    Screening Tests Health Maintenance  Topic Date Due  . PNA vac Low Risk Adult (2 of 2 - PCV13) 02/17/2021 (Originally 05/07/2019)  . INFLUENZA VACCINE  03/15/2021 (Originally 02/13/2020)  . TETANUS/TDAP  04/14/2024  . COLONOSCOPY  10/06/2029  . COVID-19 Vaccine  Completed  . Hepatitis C Screening  Completed   Cancer Screenings: Lung: Low Dose CT Chest recommended if Age 54-80 years, 30 pack-year currently smoking OR have  quit w/in 15years. Patient does not qualify. Colorectal: Up to date 09/2019  Additional Screenings: none       Plan:  1. Medicare annual wellness visit, subsequent Annual medicare wellness exam with no abnormalities noted.  Will obtain labs today. Recommendations for vaccinations, healthy lifestyle, and safety provided.  Patient declined flu vaccine and COVID booster.  Follow-up in 1 year - Zoster Vaccine Adjuvanted [facility]) injection; Inject 0.5 mLs into the muscle once for 1 dose. Repeat in 2-6 months. Please fax confirmation of vaccination to Worthy Keeler, DNP  [phone]  Dispense: 0.5 mL; Refill: 1  2. Impaired fasting glucose History of IFG- will repeat labs today - Lipid panel - COMPLETE METABOLIC PANEL WITH GFR  3. ELEVATED PROSTATE SPECIFIC ANTIGEN History of elevated PSA- will obtain labs today Has appointment with urology tomorrow.  Pt will provide lab results to MD.  - PSA  4. Mixed hyperlipidemia Will obtain labs today. Recommendations for healthy diet provided.  - Lipid panel - COMPLETE METABOLIC PANEL WITH GFR  5. Elevated TSH 8. Hypothyroidism, unspecified type Recent start of levothyroxine. Will repeat labs today.  Changes to the plan of care will be addressed once lab results received- will notify PCP.  - TSH  6. Need for zoster vaccination Written prescription provided.  - Zoster Vaccine Adjuvanted [facility]) injection; Inject 0.5 mLs into the muscle once for 1 dose. Repeat in 2-6 months. Please fax confirmation of vaccination to Worthy Keeler, DNP  [phone]  Dispense: 0.5 mL; Refill: 1  7. Depression, recurrent ([name]) Doing well on current regimen.  PHQ 0 GAD 0 Refills provided today - Vilazodone HCl (VIIBRYD) 20 MG TABS; Take 1 tablet (20 mg total) by mouth daily.  Dispense: 90 tablet; Refill: 3   I have personally reviewed and noted the following in the patient's chart:   . Medical and social history . Use of alcohol, tobacco or  illicit drugs  . Current medications and supplements . Functional ability and status . Nutritional status . Physical activity . Advanced directives . List of other physicians . Hospitalizations, surgeries, and ER visits in previous 12 months . Vitals . Screenings to include cognitive, depression, and falls . Referrals and appointments  In addition, I have reviewed and discussed with patient certain preventive protocols, quality metrics, and best practice recommendations. A written personalized care plan for preventive services as well as general preventive health recommendations were provided to patient.    This visit was conducted virtually in the setting of the [place] pandemic.    Orma Render, NP  05/29/2020

## 2020-05-29 NOTE — Patient Instructions (Addendum)
We will obtain labs today for PSA (prostate), Lipids (cholesterol), and CMP (blood sugar, electrolytes, kidney function, and liver function). We will also recheck your TSH (thyroid) today as it appears this was abnormal at last check and needed repeated.    Marc Lowe , Thank you for taking time to come for your Medicare Wellness Visit. I appreciate your ongoing commitment to your health goals. Please review the following plan we discussed and let me know if I can assist you in the future.   These are the goals we discussed: Goals    .  DIET - INCREASE WATER INTAKE      Drink more water daily    .  healthy nutrition (pt-stated)      Improve overall health to get off of medications.        This is a list of the screening recommended for you and due dates:  Health Maintenance  Topic Date Due  . Flu Shot  Never done  . Pneumonia vaccines (2 of 2 - PCV13) 02/17/2021*  . Tetanus Vaccine  04/14/2024  . Colon Cancer Screening  10/06/2029  . COVID-19 Vaccine  Completed  .  Hepatitis C: One time screening is recommended by Center for Disease Control  (CDC) for  adults born from [birth year] through 1964.   Completed  *Topic was postponed. The date shown is not the original due date.     Health Maintenance After Age 63 After age 78, you are at a higher risk for certain long-term diseases and infections as well as injuries from falls. Falls are a major cause of broken bones and head injuries in people who are older than age 32. Getting regular preventive care can help to keep you healthy and well. Preventive care includes getting regular testing and making lifestyle changes as recommended by your health care provider. Talk with your health care provider about:  Which screenings and tests you should have. A screening is a test that checks for a disease when you have no symptoms.  A diet and exercise plan that is right for you. What should I know about screenings and tests to prevent falls? Screening  and testing are the best ways to find a health problem Marc Lowe. Marc Lowe diagnosis and treatment give you the best chance of managing medical conditions that are common after age 59. Certain conditions and lifestyle choices may make you more likely to have a fall. Your health care provider may recommend:  Regular vision checks. Poor vision and conditions such as cataracts can make you more likely to have a fall. If you wear glasses, make sure to get your prescription updated if your vision changes.  Medicine review. Work with your health care provider to regularly review all of the medicines you are taking, including over-the-counter medicines. Ask your health care provider about any side effects that may make you more likely to have a fall. Tell your health care provider if any medicines that you take make you feel dizzy or sleepy.  Osteoporosis screening. Osteoporosis is a condition that causes the bones to get weaker. This can make the bones weak and cause them to break more easily.  Blood pressure screening. Blood pressure changes and medicines to control blood pressure can make you feel dizzy.  Strength and balance checks. Your health care provider may recommend certain tests to check your strength and balance while standing, walking, or changing positions.  Foot health exam. Foot pain and numbness, as well as not wearing proper footwear,  can make you more likely to have a fall.  Depression screening. You may be more likely to have a fall if you have a fear of falling, feel emotionally low, or feel unable to do activities that you used to do.  Alcohol use screening. Using too much alcohol can affect your balance and may make you more likely to have a fall. What actions can I take to lower my risk of falls? General instructions  Talk with your health care provider about your risks for falling. Tell your health care provider if: ? You fall. Be sure to tell your health care provider about all  falls, even ones that seem minor. ? You feel dizzy, sleepy, or off-balance.  Take over-the-counter and prescription medicines only as told by your health care provider. These include any supplements.  Eat a healthy diet and maintain a healthy weight. A healthy diet includes low-fat dairy products, low-fat (lean) meats, and fiber from whole grains, beans, and lots of fruits and vegetables. Home safety  Remove any tripping hazards, such as rugs, cords, and clutter.  Install safety equipment such as grab bars in bathrooms and safety rails on stairs.  Keep rooms and walkways well-lit. Activity   Follow a regular exercise program to stay fit. This will help you maintain your balance. Ask your health care provider what types of exercise are appropriate for you.  If you need a cane or walker, use it as recommended by your health care provider.  Wear supportive shoes that have nonskid soles. Lifestyle  Do not drink alcohol if your health care provider tells you not to drink.  If you drink alcohol, limit how much you have: ? 0-1 drink a day for women. ? 0-2 drinks a day for men.  Be aware of how much alcohol is in your drink. In the [place] one drink equals one typical bottle of beer (12 oz), one-half glass of wine (5 oz), or one shot of hard liquor (1 oz).  Do not use any products that contain nicotine or tobacco, such as cigarettes and e-cigarettes. If you need help quitting, ask your health care provider. Summary  Having a healthy lifestyle and getting preventive care can help to protect your health and wellness after age 64.  Screening and testing are the best way to find a health problem Marc Lowe and help you avoid having a fall. Marc Lowe diagnosis and treatment give you the best chance for managing medical conditions that are more common for people who are older than age 68.  Falls are a major cause of broken bones and head injuries in people who are older than age 12. Take precautions to  prevent a fall at home.  Work with your health care provider to learn what changes you can make to improve your health and wellness and to prevent falls. This information is not intended to replace advice given to you by your health care provider. Make sure you discuss any questions you have with your health care provider. Document Revised: 10/22/2018 Document Reviewed: 05/14/2017 Elsevier Patient Education  2020 [employer].

## 2020-05-30 ENCOUNTER — Other Ambulatory Visit: Payer: Self-pay | Admitting: Nurse Practitioner

## 2020-05-30 DIAGNOSIS — E782 Mixed hyperlipidemia: Secondary | ICD-10-CM

## 2020-05-30 DIAGNOSIS — R972 Elevated prostate specific antigen [PSA]: Secondary | ICD-10-CM | POA: Diagnosis not present

## 2020-05-30 DIAGNOSIS — N5201 Erectile dysfunction due to arterial insufficiency: Secondary | ICD-10-CM | POA: Diagnosis not present

## 2020-05-30 DIAGNOSIS — R3915 Urgency of urination: Secondary | ICD-10-CM | POA: Diagnosis not present

## 2020-05-30 DIAGNOSIS — R7301 Impaired fasting glucose: Secondary | ICD-10-CM

## 2020-05-30 DIAGNOSIS — N401 Enlarged prostate with lower urinary tract symptoms: Secondary | ICD-10-CM | POA: Diagnosis not present

## 2020-05-30 LAB — COMPLETE METABOLIC PANEL WITH GFR
AG Ratio: 1.6 (calc) (ref 1.0–2.5)
ALT: 33 U/L (ref 9–46)
AST: 24 U/L (ref 10–35)
Albumin: 4.1 g/dL (ref 3.6–5.1)
Alkaline phosphatase (APISO): 58 U/L (ref 35–144)
BUN: 15 mg/dL (ref 7–25)
CO2: 28 mmol/L (ref 20–32)
Calcium: 9.4 mg/dL (ref 8.6–10.3)
Chloride: 105 mmol/L (ref 98–110)
Creat: 0.89 mg/dL (ref 0.70–1.25)
GFR, Est African American: 103 mL/min/{1.73_m2} (ref >=60)
GFR, Est Non African American: 88 mL/min/{1.73_m2} (ref >=60)
Globulin: 2.5 g/dL (calc) (ref 1.9–3.7)
Glucose, Bld: 101 mg/dL — ABNORMAL HIGH (ref 65–99)
Potassium: 4.8 mmol/L (ref 3.5–5.3)
Sodium: 140 mmol/L (ref 135–146)
Total Bilirubin: 0.4 mg/dL (ref 0.2–1.2)
Total Protein: 6.6 g/dL (ref 6.1–8.1)

## 2020-05-30 LAB — LIPID PANEL
Cholesterol: 205 mg/dL — ABNORMAL HIGH (ref <200)
HDL: 57 mg/dL (ref >=40)
LDL Cholesterol (Calc): 127 mg/dL (calc) — ABNORMAL HIGH
Non-HDL Cholesterol (Calc): 148 mg/dL (calc) — ABNORMAL HIGH (ref <130)
Total CHOL/HDL Ratio: 3.6 (calc) (ref <5.0)
Triglycerides: 100 mg/dL (ref <150)

## 2020-05-30 LAB — HEMOGLOBIN A1C W/OUT EAG

## 2020-05-30 LAB — TSH: TSH: 4.5 mIU/L (ref 0.40–4.50)

## 2020-05-30 LAB — PSA: PSA: 5.9 ng/mL — ABNORMAL HIGH (ref <=4.0)

## 2020-05-30 NOTE — Progress Notes (Signed)
If patient is feeling great I would leave thyroid medication alone. If he is having any hypothyroid symptoms fatigue/depression etc then I would consider an increase and recheck TsH in 6 weeks.

## 2020-05-30 NOTE — Progress Notes (Signed)
Marc Lowe,   Your labs have come back. Your PSA is elevated, please make sure that you let your urologist know at your appointment today- it is up to 5.90.   Your cholesterol is looking better than it has. Let's plan to focus on a diet low in carbohydrates and saturated fats and we can check this again in 6 months to make sure it is improving. High cholesterol can increase your risk of heart attack and stroke so we want to stay on top of this.   Your TSH is at 4.50, which is the upper limit of normal. I will talk with Marc Lowe and see if she is happy with this level or if she wants to increase the medication a bit.   Your blood sugar was just a little elevated- this could indicate pre-diabetes. I am going to add another test on that will give [place] an idea of your blood sugar averages over the last 3 months. The diet mentioned above will help with this, but we want to keep a close watch. We can recheck this in 6 months, also.   Marc Lowe- please call [facility] and see if we can add an A1c to the blood drawn. Thank you

## 2020-05-31 NOTE — Progress Notes (Signed)
Fasting was just a hair elevated. We can check when we recheck TSH.

## 2020-06-07 ENCOUNTER — Other Ambulatory Visit: Payer: Self-pay | Admitting: Physician Assistant

## 2020-06-07 ENCOUNTER — Telehealth: Payer: Self-pay

## 2020-06-07 DIAGNOSIS — R413 Other amnesia: Secondary | ICD-10-CM

## 2020-06-07 NOTE — Telephone Encounter (Signed)
Pt said that he was supposed to get a referral to Neuro during [place] on 05/29/2020. Pt states he has not heard anything about scheduling yet. Looking in the office note and imaging results from 03/14/2020 and 04/04/2020, I do not see any mention of a Neuro referral. Please advise.

## 2020-06-07 NOTE — Telephone Encounter (Signed)
Thank you, Kristen. I failed to put the referral in, but do remember him mentioning it during the visit. I have placed the referral for him to [place] neurolog[facility].

## 2020-06-12 NOTE — Telephone Encounter (Signed)
Pt aware referral was entered and stated that [facility] tried to call him earlier to schedule an OV. Instructed pt to call them back to get scheduled. No further questions or concerns at this time.

## 2020-07-03 ENCOUNTER — Telehealth: Payer: Self-pay | Admitting: Neurology

## 2020-07-03 NOTE — Telephone Encounter (Signed)
Patient called to see if we sent all information to [facility] for his appt in January. Called and made patient aware we are in the same system. He will call back with any issues.

## 2020-07-06 ENCOUNTER — Telehealth: Payer: Self-pay | Admitting: Neurology

## 2020-07-06 DIAGNOSIS — M5030 Other cervical disc degeneration, unspecified cervical region: Secondary | ICD-10-CM

## 2020-07-06 DIAGNOSIS — M5412 Radiculopathy, cervical region: Secondary | ICD-10-CM

## 2020-07-06 NOTE — Telephone Encounter (Signed)
Patient left vm stating he didn't want referral to Neurology, he needed a neuroSURGEON for his neck issues.  It looks like referral was made for memory issues. Please advise if that is needed and if okay to send new referral to Neurosurgery to evaluate neck pain? Pended.

## 2020-07-06 NOTE — Telephone Encounter (Signed)
Called patient and made him aware

## 2020-07-06 NOTE — Telephone Encounter (Signed)
Orders signed. Thank you for pending. I was under the impression neurology was what he requested, thank you for clarification.

## 2020-07-19 ENCOUNTER — Telehealth: Payer: Self-pay | Admitting: Physician Assistant

## 2020-07-19 DIAGNOSIS — M5412 Radiculopathy, cervical region: Secondary | ICD-10-CM

## 2020-07-19 DIAGNOSIS — M5030 Other cervical disc degeneration, unspecified cervical region: Secondary | ICD-10-CM

## 2020-07-19 DIAGNOSIS — M4802 Spinal stenosis, cervical region: Secondary | ICD-10-CM

## 2020-07-19 NOTE — Telephone Encounter (Signed)
pts wife requested referral for david jones for husband.

## 2020-08-14 ENCOUNTER — Ambulatory Visit: Payer: Medicare Other | Admitting: Neurology

## 2020-08-17 ENCOUNTER — Other Ambulatory Visit: Payer: Self-pay | Admitting: Physician Assistant

## 2020-08-22 ENCOUNTER — Telehealth: Payer: Self-pay | Admitting: Neurology

## 2020-08-22 DIAGNOSIS — E039 Hypothyroidism, unspecified: Secondary | ICD-10-CM

## 2020-08-22 DIAGNOSIS — R7301 Impaired fasting glucose: Secondary | ICD-10-CM

## 2020-08-22 DIAGNOSIS — R7989 Other specified abnormal findings of blood chemistry: Secondary | ICD-10-CM

## 2020-08-22 NOTE — Telephone Encounter (Signed)
Patient called to make sure we have ordered his thyroid lab to be complete. Lab ordered. He will have this done.

## 2020-08-24 ENCOUNTER — Other Ambulatory Visit: Payer: Self-pay

## 2020-08-24 LAB — HEMOGLOBIN A1C
Hgb A1c MFr Bld: 5.6 % of total Hgb (ref <5.7)
Mean Plasma Glucose: 114 mg/dL
eAG (mmol/L): 6.3 mmol/L

## 2020-08-24 LAB — TSH: TSH: 2.2 m[IU]/L (ref 0.40–4.50)

## 2020-08-24 MED ORDER — LEVOTHYROXINE SODIUM 25 MCG PO TABS
25.0000 ug | ORAL_TABLET | Freq: Every day | ORAL | 1 refills | Status: DC
Start: 2020-08-24 — End: 2021-02-07

## 2020-08-24 NOTE — Telephone Encounter (Signed)
Bryer,   Thyroid looks GREAT.  A1C in normal range. Very close to the pre-diabetes cut off but looks good. Recheck in 6 months to a 1 year. Continue to limit sugars and carbs and get regular exercise.

## 2020-10-05 ENCOUNTER — Encounter: Payer: Self-pay | Admitting: Family Medicine

## 2020-10-05 ENCOUNTER — Ambulatory Visit (INDEPENDENT_AMBULATORY_CARE_PROVIDER_SITE_OTHER): Payer: Medicare Other | Admitting: Family Medicine

## 2020-10-05 ENCOUNTER — Ambulatory Visit: Payer: Medicare Other | Admitting: Family Medicine

## 2020-10-05 ENCOUNTER — Other Ambulatory Visit: Payer: Self-pay

## 2020-10-05 VITALS — BP 115/76 | HR 67 | Wt 228.1 lb

## 2020-10-05 DIAGNOSIS — B351 Tinea unguium: Secondary | ICD-10-CM

## 2020-10-05 NOTE — Patient Instructions (Signed)
Fungal Nail Infection A fungal nail infection is a common infection of the toenails or fingernails. This condition affects toenails more often than fingernails. It often affects the great, or big, toes. More than one nail may be infected. The condition can be passed from person to person (is contagious). What are the causes? This condition is caused by a fungus. Several types of fungi can cause the infection. These fungi are common in moist and warm areas. If your hands or feet come into contact with the fungus, it may get into a crack in your fingernail or toenail and cause the infection. What increases the risk? The following factors may make you more likely to develop this condition:  Being male.  Being of older age.  Living with someone who has the fungus.  Walking barefoot in areas where the fungus thrives, such as showers or locker rooms.  Wearing shoes and socks that cause your feet to sweat.  Having a nail injury or a recent nail surgery.  Having certain medical conditions, such as: ? Athlete's foot. ? Diabetes. ? Psoriasis. ? Poor circulation. ? A weak body defense system (immune system). What are the signs or symptoms? Symptoms of this condition include:  A pale spot on the nail.  Thickening of the nail.  A nail that becomes yellow or brown.  A brittle or ragged nail edge.  A crumbling nail.  A nail that has lifted away from the nail bed.   How is this diagnosed? This condition is diagnosed with a physical exam. Your health care provider may take a scraping or clipping from your nail to test for the fungus. How is this treated? Treatment is not needed for mild infections. If you have significant nail changes, treatment may include:  Antifungal medicines taken by mouth (orally). You may need to take the medicine for several weeks or several months, and you may not see the results for a long time. These medicines can cause side effects. Ask your health care  provider what problems to watch for.  Antifungal nail polish or nail cream. These may be used along with oral antifungal medicines.  Laser treatment of the nail.  Surgery to remove the nail. This may be needed for the most severe infections. It can take a long time, usually up to a year, for the infection to go away. The infection may also come back.   Follow these instructions at home: Medicines  Take or apply over-the-counter and prescription medicines only as told by your health care provider.  Ask your health care provider about using over-the-counter mentholated ointment on your nails. Nail care  Trim your nails often.  Wash and dry your hands and feet every day.  Keep your feet dry: ? Wear absorbent socks, and change your socks frequently. ? Wear shoes that allow air to circulate, such as sandals or canvas tennis shoes. Throw out old shoes.  Do not use artificial nails.  If you go to a nail salon, make sure you choose one that uses clean instruments.  Use antifungal foot powder on your feet and in your shoes. General instructions  Do not share personal items, such as towels or nail clippers.  Do not walk barefoot in shower rooms or locker rooms.  Wear rubber gloves if you are working with your hands in wet areas.  Keep all follow-up visits as told by your health care provider. This is important. Contact a health care provider if: Your infection is not getting better or   it is getting worse after several months. Summary  A fungal nail infection is a common infection of the toenails or fingernails.  Treatment is not needed for mild infections. If you have significant nail changes, treatment may include taking medicine orally and applying medicine to your nails.  It can take a long time, usually up to a year, for the infection to go away. The infection may also come back.  Take or apply over-the-counter and prescription medicines only as told by your health care  provider.  Follow instructions for taking care of your nails to help prevent infection from coming back or spreading. This information is not intended to replace advice given to you by your health care provider. Make sure you discuss any questions you have with your health care provider. Document Revised: 10/22/2018 Document Reviewed: 12/05/2017 Elsevier Patient Education  2021 Elsevier Inc.  

## 2020-10-05 NOTE — Progress Notes (Signed)
Acute Office Visit  Subjective:    Patient ID: Marc Lowe, male    DOB: 1950/11/25, 70 y.o.   MRN: [id number]  Chief Complaint  Patient presents with  . Nail Problem    HPI Patient is in today for toe nail changes.  States about 2 weeks ago he noticed his left great toe nail was yellow and black discolored and seemed thicker on the underside. He has not had any pain or drainage, rashes, edema, or erythema. He does not recall any trauma that would've bruised the nail although he has been trimming, picking and pulling at the nail since he started to notice the changes. He also notes his 5th toe on the left foot is starting to have a little bit of the black appearance at one edge as well. Several toes on right foot have had the typical yellow-ish thickening appearance associated with fungus.  He is not interested in medication, he just wants to make sure this is just a fungus and nothing else more serious.      Past Medical History:  Diagnosis Date  . Diverticulitis     No past surgical history on file.  Family History  Problem Relation Age of Onset  . Atrial fibrillation Father     Social History   Socioeconomic History  . Marital status: Married    Spouse name: Tillman Abide  . Number of children: 4  . Years of education: 66  . Highest education level: Bachelor's degree (e.g., BA, AB, BS)  Occupational History  . Occupation: Chief Strategy Officer    Comment: retired  Tobacco Use  . Smoking status: Never Smoker  . Smokeless tobacco: Never Used  Vaping Use  . Vaping Use: Never used  Substance and Sexual Activity  . Alcohol use: Yes    Alcohol/week: 3.0 standard drinks    Types: 3 Glasses of wine per week    Comment: 4 ounces every couple of days  . Drug use: No  . Sexual activity: Yes    Partners: Female  Other Topics Concern  . Not on file  Social History Narrative   Plays golf once a week. 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 Difficulty of Paying Living Expenses: Not hard at all  Food Insecurity: No Food Insecurity  . Worried About Charity fundraiser in the Last Year: Never true  . Ran Out of Food in the Last Year: Never true  Transportation Needs: No Transportation Needs  . Lack of Transportation (Medical): No  . Lack of Transportation (Non-Medical): No  Physical Activity: Sufficiently Active  . Days of Exercise per Week: 5 days  . Minutes of Exercise per Session: 100 min  Stress: No Stress Concern Present  . Feeling of Stress : Not at all  Social Connections: Moderately Integrated  . Frequency of Communication with Friends and Family: More than three times a week  . Frequency of Social Gatherings with Friends and Family: More than three times a week  . Attends Religious Services: Never  . Active Member of Clubs or Organizations: Yes  . Attends Archivist Meetings: More than 4 times per year  . Marital Status: Married  Human resources officer Violence: Not At Risk  . Fear of Current or Ex-Partner: No  . Emotionally Abused: No  . Physically Abused: No  . Sexually Abused: No    Outpatient Medications Prior to Visit  Medication  Sig Dispense Refill  . ascorbic acid (VITAMIN C) 500 MG tablet Take 500 mg by mouth daily.    [name] Kitchen aspirin 81 MG EC tablet Take 81 mg by mouth daily.    . B COMPLEX-C ER PO Take 1 tablet by mouth daily.    . Cholecalciferol 25 MCG (1000 UT) capsule Take 1 tablet by mouth daily.    . cyclobenzaprine (FLEXERIL) 10 MG tablet Take 1 tablet (10 mg total) by mouth 3 (three) times daily as needed for muscle spasms. 30 tablet 0  . diclofenac sodium (VOLTAREN) 1 % GEL Apply 4 g topically 4 (four) times daily. To affected joint. 100 g 1  . ezetimibe (ZETIA) 10 MG tablet Take 1 tablet (10 mg total) by mouth daily. 90 tablet 3  . levothyroxine (EUTHYROX) 25 MCG tablet Take 1 tablet (25 mcg total) by mouth daily before breakfast. 90 tablet 1   . magnesium gluconate (MAGONATE) 500 MG tablet Take 500 mg by mouth daily.    . Sildenafil Citrate (VIAGRA PO) Take by mouth.    . Turmeric 500 MG CAPS Take 1 capsule by mouth daily.    . Vilazodone HCl (VIIBRYD) 20 MG TABS Take 1 tablet (20 mg total) by mouth daily. 90 tablet 3  . Zinc Sulfate (ZINC 15 PO) Take 50 mg by mouth every morning.     No facility-administered medications prior to visit.    Allergies  Allergen Reactions  . Hydromorphone Nausea And Vomiting  . Atorvastatin     Nausea, reflux, abdominal pain  . Rosuvastatin     REACTION: n/v    Review of Systems All review of systems negative except what is listed in the HPI     Objective:    Physical Exam Constitutional:      Appearance: Normal appearance.  Skin:    General: Skin is warm and dry.     Comments: Left great toe nail with thickening, yellow and black discoloration as seen in picture below. No erythema, edema, lesions, rashes  Neurological:     General: No focal deficit present.     Mental Status: He is alert and oriented to person, place, and time.  Psychiatric:        Mood and Affect: Mood normal.        Behavior: Behavior normal.        Thought Content: Thought content normal.        Judgment: Judgment normal.        BP 115/76   Pulse 67   Wt 228 lb 1.6 oz (103.5 kg)   SpO2 98%   BMI 29.29 kg/m  Wt Readings from Last 3 Encounters:  10/05/20 228 lb 1.6 oz (103.5 kg)  05/29/20 226 lb 14.4 oz (102.9 kg)  04/04/20 225 lb (102.1 kg)    Health Maintenance Due  Topic Date Due  . COVID-19 Vaccine (3 - Pfizer risk 4-dose series) 11/18/2019    There are no preventive care reminders to display for this patient.   Lab Results  Component Value Date   TSH 2.20 08/23/2020   Lab Results  Component Value Date   WBC 4.0 03/14/2020   HGB 15.7 03/14/2020   HCT 45.9 03/14/2020   MCV 87.4 03/14/2020   PLT 197 03/14/2020   Lab Results  Component Value Date   NA 140 05/29/2020   K 4.8  05/29/2020   CO2 28 05/29/2020   GLUCOSE 101 (H) 05/29/2020   BUN 15 05/29/2020   CREATININE 0.89 05/29/2020  BILITOT 0.4 05/29/2020   ALKPHOS 60 12/16/2016   AST 24 05/29/2020   ALT 33 05/29/2020   PROT 6.6 05/29/2020   ALBUMIN 3.9 12/16/2016   CALCIUM 9.4 05/29/2020   Lab Results  Component Value Date   CHOL 205 (H) 05/29/2020   Lab Results  Component Value Date   HDL 57 05/29/2020   Lab Results  Component Value Date   LDLCALC 127 (H) 05/29/2020   Lab Results  Component Value Date   TRIG 100 05/29/2020   Lab Results  Component Value Date   CHOLHDL 3.6 05/29/2020   Lab Results  Component Value Date   HGBA1C 5.6 08/23/2020       Assessment & Plan:   1. Onychomycosis of left great toe  Presentation consistent with a hyperkeratotic onychomycosis. Discussed options with patient. He would like to go ahead and send a fungal culture to confirm diagnosis. Educated that topical medication may not be helpful as it appears the worst of the fungus is under the nail. Black discoloration could be fungal or blood from where he has been messing with the nail, pulling it back, scratching, trimming. Discussed risk vs benefit of oral medications, and patient states he is not interested in oral medications. Handout provided with information on supportive care, hygiene, etc. Educated on signs of infection that would require further evaluation. Will update him with culture results  - Fungus Culture with Smear  Follow-up if symptoms worsen or fail to improve.   Terrilyn Saver, NP

## 2020-10-10 NOTE — Progress Notes (Signed)
MyChart message sent: It looks like the nail infection is related to a fungus. Continue the supportive therapy we discussed. If it worsens, or does not start improving over time as the nail grows out and you would like to consider oral therapy. Please schedule an appointment with Luvenia Starch to discuss your options.

## 2020-10-15 LAB — CULT, FUNGUS, SKIN,HAIR,NAIL W/KOH
MICRO NUMBER:: 11699336
SPECIMEN QUALITY:: ADEQUATE

## 2020-12-01 ENCOUNTER — Telehealth: Payer: Self-pay | Admitting: Physician Assistant

## 2020-12-01 NOTE — Chronic Care Management (AMB) (Signed)
  Chronic Care Management   Note  12/01/2020 Name: Marc Lowe MRN: [id number] DOB: 12-03-1950  Marc Lowe is a 70 y.o. year old male who is a primary care patient of Donella Stade, [place]. I reached out to [name] by phone today in response to a referral sent by [name]'s PCP, Donella Stade, PA-C.   [name] was given information about Chronic Care Management services today including:  1. CCM service includes personalized support from designated clinical staff supervised by his physician, including individualized plan of care and coordination with other care providers 2. 24/7 contact phone numbers for assistance for urgent and routine care needs. 3. Service will only be billed when office clinical staff spend 20 minutes or more in a month to coordinate care. 4. Only one practitioner may furnish and bill the service in a calendar month. 5. The patient may stop CCM services at any time (effective at the end of the month) by phone call to the office staff.   Patient agreed to services and verbal consent obtained.   Follow up plan:   Lauretta Grill Upstream Scheduler

## 2020-12-18 ENCOUNTER — Telehealth: Payer: Self-pay | Admitting: General Practice

## 2020-12-18 NOTE — Telephone Encounter (Signed)
Transition Care Management Unsuccessful Follow-up Telephone Call  Date of discharge and from where:  [facility] 12/17/20  Attempts:  1st Attempt  Reason for unsuccessful TCM follow-up call:  Left voice message

## 2020-12-19 NOTE — Telephone Encounter (Signed)
Transition Care Management Follow-up Telephone Call  Date of discharge and from where: 12/17/2020 from [facility]  How have you been since you were released from the hospital? Pt stated that he is still having the abdominal pain.   Any questions or concerns? No  Items Reviewed:  Did the pt receive and understand the discharge instructions provided? Yes   Medications obtained and verified? Yes   Other? No   Any new allergies since your discharge? No   Dietary orders reviewed? n/a  Do you have support at home? Yes   Functional Questionnaire: (I = Independent and D = Dependent) ADLs: I  Bathing/Dressing- I  Meal Prep- I  Eating- I  Maintaining continence- I  Transferring/Ambulation- I  Managing Meds- I   Follow up appointments reviewed:   PCP Hospital f/u appt confirmed? No    Specialist Hospital f/u appt confirmed? No    Are transportation arrangements needed? No   If their condition worsens, is the pt aware to call PCP or go to the Emergency Dept.? Yes  Was the patient provided with contact information for the PCP's office or ED? Yes  Was to pt encouraged to call back with questions or concerns? Yes

## 2020-12-19 NOTE — Telephone Encounter (Signed)
Patient called back and left vm. He can be reached at [phone].

## 2020-12-20 ENCOUNTER — Telehealth: Payer: Self-pay | Admitting: Neurology

## 2020-12-20 ENCOUNTER — Telehealth (INDEPENDENT_AMBULATORY_CARE_PROVIDER_SITE_OTHER): Payer: Medicare Other | Admitting: Physician Assistant

## 2020-12-20 VITALS — Ht 74.0 in | Wt 228.0 lb

## 2020-12-20 DIAGNOSIS — U071 COVID-19: Secondary | ICD-10-CM

## 2020-12-20 DIAGNOSIS — R1032 Left lower quadrant pain: Secondary | ICD-10-CM

## 2020-12-20 DIAGNOSIS — Z8719 Personal history of other diseases of the digestive system: Secondary | ICD-10-CM | POA: Diagnosis not present

## 2020-12-20 MED ORDER — HYDROCODONE-ACETAMINOPHEN 5-325 MG PO TABS: 1.0000 | ORAL_TABLET | Freq: Four times a day (QID) | ORAL | 0 refills | Status: AC | PRN

## 2020-12-20 MED ORDER — CIPROFLOXACIN HCL 500 MG PO TABS: 500.0000 mg | ORAL_TABLET | Freq: Two times a day (BID) | ORAL | 0 refills | Status: DC

## 2020-12-20 MED ORDER — METRONIDAZOLE 500 MG PO TABS: 500.0000 mg | ORAL_TABLET | Freq: Three times a day (TID) | ORAL | 0 refills | Status: DC

## 2020-12-20 NOTE — Progress Notes (Signed)
Patient ID: Marc Lowe, male   DOB: June 18, 1951, 70 y.o.   MRN: [id number] .[name] KitchenVirtual Visit via Video Note  I connected with Marc Lowe on 12/21/20 at  3:20 PM EDT by a video enabled telemedicine application and verified that I am speaking with the correct person using two identifiers.  Location: Patient: home Provider: clinic  .[name] KitchenParticipating in visit:  Patient: Marc Lowe Patient wife Provider: Iran Planas PA-C   I discussed the limitations of evaluation and management by telemedicine and the availability of in person appointments. The patient expressed understanding and agreed to proceed.  History of Present Illness: Pt is a 70 yo male with hx of diverticulitis and recent covid 19 infection who presents to the clinic via video with wife. He has had URI symptoms last week but tested positive for covid on Sunday. He started with abdominal pain that same day left lower abdomen sharp that radiated to the center and then back accompanied by diarrhea. He went to ED. Labs unremarkable. CT unremarkable. He states "it feels just like my diverticulitis". He has a low grade fever today. He feels very weak and just wants to sleep. He has no appetite. He is having no trouble breathing. He wants something for pain.    .. 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   HYPOGONADISM, MALE 11/04/2006   Depression, recurrent ([place]) 11/04/2006   VARICES OF OTHER SITES 10/04/2009   CHRONIC RHINITIS 06/04/2007   PARONYCHIA, FINGER 07/13/2009   CELLULITIS AND ABSCESS OF OTHER SPECIFIED SITE 05/09/2008   ACNE ROSACEA 10/07/2006   SKIN RASH 07/13/2009   DIARRHEA 03/14/2010   ABDOMINAL PAIN, GENERALIZED 03/14/2010   IMPAIRED FASTING GLUCOSE 07/24/2009   ELEVATED PROSTATE SPECIFIC ANTIGEN 04/10/2009   LIBIDO, DECREASED 10/07/2006   History of bowel resection 04/14/2014   Testosterone deficiency 04/14/2014   Hyperlipidemia 04/15/2014   Cervical nerve root  impingement 04/11/2015   Left lower quadrant abdominal pain 05/08/2016   History of diverticulitis 05/08/2016   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   Family history of atrial fibrillation 06/11/2016   Left shoulder pain 11/21/2016   Toenail fungus 12/16/2016   No energy 12/17/2016   Elevated TSH 12/18/2016   Anhedonia 05/13/2017   Abnormal weight gain 05/13/2017   Cervical radiculopathy 02/18/2020   DDD (degenerative disc disease), cervical 02/18/2020   Mixed hyperlipidemia 02/18/2020   Memory changes 02/18/2020   Seborrheic keratoses 02/21/2020   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  Observations/Objective: Pale and weak appearance Holding left lower abdomen.  Any touching or moving causes pain.   .. Today's Vitals   12/20/20 1420  Weight: 228 lb (103.4 kg)  Height: 6\' 2"  (1.88 m)   Body mass index is 29.27 kg/m.    Assessment and Plan: [name] KitchenMarland KitchenWendy was seen today for abdominal pain.  Diagnoses and all orders for this visit:  COVID-19 virus infection  Left lower quadrant abdominal pain -     HYDROcodone-acetaminophen (NORCO/VICODIN) 5-325 MG tablet; Take 1 tablet by mouth every 6 (six) hours as needed for up to 5 days for moderate pain. -     ciprofloxacin (CIPRO) 500 MG tablet; Take 1 tablet (  500 mg total) by mouth 2 (two) times daily for 10 days. -     metroNIDAZOLE (FLAGYL) 500 MG tablet; Take 1 tablet (500 mg total) by mouth 3 (three) times daily for 10 days.  History of diverticulitis -     ciprofloxacin (CIPRO) 500 MG tablet; Take 1 tablet (500 mg total) by mouth 2 (two) times daily for 10 days. -     metroNIDAZOLE (FLAGYL) 500 MG tablet; Take 1  tablet (500 mg total) by mouth 3 (three) times daily for 10 days.   Pt has a hx of diverticulitis and pain is focused in this area and worsening. He has a low grade fever as well. He went to ED within an hour of symptoms. I am concerned that CT was done too soon. Labs were reassuring. I am going to empirically treat for 48 hours if not significantly improved he can stop cipro/metronidazole and continue symptomatic care. Stay hydrated and rest.   Pt is in 8/10 pain with moving. Small quantity of norco given.  [name] KitchenMarland KitchenPDMP reviewed during this encounter. Warning of constipation with pain medication.   Pt did test positive for covid 19 by home test last week. Outside of window to treat with antiviral. Symptomatic care discussed. Follow up with any worsening breathing symptoms.   Follow Up Instructions:    I discussed the assessment and treatment plan with the patient. The patient was provided an opportunity to ask questions and all were answered. The patient agreed with the plan and demonstrated an understanding of the instructions.   The patient was advised to call back or seek an in-person evaluation if the symptoms worsen or if the condition fails to improve as anticipated.   Iran Planas, PA-C

## 2020-12-20 NOTE — Telephone Encounter (Signed)
Spoke with patient, appt made

## 2020-12-20 NOTE — Telephone Encounter (Signed)
Ok to virtual double book at end of the day. To get pain rx I have to have some type of documentation and had some follow up questions to ask.

## 2020-12-20 NOTE — Telephone Encounter (Signed)
Patient tested positive for Covid on Sunday. He is okay other than excruciating low abdominal pain. He went to the ER where everything was normal. He doesn't know what to do now as he is still having abdominal pain. He is asking for pain medication. Would you like to do a virtual visit?

## 2020-12-20 NOTE — Progress Notes (Signed)
No Covid symptoms, positive Sunday Went to ED Sunday pm for abdominal pain Lower abdomen, started LLQ, went to center, back to left cramping Diarrhea  No nausea, no appetite, has only eaten banana and toast (no loss of taste though)  Has not taken anything OTC for stomach

## 2020-12-21 ENCOUNTER — Encounter: Payer: Self-pay | Admitting: Physician Assistant

## 2020-12-22 ENCOUNTER — Encounter: Payer: Self-pay | Admitting: Physician Assistant

## 2020-12-22 DIAGNOSIS — Z8719 Personal history of other diseases of the digestive system: Secondary | ICD-10-CM

## 2020-12-22 MED ORDER — HYOSCYAMINE SULFATE 0.125 MG PO TABS: 0.1250 mg | ORAL_TABLET | ORAL | 3 refills | Status: DC | PRN

## 2020-12-22 NOTE — Telephone Encounter (Signed)
I spent 5 total minutes of online digital evaluation and management services. 

## 2020-12-22 NOTE — Assessment & Plan Note (Signed)
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

## 2020-12-27 ENCOUNTER — Telehealth (INDEPENDENT_AMBULATORY_CARE_PROVIDER_SITE_OTHER): Payer: Medicare Other | Admitting: Physician Assistant

## 2020-12-27 ENCOUNTER — Inpatient Hospital Stay (HOSPITAL_BASED_OUTPATIENT_CLINIC_OR_DEPARTMENT_OTHER)
Admission: EM | Admit: 2020-12-27 | Discharge: 2020-12-29 | DRG: 175 | Disposition: A | Discharge status: Home Health | Payer: Medicare Other | Attending: Internal Medicine | Admitting: Internal Medicine

## 2020-12-27 ENCOUNTER — Encounter (HOSPITAL_BASED_OUTPATIENT_CLINIC_OR_DEPARTMENT_OTHER): Payer: Self-pay | Admitting: Emergency Medicine

## 2020-12-27 ENCOUNTER — Other Ambulatory Visit: Payer: Self-pay

## 2020-12-27 ENCOUNTER — Emergency Department (HOSPITAL_BASED_OUTPATIENT_CLINIC_OR_DEPARTMENT_OTHER): Payer: Medicare Other

## 2020-12-27 ENCOUNTER — Encounter: Payer: Self-pay | Admitting: Physician Assistant

## 2020-12-27 ENCOUNTER — Encounter (HOSPITAL_BASED_OUTPATIENT_CLINIC_OR_DEPARTMENT_OTHER): Payer: Self-pay

## 2020-12-27 ENCOUNTER — Inpatient Hospital Stay (HOSPITAL_BASED_OUTPATIENT_CLINIC_OR_DEPARTMENT_OTHER)
Admission: RE | Admit: 2020-12-27 | Discharge: 2020-12-27 | Disposition: A | Discharge status: Home / Self Care | Payer: Medicare Other | Source: Ambulatory Visit | Attending: Physician Assistant | Admitting: Physician Assistant

## 2020-12-27 VITALS — BP 152/81 | HR 90 | Temp 100.0°F | Ht 74.0 in | Wt 228.0 lb

## 2020-12-27 DIAGNOSIS — M549 Dorsalgia, unspecified: Secondary | ICD-10-CM | POA: Insufficient documentation

## 2020-12-27 DIAGNOSIS — R252 Cramp and spasm: Secondary | ICD-10-CM | POA: Diagnosis not present

## 2020-12-27 DIAGNOSIS — R0782 Intercostal pain: Secondary | ICD-10-CM

## 2020-12-27 DIAGNOSIS — E871 Hypo-osmolality and hyponatremia: Secondary | ICD-10-CM | POA: Diagnosis not present

## 2020-12-27 DIAGNOSIS — E785 Hyperlipidemia, unspecified: Secondary | ICD-10-CM | POA: Diagnosis not present

## 2020-12-27 DIAGNOSIS — J9601 Acute respiratory failure with hypoxia: Secondary | ICD-10-CM | POA: Insufficient documentation

## 2020-12-27 DIAGNOSIS — Z79899 Other long term (current) drug therapy: Secondary | ICD-10-CM

## 2020-12-27 DIAGNOSIS — R0602 Shortness of breath: Secondary | ICD-10-CM | POA: Insufficient documentation

## 2020-12-27 DIAGNOSIS — I82432 Acute embolism and thrombosis of left popliteal vein: Secondary | ICD-10-CM | POA: Diagnosis present

## 2020-12-27 DIAGNOSIS — Z8616 Personal history of COVID-19: Secondary | ICD-10-CM

## 2020-12-27 DIAGNOSIS — K5792 Diverticulitis of intestine, part unspecified, without perforation or abscess without bleeding: Secondary | ICD-10-CM | POA: Diagnosis present

## 2020-12-27 DIAGNOSIS — Z7982 Long term (current) use of aspirin: Secondary | ICD-10-CM | POA: Diagnosis not present

## 2020-12-27 DIAGNOSIS — Z9049 Acquired absence of other specified parts of digestive tract: Secondary | ICD-10-CM | POA: Diagnosis not present

## 2020-12-27 DIAGNOSIS — I82442 Acute embolism and thrombosis of left tibial vein: Secondary | ICD-10-CM | POA: Diagnosis not present

## 2020-12-27 DIAGNOSIS — U071 COVID-19: Secondary | ICD-10-CM | POA: Diagnosis not present

## 2020-12-27 DIAGNOSIS — M5030 Other cervical disc degeneration, unspecified cervical region: Secondary | ICD-10-CM | POA: Diagnosis present

## 2020-12-27 DIAGNOSIS — R531 Weakness: Secondary | ICD-10-CM | POA: Diagnosis not present

## 2020-12-27 DIAGNOSIS — I824Z2 Acute embolism and thrombosis of unspecified deep veins of left distal lower extremity: Secondary | ICD-10-CM

## 2020-12-27 DIAGNOSIS — R5383 Other fatigue: Secondary | ICD-10-CM

## 2020-12-27 DIAGNOSIS — J9811 Atelectasis: Secondary | ICD-10-CM | POA: Diagnosis not present

## 2020-12-27 DIAGNOSIS — I2699 Other pulmonary embolism without acute cor pulmonale: Principal | ICD-10-CM | POA: Diagnosis present

## 2020-12-27 DIAGNOSIS — E039 Hypothyroidism, unspecified: Secondary | ICD-10-CM | POA: Diagnosis not present

## 2020-12-27 DIAGNOSIS — Z888 Allergy status to other drugs, medicaments and biological substances status: Secondary | ICD-10-CM

## 2020-12-27 DIAGNOSIS — Z20822 Contact with and (suspected) exposure to covid-19: Secondary | ICD-10-CM | POA: Diagnosis present

## 2020-12-27 DIAGNOSIS — F419 Anxiety disorder, unspecified: Secondary | ICD-10-CM | POA: Diagnosis present

## 2020-12-27 DIAGNOSIS — F32A Depression, unspecified: Secondary | ICD-10-CM | POA: Diagnosis present

## 2020-12-27 HISTORY — DX: Depression, unspecified: F32.A

## 2020-12-27 LAB — TROPONIN I (HIGH SENSITIVITY): Troponin I (High Sensitivity): 4 ng/L (ref <18)

## 2020-12-27 LAB — COMPREHENSIVE METABOLIC PANEL
ALT: 25 U/L (ref 0–44)
AST: 16 U/L (ref 15–41)
Albumin: 3 g/dL — ABNORMAL LOW (ref 3.5–5.0)
Alkaline Phosphatase: 51 U/L (ref 38–126)
Anion gap: 7 (ref 5–15)
BUN: 14 mg/dL (ref 8–23)
CO2: 27 mmol/L (ref 22–32)
Calcium: 8.2 mg/dL — ABNORMAL LOW (ref 8.9–10.3)
Chloride: 97 mmol/L — ABNORMAL LOW (ref 98–111)
Creatinine, Ser: 1.02 mg/dL (ref 0.61–1.24)
GFR, Estimated: >60 mL/min (ref >60)
Glucose, Bld: 123 mg/dL — ABNORMAL HIGH (ref 70–99)
Potassium: 3.9 mmol/L (ref 3.5–5.1)
Sodium: 131 mmol/L — ABNORMAL LOW (ref 135–145)
Total Bilirubin: 0.5 mg/dL (ref 0.3–1.2)
Total Protein: 6.4 g/dL — ABNORMAL LOW (ref 6.5–8.1)

## 2020-12-27 LAB — CBC WITH DIFFERENTIAL/PLATELET
Abs Immature Granulocytes: 0.05 10*3/uL (ref 0.00–0.07)
Basophils Absolute: 0 10*3/uL (ref 0.0–0.1)
Basophils Relative: 0 %
Eosinophils Absolute: 0 10*3/uL (ref 0.0–0.5)
Eosinophils Relative: 0 %
HCT: 40.6 % (ref 39.0–52.0)
Hemoglobin: 13.6 g/dL (ref 13.0–17.0)
Immature Granulocytes: 1 %
Lymphocytes Relative: 8 %
Lymphs Abs: 0.9 10*3/uL (ref 0.7–4.0)
MCH: 29.1 pg (ref 26.0–34.0)
MCHC: 33.5 g/dL (ref 30.0–36.0)
MCV: 86.9 fL (ref 80.0–100.0)
Monocytes Absolute: 1.2 10*3/uL — ABNORMAL HIGH (ref 0.1–1.0)
Monocytes Relative: 11 %
Neutro Abs: 8.6 10*3/uL — ABNORMAL HIGH (ref 1.7–7.7)
Neutrophils Relative %: 80 %
Platelets: 227 10*3/uL (ref 150–400)
RBC: 4.67 MIL/uL (ref 4.22–5.81)
RDW: 13.2 % (ref 11.5–15.5)
WBC: 10.7 10*3/uL — ABNORMAL HIGH (ref 4.0–10.5)
nRBC: 0 % (ref 0.0–0.2)

## 2020-12-27 LAB — RESP PANEL BY RT-PCR (FLU A&B, COVID) ARPGX2
Influenza A by PCR: NEGATIVE
Influenza B by PCR: NEGATIVE
SARS Coronavirus 2 by RT PCR: POSITIVE — AB

## 2020-12-27 LAB — HEPARIN LEVEL (UNFRACTIONATED): Heparin Unfractionated: <0.1 IU/mL — ABNORMAL LOW (ref 0.30–0.70)

## 2020-12-27 MED ORDER — CYCLOBENZAPRINE HCL 10 MG PO TABS: 10.0000 mg | ORAL_TABLET | Freq: Three times a day (TID) | ORAL | Status: DC | PRN

## 2020-12-27 MED ORDER — HEPARIN BOLUS VIA INFUSION
6500.0000 [IU] | Freq: Once | INTRAVENOUS | Status: AC
  Administered 2020-12-27: 6500 [IU] via INTRAVENOUS

## 2020-12-27 MED ORDER — VILAZODONE HCL 20 MG PO TABS
20.0000 mg | ORAL_TABLET | Freq: Every day | ORAL | Status: DC
  Administered 2020-12-27 – 2020-12-29 (×3): 20 mg via ORAL
  Filled 2020-12-27 (×4): qty 1

## 2020-12-27 MED ORDER — RISAQUAD PO CAPS
2.0000 | ORAL_CAPSULE | Freq: Three times a day (TID) | ORAL | Status: DC
  Administered 2020-12-27 – 2020-12-29 (×5): 2 via ORAL
  Filled 2020-12-27 (×5): qty 2

## 2020-12-27 MED ORDER — ASPIRIN 81 MG PO TBEC: 81.0000 mg | DELAYED_RELEASE_TABLET | Freq: Every day | ORAL | Status: DC

## 2020-12-27 MED ORDER — ACETAMINOPHEN 325 MG PO TABS: 650.0000 mg | ORAL_TABLET | Freq: Four times a day (QID) | ORAL | Status: DC | PRN

## 2020-12-27 MED ORDER — IOHEXOL 350 MG/ML SOLN
100.0000 mL | Freq: Once | INTRAVENOUS | Status: AC | PRN
  Administered 2020-12-27: 100 mL via INTRAVENOUS

## 2020-12-27 MED ORDER — LEVOTHYROXINE SODIUM 25 MCG PO TABS
25.0000 ug | ORAL_TABLET | Freq: Every day | ORAL | Status: DC
  Administered 2020-12-28 – 2020-12-29 (×2): 25 ug via ORAL
  Filled 2020-12-27 (×2): qty 1

## 2020-12-27 MED ORDER — HEPARIN (PORCINE) 25000 UT/250ML-% IV SOLN
2500.0000 [IU]/h | INTRAVENOUS | Status: DC
  Administered 2020-12-27: 1700 [IU]/h via INTRAVENOUS
  Administered 2020-12-27: 2000 [IU]/h via INTRAVENOUS
  Administered 2020-12-28: 2500 [IU]/h via INTRAVENOUS
  Administered 2020-12-28: 2350 [IU]/h via INTRAVENOUS
  Filled 2020-12-27 (×5): qty 250

## 2020-12-27 MED ORDER — AMOXICILLIN-POT CLAVULANATE 875-125 MG PO TABS
1.0000 | ORAL_TABLET | Freq: Two times a day (BID) | ORAL | Status: DC
  Administered 2020-12-27 – 2020-12-29 (×4): 1 via ORAL
  Filled 2020-12-27 (×4): qty 1

## 2020-12-27 MED ORDER — HEPARIN BOLUS VIA INFUSION
3100.0000 [IU] | Freq: Once | INTRAVENOUS | Status: AC
  Administered 2020-12-27: 3100 [IU] via INTRAVENOUS
  Filled 2020-12-27: qty 3100

## 2020-12-27 MED ORDER — EZETIMIBE 10 MG PO TABS: 10.0000 mg | ORAL_TABLET | Freq: Every day | ORAL | Status: DC

## 2020-12-27 MED ORDER — ASCORBIC ACID 500 MG PO TABS
500.0000 mg | ORAL_TABLET | Freq: Every day | ORAL | Status: DC
  Administered 2020-12-28 – 2020-12-29 (×2): 500 mg via ORAL
  Filled 2020-12-27 (×2): qty 1

## 2020-12-27 MED ORDER — ACETAMINOPHEN 650 MG RE SUPP: 650.0000 mg | Freq: Four times a day (QID) | RECTAL | Status: DC | PRN

## 2020-12-27 NOTE — Progress Notes (Signed)
Sent to ED

## 2020-12-27 NOTE — Progress Notes (Signed)
Diverticulitis better, still on antibiotics til Saturday Pain with urination Pain in middle back, feels like muscle Chest pain with deep breath, o2 is reading low 86% but no trouble breathing (wife checked it on her and o2 was normal

## 2020-12-27 NOTE — Progress Notes (Signed)
Patient ID: Marc Lowe, male   DOB: 1951/02/20, 70 y.o.   MRN: [id number] .[name] KitchenVirtual Visit via Video Note  I connected with Marc Lowe on 12/27/20 at  7:50 AM EDT by a video enabled telemedicine application and verified that I am speaking with the correct person using two identifiers.  Location: Patient: home Provider: clinic  .[name] KitchenParticipating in visit:  Patient: Marc Lowe  Provider: Iran Planas PA-C   I discussed the limitations of evaluation and management by telemedicine and the availability of in person appointments. The patient expressed understanding and agreed to proceed.  History of Present Illness: Pt is a 70 yo male who presents to the clinic with his wife via video. He had covid symptoms start 2 weeks ago. He tested positive last Sunday. He went to ED and given symptomatic care. He was seen last week virtually and treated for diverticulitis. His diverticulitis symptoms have resolved. He continues to have significant SOB, weakness, fatigue, right upper back cramping and pain. He has dry cough. No edema. He is able to lay flat. Pulse ox at home is 86 percent which was 96 percent last week and wifes pulse ox is normal.   .. 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   HYPOGONADISM, MALE 11/04/2006   Depression, recurrent ([place]) 11/04/2006   VARICES OF OTHER SITES 10/04/2009   CHRONIC RHINITIS 06/04/2007   PARONYCHIA, FINGER 07/13/2009   CELLULITIS AND ABSCESS OF OTHER SPECIFIED SITE 05/09/2008   ACNE ROSACEA 10/07/2006   SKIN RASH 07/13/2009   DIARRHEA 03/14/2010   ABDOMINAL PAIN, GENERALIZED 03/14/2010   IMPAIRED FASTING GLUCOSE 07/24/2009   ELEVATED PROSTATE SPECIFIC ANTIGEN 04/10/2009   LIBIDO, DECREASED 10/07/2006   History of bowel resection 04/14/2014   Testosterone deficiency 04/14/2014   Hyperlipidemia 04/15/2014   Cervical nerve root impingement 04/11/2015   Left lower quadrant abdominal pain 05/08/2016    History of diverticulitis 05/08/2016   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   Family history of atrial fibrillation 06/11/2016   Left shoulder pain 11/21/2016   Toenail fungus 12/16/2016   No energy 12/17/2016   Elevated TSH 12/18/2016   Anhedonia 05/13/2017   Abnormal weight gain 05/13/2017   Cervical radiculopathy 02/18/2020   DDD (degenerative disc disease), cervical 02/18/2020   Mixed hyperlipidemia 02/18/2020   Memory changes 02/18/2020   Seborrheic keratoses 02/21/2020   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  Observations/Objective: No acute distress Pale and weak appearance Leaning forward on video call with somewhat pursed breathing No cough or wheezing  .[name] Kitchen Today's Vitals   12/27/20 0751  BP: (!) 152/81  Pulse: 90  Temp: 100 F (37.8 C)  TempSrc: Oral  Weight: 228 lb (103.4 kg)  Height: 6\' 2"  (1.88 m)   Body mass index is 29.27 kg/m.    Assessment and Plan: [name] KitchenMarland KitchenFarooq was seen today for abdominal pain.  Diagnoses and all orders for this visit:  COVID-19 virus infection -     C-reactive protein -     COMPLETE METABOLIC PANEL WITH GFR -     Magnesium -     CBC with Differential/Platelet -  CK -     CT Angio Chest W/Cm &/Or Wo Cm; Future  SOB (shortness of breath) -     C-reactive protein -     COMPLETE METABOLIC PANEL WITH GFR -     Magnesium -     CBC with Differential/Platelet -     CK -     CT Angio Chest W/Cm &/Or Wo Cm; Future  Weakness -     C-reactive protein -     COMPLETE METABOLIC PANEL WITH GFR -     Magnesium -     CBC with Differential/Platelet -      CK  Muscle cramping -     C-reactive protein -     COMPLETE METABOLIC PANEL WITH GFR -     Magnesium -     CBC with Differential/Platelet -     CK  Fatigue, unspecified type -     C-reactive protein -     COMPLETE METABOLIC PANEL WITH GFR -     Magnesium -     CBC with Differential/Platelet -     CK  Mid back pain on right side -     C-reactive protein -     COMPLETE METABOLIC PANEL WITH GFR -     Magnesium -     CBC with Differential/Platelet -     CK -     CT Angio Chest W/Cm &/Or Wo Cm; Future  Acute respiratory failure with hypoxia (HCC) -     CT Angio Chest W/Cm &/Or Wo Cm; Future  Intercostal pain -     CT Angio Chest W/Cm &/Or Wo Cm; Future  Post covid concerns about blood clot in lungs. Pulse ox 86 percent in acute respiratory failure. Pt does not want to go to ED. Will get CTA today and stat labs. Need to make sure no organ failure or electrolyte deficiency. Will treat accordingly.     Follow Up Instructions:    I discussed the assessment and treatment plan with the patient. The patient was provided an opportunity to ask questions and all were answered. The patient agreed with the plan and demonstrated an understanding of the instructions.   The patient was advised to call back or seek an in-person evaluation if the symptoms worsen or if the condition fails to improve as anticipated.  I provided 30 minutes of non-face-to-face time during this encounter.   Iran Planas, PA-C

## 2020-12-27 NOTE — Plan of Care (Signed)
Care plan initiated.

## 2020-12-27 NOTE — Progress Notes (Signed)
ANTICOAGULATION CONSULT NOTE - Initial Consult  Pharmacy Consult for IV heparin Indication: pulmonary embolus  Allergies  Allergen Reactions   Hydromorphone Nausea And Vomiting   Atorvastatin     Nausea, reflux, abdominal pain   Rosuvastatin     REACTION: n/v    Patient Measurements:   Heparin Dosing Weight: 103kg  Vital Signs: Temp: 98.8 F (37.1 C) (06/15 1058) Temp Source: Oral (06/15 1058) BP: 141/84 (06/15 1058) Pulse Rate: 79 (06/15 1058)  Labs: No results for input(s): HGB, HCT, PLT, APTT, LABPROT, INR, HEPARINUNFRC, HEPRLOWMOCWT, CREATININE, CKTOTAL, CKMB, TROPONINIHS in the last 72 hours.  CrCl cannot be calculated (Patient's most recent lab result is older than the maximum 21 days allowed.).   Medical History: Past Medical History:  Diagnosis Date   Diverticulitis     Medications:  Infusions:   Assessment: 69yoM presenting with chest pain and shortness of breath. Recently diagnosed with COVID and has been hypoxic at home. CT revealed bilateral pulmonary embolism and pleural effusion without right heart strain. Pharmacy has been consulted for IV heparin dosing.  CBC wnl, no s/sx bleeding. Pt is not taking anticoagulation PTA.   Goal of Therapy:  Heparin level 0.3-0.7 units/ml Monitor platelets by anticoagulation protocol: Yes   Plan:  Give 6,500 units bolus x 1 Start heparin infusion at 1,700 units/hr Check anti-Xa level in ~6 hours and daily while on heparin Continue to monitor H&H and platelets Follow plans for switching to PO anticoagulation  Mercy Riding, PharmD PGY1 Acute Care Pharmacy Resident Please refer to [facility] for unit-specific pharmacist

## 2020-12-27 NOTE — Progress Notes (Addendum)
ANTICOAGULATION CONSULT NOTE - Follow Up Consult  Pharmacy Consult for IV Heparin Indication: pulmonary embolus/DVT  Allergies  Allergen Reactions   Hydromorphone Nausea And Vomiting   Atorvastatin Nausea Only and Other (See Comments)    Abdominal pain and reflux, also   Hydromorphone Hcl Nausea And Vomiting   Rosuvastatin Nausea And Vomiting   Zetia [Ezetimibe] Other (See Comments)    Caused an upset stomach    Patient Measurements: Height: 6\' 2"  (188 cm) Weight: 103.4 kg (228 lb) IBW/kg (Calculated) : 82.2 Heparin Dosing Weight: 103 kg  Vital Signs: Temp: 98.8 F (37.1 C) (06/15 1058) Temp Source: Oral (06/15 1622) BP: 109/83 (06/15 1622) Pulse Rate: 73 (06/15 1400)  Labs: Recent Labs    12/27/20 1105 12/27/20 1736  HGB 13.6  --   HCT 40.6  --   PLT 227  --   HEPARINUNFRC  --  <0.10*  CREATININE 1.02  --   TROPONINIHS 4  --     Estimated Creatinine Clearance: 88.9 mL/min (by C-G formula based on SCr of 1.02 mg/dL).   Medical History: Past Medical History:  Diagnosis Date   Depression    Diverticulitis     Assessment: 70 yr old man presented with chest pain and shortness of breath. Pt was recently diagnosed with COVID and has been hypoxic at home. CT revealed bilateral pulmonary emboli and pleural effusion without right heart strain. Doppler revealed LLE occlusive thrombus. Pharmacy was consulted for IV heparin dosing. Pt was no on anticoagulant PTA.  Initial heparin level ~6 hrs after heparin 6500 units IV bolus X 1, followed by heparin infusion at 1700 units/hr, was <0.10 units/ml (undetectable). H/H 13.6/40.6, plt 227. Per RN, no issues with IV or bleeding observed.  Goal of Therapy:  Heparin level 0.3-0.7 units/ml Monitor platelets by anticoagulation protocol: Yes   Plan:  Heparin 3100 units IV bolus X 1 Increase heparin infusion to 2000 units/hr Check 6-hr heparin level Monitor daily heparin level, CBC Monitor for bleeding Follow plans for  switching to PO anticoagulant when able  Gillermina Hu, PharmD, BCPS, [facility] Clinical Pharmacist

## 2020-12-27 NOTE — H&P (Addendum)
History and Physical    Marc Lowe:[id number] DOB: 06/23/1951 DOA: 12/27/2020  PCP: Donella Stade, PA-C (Confirm with patient/family/NH records and if not entered, this has to be entered at [facility] point of entry) Patient coming from: Home  I have personally briefly reviewed patient's old medical records in [place]  Chief Complaint: SOB, back pain  HPI: Marc Lowe is a 70 y.o. male with medical history significant of HLD, diverticulitis recurrent, status post partial colectomy, hypothyroid, anxiety/depression, presented with worsening of shortness of breath, upper back pain and generalized weakness.  Patient first developed shortness of breath and fatigue 2 weeks ago and went to see PCP, who tested him for COVID which turned positive.  At that point, patient declined COVID treatment for her pulse ox being 96% on room air, and appears to have no symptoms signs of viral pneumonia.  7 days ago, patient developed cramping-like right upper quadrant cramping-like abdominal pain, her PCP diagnosed him of acute diverticulitis, he was treated for 10 days of p.o. antibiotics which today is day 7, and patient can see his abdominal pain has resolved.  However he is breathing symptoms as well as generalized weakness and fatigue persisted, and this morning, patient had a telemedicine visit with his PCP who recommended patient had CT angiogram to rule out PE.  Today also noticed patient pulse ox 86% on room air.  Compared to 96% on room air last week.  ED Course: CT angiogram showed bilateral pulmonary emboli right> left with associated atelectasis changes.  DVT study positive for left popliteal, left posterior tibial, gastric DVT.  Patient was started on heparin drip  Review of Systems: As per HPI otherwise 10 point review of systems negative.    Past Medical History:  Diagnosis Date   Depression    Diverticulitis     History reviewed. No pertinent surgical history.   reports  that he has never smoked. He has never used smokeless tobacco. He reports current alcohol use of about 3.0 standard drinks of alcohol per week. He reports that he does not use drugs.  Allergies  Allergen Reactions   Hydromorphone Nausea And Vomiting   Atorvastatin     Nausea, reflux, abdominal pain   Rosuvastatin     REACTION: n/v    Family History  Problem Relation Age of Onset   Atrial fibrillation Father      Prior to Admission medications   Medication Sig Start Date End Date Taking? Authorizing Provider  ascorbic acid (VITAMIN C) 500 MG tablet Take 500 mg by mouth daily.   Yes [provider]  B COMPLEX-C ER PO Take 1 tablet by mouth daily.   Yes [provider]  Cholecalciferol 25 MCG (1000 UT) capsule Take 1 tablet by mouth daily.   Yes [provider]  cyclobenzaprine (FLEXERIL) 10 MG tablet Take 1 tablet (10 mg total) by mouth 3 (three) times daily as needed for muscle spasms. 03/08/20  Yes Breeback, Jade L, PA-C  ezetimibe (ZETIA) 10 MG tablet Take 1 tablet (10 mg total) by mouth daily. 03/16/20  Yes Breeback, Jade L, PA-C  levothyroxine (EUTHYROX) 25 MCG tablet Take 1 tablet (25 mcg total) by mouth daily before breakfast. 08/24/20  Yes Breeback, Jade L, PA-C  magnesium gluconate (MAGONATE) 500 MG tablet Take 500 mg by mouth daily.   Yes [provider]  Sildenafil Citrate (VIAGRA PO) Take by mouth.   Yes [provider]  Turmeric 500 MG CAPS Take 1 capsule  by mouth daily.   Yes [provider]  Vilazodone HCl (VIIBRYD) 20 MG TABS Take 1 tablet (20 mg total) by mouth daily. 05/29/20  Yes Early, Coralee Pesa, NP  Zinc Sulfate (ZINC 15 PO) Take 50 mg by mouth every morning.   Yes [provider]  aspirin 81 MG EC tablet Take 81 mg by mouth daily. 06/08/16   [provider]  diclofenac sodium (VOLTAREN) 1 % GEL Apply 4 g topically 4 (four) times daily. To affected joint. 05/06/18   Breeback, Royetta Car, PA-C  hyoscyamine  (LEVSIN) 0.125 MG tablet Take 1 tablet (0.125 mg total) by mouth every 4 (four) hours as needed for cramping. Patient not taking: Reported on 12/27/2020 12/22/20   Silverio Decamp, MD    Physical Exam: Vitals:   12/27/20 1200 12/27/20 1300 12/27/20 1400 12/27/20 1622  BP: 120/78 122/81 126/79 109/83  Pulse: 71 74 73   Resp: (!) 24 (!) 22 (!) 21 (!) 24  Temp:      TempSrc:    Oral  SpO2: 93% 93% 94%   Weight:    103.4 kg  Height:    6\' 2"  (1.88 m)    Constitutional: NAD, calm, comfortable Vitals:   12/27/20 1200 12/27/20 1300 12/27/20 1400 12/27/20 1622  BP: 120/78 122/81 126/79 109/83  Pulse: 71 74 73   Resp: (!) 24 (!) 22 (!) 21 (!) 24  Temp:      TempSrc:    Oral  SpO2: 93% 93% 94%   Weight:    103.4 kg  Height:    6\' 2"  (1.88 m)   Eyes: PERRL, lids and conjunctivae normal ENMT: Mucous membranes are moist. Posterior pharynx clear of any exudate or lesions.Normal dentition.  Neck: normal, supple, no masses, no thyromegaly Respiratory: clear to auscultation bilaterally, no wheezing, no crackles. Normal respiratory effort. No accessory muscle use.  Cardiovascular: Regular rate and rhythm, no murmurs / rubs / gallops. No extremity edema. 2+ pedal pulses. No carotid bruits.  Abdomen: no tenderness, no masses palpated. No hepatosplenomegaly. Bowel sounds positive.  Musculoskeletal: no clubbing / cyanosis. No joint deformity upper and lower extremities. Good ROM, no contractures. Normal muscle tone.  Skin: no rashes, lesions, ulcers. No induration Neurologic: CN 2-12 grossly intact. Sensation intact, DTR normal. Strength 5/5 in all 4.  Psychiatric: Normal judgment and insight. Alert and oriented x 3. Normal mood.   (Anything < 9 systems with 2 bullets each down codes to level 1) (If patient refuses exam can't bill higher level) (Make sure to document decubitus ulcers present on admission -- if possible -- and whether patient has chronic indwelling catheter at time of  admission)  Labs on Admission: I have personally reviewed following labs and imaging studies  CBC: Recent Labs  Lab 12/27/20 1105  WBC 10.7*  NEUTROABS 8.6*  HGB 13.6  HCT 40.6  MCV 86.9  PLT 277   Basic Metabolic Panel: Recent Labs  Lab 12/27/20 1105  NA 131*  K 3.9  CL 97*  CO2 27  GLUCOSE 123*  BUN 14  CREATININE 1.02  CALCIUM 8.2*   GFR: Estimated Creatinine Clearance: 88.9 mL/min (by C-G formula based on SCr of 1.02 mg/dL). Liver Function Tests: Recent Labs  Lab 12/27/20 1105  AST 16  ALT 25  ALKPHOS 51  BILITOT 0.5  PROT 6.4*  ALBUMIN 3.0*   No results for input(s): LIPASE, AMYLASE in the last 168 hours. No results for input(s): AMMONIA in the last 168 hours. Coagulation  Profile: No results for input(s): INR, PROTIME in the last 168 hours. Cardiac Enzymes: No results for input(s): CKTOTAL, CKMB, CKMBINDEX, TROPONINI in the last 168 hours. BNP (last 3 results) No results for input(s): PROBNP in the last 8760 hours. HbA1C: No results for input(s): HGBA1C in the last 72 hours. CBG: No results for input(s): GLUCAP in the last 168 hours. Lipid Profile: No results for input(s): CHOL, HDL, LDLCALC, TRIG, CHOLHDL, LDLDIRECT in the last 72 hours. Thyroid Function Tests: No results for input(s): TSH, T4TOTAL, FREET4, T3FREE, THYROIDAB in the last 72 hours. Anemia Panel: No results for input(s): VITAMINB12, FOLATE, FERRITIN, TIBC, IRON, RETICCTPCT in the last 72 hours. Urine analysis: No results found for: COLORURINE, APPEARANCEUR, LABSPEC, PHURINE, GLUCOSEU, HGBUR, BILIRUBINUR, KETONESUR, PROTEINUR, UROBILINOGEN, NITRITE, LEUKOCYTESUR  Radiological Exams on Admission: CT Angio Chest W/Cm &/Or Wo Cm  Result Date: 12/27/2020 CLINICAL DATA:  Increasing shortness of breath over the past few weeks, initial encounter EXAM: CT ANGIOGRAPHY CHEST WITH CONTRAST TECHNIQUE: Multidetector CT imaging of the chest was performed using the standard protocol during bolus  administration of intravenous contrast. Multiplanar CT image reconstructions and MIPs were obtained to evaluate the vascular anatomy. CONTRAST:  17mL OMNIPAQUE IOHEXOL 350 MG/ML SOLN COMPARISON:  None. FINDINGS: Cardiovascular: Thoracic aorta and its branches demonstrate mild atherosclerotic calcification. No cardiac enlargement is seen. Pulmonary artery demonstrates multiple filling defects primarily on the right consistent with pulmonary emboli. No significant right heart strain is identified. Mild coronary calcifications are seen. Mediastinum/Nodes: Thoracic inlet is within normal limits. No sizable hilar or mediastinal adenopathy is noted. The esophagus as visualized is within normal limits. Lungs/Pleura: Small right pleural effusion is noted. Mild bibasilar atelectatic changes are seen slightly worse on the right than the left likely related to the underlying pulmonary emboli. No sizable parenchymal nodule is seen. Upper Abdomen: Visualized upper abdomen is within normal limits. Musculoskeletal: Degenerative changes of the thoracic spine are noted. Review of the MIP images confirms the above findings. IMPRESSION: Bilateral pulmonary emboli right greater than left with associated atelectatic changes in the bases as well as a small right pleural effusion. No right heart strain is seen. Aortic Atherosclerosis (ICD10-I70.0). These results will be called to the ordering clinician or representative by the Radiologist Assistant, and communication documented in the PACS or [employer]. Electronically Signed   By: Inez Catalina M.D.   On: 12/27/2020 11:00   US Venous Img Lower Bilateral  Result Date: 12/27/2020 CLINICAL DATA:  Recent diagnosis of bilateral pulmonary emboli. EXAM: BILATERAL LOWER EXTREMITY VENOUS DOPPLER ULTRASOUND TECHNIQUE: Gray-scale sonography with compression, as well as color and duplex ultrasound, were performed to evaluate the deep venous system(s) from the level of the common femoral  vein through the popliteal and proximal calf veins. COMPARISON:  07/02/2019 FINDINGS: VENOUS On the right, normal compressibility of the common femoral, superficial femoral, and popliteal veins, as well as the visualized calf veins. Visualized portions of profunda femoral vein and great saphenous vein unremarkable. No filling defects to suggest DVT on grayscale or color Doppler imaging. Doppler waveforms show normal direction of venous flow, normal respiratory phasicity and response to augmentation. On the left, normal compressibility of the common femoral, and femoral vein. Saphenofemoral junction patent. There is hypoechoic noncompressible thrombus in the popliteal vein with a small amount of persistent antegrade signal on Doppler and color Doppler. There is partially occlusive thrombus in the gastric vein, and occlusive thrombus in the posterior tibial vein. OTHER None. Limitations: none IMPRESSION: 1. POSITIVE for left posterior tibial,  gastric, and popliteal DVT. 2. Negative for right lower extremity DVT. Electronically Signed   By: Lucrezia Europe M.D.   On: 12/27/2020 13:41    EKG: Independently reviewed.  Sinus, right axis deviation  Assessment/Plan Active Problems:   Pulmonary embolus (HCC)   Pulmonary emboli (HCC)  (please populate well all problems here in Problem List. (For example, if patient is on BP meds at home and you resume or decide to hold them, it is a problem that needs to be her. Same for CAD, COPD, HLD and so on)   PE amd DVT -Acute, appears to be provoked from COVID-19 infection. -Plan to continue heparin drip for today, switch to p.o. anticoagulation after echocardiogram. -Discussed with patient regarding follow-up with hematologist for hypercoagulable state work-up as outpatient.  Patient expressed understanding and agreed.  Acute hypoxic respite failure -Likely secondary to PE, resolved.  Continue heparin drip for now, start incentive spirometry.  COVID 19  infection -Diagnosed >2 weeks ago, most of his respiratory symptoms including SOB and hypoxia can be explained by acute PE. CTA chest showed no signs viral PNA. No indication for COVID PNA treatment. Patient agreed.  Diverticulitis -Symptoms resolved, changed to p.o. Augmentin for another 3 days to complete 10 days course.  Recommend outpatient follow-up with GI for colonoscopy in 4 weeks.  Hypothyroidism -Stable, continue home dose of Synthroid  Anxiety/depression -SSRI.  HLD -Continue Zetia  Mild hyponatremia -Euvolemic, probably related to PE.  DVT prophylaxis: Heparin drip Code Status: Full code Family Communication: None at bedside Disposition Plan: Expect less than 2 midnight hospital stay Consults called: None Admission status: MedSurg observation   Lequita Halt MD [facility] Pager [phone]  12/27/2020, 6:11 PM

## 2020-12-27 NOTE — Progress Notes (Signed)
Patient arrived to unit via [facility]link. Ambulated to bed with SBA. He is a&O x4, VSS. Patient placed on telemetry, NSR. He has one IV site to right AC, and has heparin infusing at 1700 units/hr. He denies any pain at this time. Breathing is slightly tachypneic, although not labored. He is able to make needs known. Education provided to call light, room, bed buttons. He denies any further needs at this time. Will continue to monitor. Call light and possessions in reach.

## 2020-12-27 NOTE — ED Provider Notes (Signed)
I personally evaluated the patient during the encounter and completed a history, physical, procedures, medical decision making to contribute to the overall care of the patient and decision making for the patient briefly, the patient is a 70 y.o. male history of diverticulitis who presents the ED with chest pain shortness of breath.  Diagnosed with COVID 2 weeks ago.  Was actually at outpatient radiology next-door for PE study.  Patient was getting hypoxic at home.  Was hypoxic in radiology.  Review of the CT scan appears to show blood clot.  Suspect that the wheeze some mild heart strain as this appears to be in a big branch of the pulmonary artery.  Will wait for radiology report but will start IV heparin and check basic screening labs and admit to medicine given clot burden and hypoxia.  May need to discuss with pulmonology team if there is evidence of right heart strain may need ED to ED transfer for stat echocardiogram.  See my PA note for further results, evaluation, disposition of the patient.  He was recently diagnosed with diverticulitis but abdominal pain has improved.  No concern for ongoing intra-abdominal issue.  EKG shows sinus rhythm.  No ischemic changes.  This chart was dictated using voice recognition software.  Despite best efforts to proofread,  errors can occur which can change the documentation meaning.    EKG Interpretation  Date/Time:  Wednesday December 27 2020 10:57:01 EDT Ventricular Rate:  78 PR Interval:  153 QRS Duration: 91 QT Interval:  363 QTC Calculation: 414 R Axis:   16 Text Interpretation: Sinus rhythm Abnormal R-wave progression, early transition Inferior infarct, old Confirmed by Ronnald Nian, Celestine Bougie [phone]) on 12/27/2020 11:03:51 AM             Lennice Sites, DO 12/27/20 1104

## 2020-12-27 NOTE — ED Provider Notes (Addendum)
[facility] EMERGENCY DEPARTMENT Provider Note   CSN: [id number] Arrival date & time: 12/27/20  1049     History Chief Complaint  Patient presents with   Chest Pain    Marc Lowe is a 70 y.o. male.  Patient presents the emergency department today for chest pain and shortness of breath.  Patient had a presumed COVID-19 infection 2 weeks ago after an exposure.  Patient states that he felt very weak and tired but did not have significant respiratory symptoms.  He recovered from this but shortly after developed severe pain in his left lower quadrant consistent with previous diverticulitis.  Patient was given a course of antibiotics which seemed to improve his symptoms very quickly.  Over the past 2 days however he has had some pain, mainly in the right back, which progressed to worsening pain and shortness of breath this morning.  This prompted video visit with his primary care provider.  He had oxygen saturations into the 80s at rest today and low 80s with walking.  He was sent for an outpatient CT scan to evaluate for pulmonary embolism.  This was positive and patient was transferred to the emergency department.  Patient reports recent immobility due to various illnesses.  He states that he has had spent the majority of the day in bed.  Otherwise he denies recent travel, history of blood clots, hormone use.  He denies swelling or tenderness in his calves or thighs.  No known history of cancer.      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   Seborrheic keratoses 02/21/2020   Cervical radiculopathy 02/18/2020    DDD (degenerative disc disease), cervical 02/18/2020   Mixed hyperlipidemia 02/18/2020   Memory changes 02/18/2020   Anhedonia 05/13/2017   Abnormal weight gain 05/13/2017   Elevated TSH 12/18/2016   No energy 12/17/2016   Toenail fungus 12/16/2016   Left shoulder pain 11/21/2016   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   Family history of atrial fibrillation 06/11/2016   Left lower quadrant abdominal pain 05/08/2016   History of diverticulitis 05/08/2016   Cervical nerve root impingement 04/11/2015   Hyperlipidemia 04/15/2014   History of bowel resection 04/14/2014   Testosterone deficiency 04/14/2014   DIARRHEA 03/14/2010   ABDOMINAL PAIN, GENERALIZED 03/14/2010   VARICES OF OTHER SITES 10/04/2009   IMPAIRED FASTING GLUCOSE 07/24/2009   PARONYCHIA, FINGER 07/13/2009   SKIN RASH 07/13/2009   ELEVATED PROSTATE SPECIFIC ANTIGEN 04/10/2009   CELLULITIS AND ABSCESS OF OTHER SPECIFIED SITE 05/09/2008   CHRONIC RHINITIS 06/04/2007   CONDYLOMA ACUMINATA 03/26/2007   HYPOGONADISM, MALE 11/04/2006   Depression, recurrent ([place]) 11/04/2006   ACNE ROSACEA 10/07/2006   LIBIDO, DECREASED 10/07/2006    History reviewed. No pertinent surgical history.     Family History  Problem Relation Age of Onset   Atrial fibrillation Father     Social History   Tobacco Use   Smoking status: Never   Smokeless tobacco: Never  Vaping Use   Vaping Use: Never used  Substance Use Topics   Alcohol use: Yes    Alcohol/week: 3.0 standard drinks  Types: 3 Glasses of wine per week    Comment: 4 ounces every couple of days   Drug use: No    Home Medications Prior to Admission medications   Medication Sig Start Date End Date Taking? Authorizing Provider  ascorbic acid (VITAMIN C) 500 MG tablet Take 500 mg by mouth daily.    [provider]  aspirin 81 MG EC tablet Take 81 mg by mouth daily. 06/08/16   [provider]  B COMPLEX-C ER PO Take 1 tablet by mouth daily.     [provider]  Cholecalciferol 25 MCG (1000 UT) capsule Take 1 tablet by mouth daily.    [provider]  cyclobenzaprine (FLEXERIL) 10 MG tablet Take 1 tablet (10 mg total) by mouth 3 (three) times daily as needed for muscle spasms. 03/08/20   Breeback, Luvenia Starch L, PA-C  diclofenac sodium (VOLTAREN) 1 % GEL Apply 4 g topically 4 (four) times daily. To affected joint. 05/06/18   Breeback, Royetta Car, PA-C  ezetimibe (ZETIA) 10 MG tablet Take 1 tablet (10 mg total) by mouth daily. 03/16/20   Breeback, Royetta Car, PA-C  hyoscyamine (LEVSIN) 0.125 MG tablet Take 1 tablet (0.125 mg total) by mouth every 4 (four) hours as needed for cramping. Patient not taking: Reported on 12/27/2020 12/22/20   Silverio Decamp, MD  levothyroxine (EUTHYROX) 25 MCG tablet Take 1 tablet (25 mcg total) by mouth daily before breakfast. 08/24/20   Breeback, Jade L, PA-C  magnesium gluconate (MAGONATE) 500 MG tablet Take 500 mg by mouth daily.    [provider]  Sildenafil Citrate (VIAGRA PO) Take by mouth.    [provider]  Turmeric 500 MG CAPS Take 1 capsule by mouth daily.    [provider]  Vilazodone HCl (VIIBRYD) 20 MG TABS Take 1 tablet (20 mg total) by mouth daily. 05/29/20   Orma Render, NP  Zinc Sulfate (ZINC 15 PO) Take 50 mg by mouth every morning.    [provider]    Allergies    Hydromorphone, Atorvastatin, and Rosuvastatin  Review of Systems   Review of Systems  Constitutional:  Negative for fever.  HENT:  Negative for rhinorrhea and sore throat.   Eyes:  Negative for redness.  Respiratory:  Positive for shortness of breath. Negative for cough.   Cardiovascular:  Negative for chest pain.  Gastrointestinal:  Negative for abdominal pain, diarrhea, nausea and vomiting.  Genitourinary:  Negative for dysuria and hematuria.  Musculoskeletal:  Positive for back pain. Negative for myalgias.  Skin:  Negative for rash.  Neurological:  Negative for headaches.    Physical Exam Updated Vital Signs BP (!) 141/84 (BP Location: Right Arm)   Pulse 79   Temp 98.8 F (37.1 C) (Oral)   Resp (!) 22   SpO2 97%   Physical Exam Vitals and nursing note reviewed.  Constitutional:      General: He is not in acute distress.    Appearance: He is well-developed.  HENT:     Head: Normocephalic and atraumatic.  Eyes:     General:        Right eye: No discharge.        Left eye: No discharge.     Conjunctiva/sclera: Conjunctivae normal.  Cardiovascular:     Rate and Rhythm: Normal rate and regular rhythm.     Heart sounds: Normal heart sounds.  Pulmonary:     Effort: Pulmonary effort is normal. Tachypnea present.     Breath  sounds: Normal breath sounds.     Comments: Mild tachypnea Abdominal:     Palpations: Abdomen is soft.     Tenderness: There is no abdominal tenderness.  Musculoskeletal:     Cervical back: Normal range of motion and neck supple.     Right lower leg: No tenderness. No edema.     Left lower leg: No tenderness. No edema.  Skin:    General: Skin is warm and dry.  Neurological:     Mental Status: He is alert.    ED Results / Procedures / Treatments   Labs (all labs ordered are listed, but only abnormal results are displayed) Labs Reviewed  CBC WITH DIFFERENTIAL/PLATELET - Abnormal; Notable for the following components:      Result Value   WBC 10.7 (*)    Neutro Abs 8.6 (*)    Monocytes Absolute 1.2 (*)    All other components within normal limits  RESP PANEL BY RT-PCR (FLU A&B, COVID) [id number]  COMPREHENSIVE METABOLIC PANEL  TROPONIN I (HIGH SENSITIVITY)    EKG EKG Interpretation  Date/Time:  Wednesday December 27 2020 10:57:01 EDT Ventricular Rate:  78 PR Interval:  153 QRS Duration: 91 QT Interval:  363 QTC Calculation: 414 R Axis:   16 Text Interpretation: Sinus rhythm Abnormal R-wave progression, early transition Inferior infarct, old Confirmed by Ronnald Nian, Adam ([id number]) on 12/27/2020 11:03:51 AM  Radiology CT  Angio Chest W/Cm &/Or Wo Cm  Result Date: 12/27/2020 CLINICAL DATA:  Increasing shortness of breath over the past few weeks, initial encounter EXAM: CT ANGIOGRAPHY CHEST WITH CONTRAST TECHNIQUE: Multidetector CT imaging of the chest was performed using the standard protocol during bolus administration of intravenous contrast. Multiplanar CT image reconstructions and MIPs were obtained to evaluate the vascular anatomy. CONTRAST:  11mL OMNIPAQUE IOHEXOL 350 MG/ML SOLN COMPARISON:  None. FINDINGS: Cardiovascular: Thoracic aorta and its branches demonstrate mild atherosclerotic calcification. No cardiac enlargement is seen. Pulmonary artery demonstrates multiple filling defects primarily on the right consistent with pulmonary emboli. No significant right heart strain is identified. Mild coronary calcifications are seen. Mediastinum/Nodes: Thoracic inlet is within normal limits. No sizable hilar or mediastinal adenopathy is noted. The esophagus as visualized is within normal limits. Lungs/Pleura: Small right pleural effusion is noted. Mild bibasilar atelectatic changes are seen slightly worse on the right than the left likely related to the underlying pulmonary emboli. No sizable parenchymal nodule is seen. Upper Abdomen: Visualized upper abdomen is within normal limits. Musculoskeletal: Degenerative changes of the thoracic spine are noted. Review of the MIP images confirms the above findings. IMPRESSION: Bilateral pulmonary emboli right greater than left with associated atelectatic changes in the bases as well as a small right pleural effusion. No right heart strain is seen. Aortic Atherosclerosis (ICD10-I70.0). These results will be called to the ordering clinician or representative by the Radiologist Assistant, and communication documented in the PACS or [employer]. Electronically Signed   By: Inez Catalina M.D.   On: 12/27/2020 11:00    Procedures Procedures   Medications Ordered in ED Medications - No  data to display  ED Course  I have reviewed the triage vital signs and the nursing notes.  Pertinent labs & imaging results that were available during my care of the patient were reviewed by me and considered in my medical decision making (see chart for details).  Patient seen and examined. Work-up initiated. Medications ordered.   Vital signs reviewed and are as follows: BP (!) 141/84 (BP Location: Right Arm)  Pulse 79   Temp 98.8 F (37.1 C) (Oral)   Resp (!) 22   SpO2 97%   Patient discussed with and seen by Dr. Ronnald Nian.  Plan for IV heparin.  Patient with intermediate risk PESI score.  Plan to admit to the hospital when work-up complete.   CT result:  IMPRESSION: Bilateral pulmonary emboli right greater than left with associated atelectatic changes in the bases as well as a small right pleural effusion. No right heart strain is seen.  Aortic Atherosclerosis (ICD10-I70.0).  12:22 PM Pt resting comfortably.  When I reexamined him, pulse ox 92% at rest.  I spoke with Dr. Fuller Plan regarding admission.   Bilateral LE doppler US ordered.   CRITICAL CARE Performed by: Carlisle Cater PA-C Total critical care time: 40 minutes Critical care time was exclusive of separately billable procedures and treating other patients. Critical care was necessary to treat or prevent imminent or life-threatening deterioration. Critical care was time spent personally by me on the following activities: development of treatment plan with patient and/or surrogate as well as nursing, discussions with consultants, evaluation of patient's response to treatment, examination of patient, obtaining history from patient or surrogate, ordering and performing treatments and interventions, ordering and review of laboratory studies, ordering and review of radiographic studies, pulse oximetry and re-evaluation of patient's condition.  2:28 PM patient updated on finding of left lower extremity DVT and positive  COVID test.  I suspect that he has recovered from his COVID infection given history.     MDM Rules/Calculators/A&P                          Admit.    Final Clinical Impression(s) / ED Diagnoses Final diagnoses:  Bilateral pulmonary embolism [facility])    Rx / DC Orders ED Discharge Orders     None        Carlisle Cater, PA-C 12/27/20 [place], Elmo, DO 12/27/20 [place]    Carlisle Cater, PA-C 12/27/20 [place], Calcium, DO 12/27/20 1452

## 2020-12-27 NOTE — ED Triage Notes (Signed)
Pt from CT for out-pt chest Ct exam, chest pain . Confirmes PE  today. Covid + on 12/13/2020

## 2020-12-28 ENCOUNTER — Observation Stay (HOSPITAL_COMMUNITY): Payer: Medicare Other

## 2020-12-28 DIAGNOSIS — E871 Hypo-osmolality and hyponatremia: Secondary | ICD-10-CM | POA: Diagnosis present

## 2020-12-28 DIAGNOSIS — K5792 Diverticulitis of intestine, part unspecified, without perforation or abscess without bleeding: Secondary | ICD-10-CM | POA: Diagnosis present

## 2020-12-28 DIAGNOSIS — Z79899 Other long term (current) drug therapy: Secondary | ICD-10-CM | POA: Diagnosis not present

## 2020-12-28 DIAGNOSIS — F32A Depression, unspecified: Secondary | ICD-10-CM | POA: Diagnosis present

## 2020-12-28 DIAGNOSIS — Z8616 Personal history of COVID-19: Secondary | ICD-10-CM | POA: Diagnosis not present

## 2020-12-28 DIAGNOSIS — E785 Hyperlipidemia, unspecified: Secondary | ICD-10-CM | POA: Diagnosis present

## 2020-12-28 DIAGNOSIS — I2602 Saddle embolus of pulmonary artery with acute cor pulmonale: Secondary | ICD-10-CM

## 2020-12-28 DIAGNOSIS — J9601 Acute respiratory failure with hypoxia: Secondary | ICD-10-CM | POA: Diagnosis present

## 2020-12-28 DIAGNOSIS — J9811 Atelectasis: Secondary | ICD-10-CM | POA: Diagnosis present

## 2020-12-28 DIAGNOSIS — Z888 Allergy status to other drugs, medicaments and biological substances status: Secondary | ICD-10-CM | POA: Diagnosis not present

## 2020-12-28 DIAGNOSIS — I824Z2 Acute embolism and thrombosis of unspecified deep veins of left distal lower extremity: Secondary | ICD-10-CM | POA: Diagnosis not present

## 2020-12-28 DIAGNOSIS — I82442 Acute embolism and thrombosis of left tibial vein: Secondary | ICD-10-CM | POA: Diagnosis present

## 2020-12-28 DIAGNOSIS — F419 Anxiety disorder, unspecified: Secondary | ICD-10-CM | POA: Diagnosis present

## 2020-12-28 DIAGNOSIS — E039 Hypothyroidism, unspecified: Secondary | ICD-10-CM | POA: Diagnosis present

## 2020-12-28 DIAGNOSIS — I2699 Other pulmonary embolism without acute cor pulmonale: Secondary | ICD-10-CM | POA: Diagnosis present

## 2020-12-28 DIAGNOSIS — M5030 Other cervical disc degeneration, unspecified cervical region: Secondary | ICD-10-CM | POA: Diagnosis present

## 2020-12-28 DIAGNOSIS — Z7982 Long term (current) use of aspirin: Secondary | ICD-10-CM | POA: Diagnosis not present

## 2020-12-28 DIAGNOSIS — Z20822 Contact with and (suspected) exposure to covid-19: Secondary | ICD-10-CM | POA: Diagnosis present

## 2020-12-28 DIAGNOSIS — I82432 Acute embolism and thrombosis of left popliteal vein: Secondary | ICD-10-CM | POA: Diagnosis present

## 2020-12-28 DIAGNOSIS — Z9049 Acquired absence of other specified parts of digestive tract: Secondary | ICD-10-CM | POA: Diagnosis not present

## 2020-12-28 LAB — CBC
HCT: 39.3 % (ref 39.0–52.0)
Hemoglobin: 13.1 g/dL (ref 13.0–17.0)
MCH: 29.2 pg (ref 26.0–34.0)
MCHC: 33.3 g/dL (ref 30.0–36.0)
MCV: 87.7 fL (ref 80.0–100.0)
Platelets: 212 10*3/uL (ref 150–400)
RBC: 4.48 MIL/uL (ref 4.22–5.81)
RDW: 13.2 % (ref 11.5–15.5)
WBC: 8.8 10*3/uL (ref 4.0–10.5)
nRBC: 0 % (ref 0.0–0.2)

## 2020-12-28 LAB — HEPARIN LEVEL (UNFRACTIONATED)
Heparin Unfractionated: 0.14 IU/mL — ABNORMAL LOW (ref 0.30–0.70)
Heparin Unfractionated: 0.31 IU/mL (ref 0.30–0.70)
Heparin Unfractionated: 0.32 IU/mL (ref 0.30–0.70)

## 2020-12-28 LAB — ECHOCARDIOGRAM COMPLETE
Area-P 1/2: 4.46 cm2
Height: 74 in
S' Lateral: 2.7 cm
Weight: 3648 oz

## 2020-12-28 LAB — HIV ANTIBODY (ROUTINE TESTING W REFLEX): HIV Screen 4th Generation wRfx: NONREACTIVE

## 2020-12-28 MED ORDER — HEPARIN BOLUS VIA INFUSION
3000.0000 [IU] | Freq: Once | INTRAVENOUS | Status: AC
  Administered 2020-12-28: 3000 [IU] via INTRAVENOUS
  Filled 2020-12-28: qty 3000

## 2020-12-28 MED ORDER — ACETAMINOPHEN 500 MG PO TABS
1000.0000 mg | ORAL_TABLET | Freq: Three times a day (TID) | ORAL | Status: DC
  Administered 2020-12-28 – 2020-12-29 (×3): 1000 mg via ORAL
  Filled 2020-12-28 (×3): qty 2

## 2020-12-28 NOTE — Progress Notes (Signed)
ANTICOAGULATION CONSULT NOTE - Follow Up Consult  Pharmacy Consult for Heparin Indication: bilateral pulmonary embolus and DVT LLE  Allergies  Allergen Reactions   Nsaids Other (See Comments)    SHOULD NOT HAVE THIS CLASS OF MEDICATION- HISTORY OF DIVERTICULITIS   Hydromorphone Nausea And Vomiting   Atorvastatin Nausea Only and Other (See Comments)    Abdominal pain and reflux, also   Hydromorphone Hcl Nausea And Vomiting   Rosuvastatin Nausea And Vomiting   Zetia [Ezetimibe] Nausea Only and Other (See Comments)    Caused an upset stomach and made patient's body hurt    Patient Measurements: Height: 6\' 2"  (188 cm) Weight: 103.4 kg (228 lb) IBW/kg (Calculated) : 82.2 Heparin Dosing Weight: 100 kg  Vital Signs: Temp: 97.8 F (36.6 C) (06/16 0507) Temp Source: Oral (06/16 0507) BP: 114/72 (06/16 0507) Pulse Rate: 73 (06/16 0507)  Labs: Recent Labs    12/27/20 1105 12/27/20 1736 12/28/20 0127 12/28/20 1006  HGB 13.6  --  13.1  --   HCT 40.6  --  39.3  --   PLT 227  --  212  --   HEPARINUNFRC  --  <0.10* 0.14* 0.31  CREATININE 1.02  --   --   --   TROPONINIHS 4  --   --   --     Estimated Creatinine Clearance: 88.9 mL/min (by C-G formula based on SCr of 1.02 mg/dL).  Assessment: 70 yr old man presented with chest pain and shortness of breath. Pt was recently diagnosed with COVID and has been hypoxic at home. CT revealed bilateral pulmonary emboli and pleural effusion without right heart strain. Doppler revealed LLE occlusive thrombus. Pharmacy was consulted for IV heparin dosing. Pt was no on anticoagulant PTA.   Heparin level is now low therapeutic (0.31) on 2350 units/hr, after subtherapeutic x 2 and re-bolused and infusion rate increased.  Planning switch to Eliquis later today.   Goal of Therapy:  Heparin level 0.3-0.7 units/ml Monitor platelets by anticoagulation protocol: Yes   Plan:   Increase heparin drip to 2450 units/hr to try to keep at goal.  No  repeat level, as expecting transition to Eliquis later today.  Arty Baumgartner, RPh 12/28/2020,11:20 AM

## 2020-12-28 NOTE — TOC Initial Note (Addendum)
Transition of Care [facility]) - Initial/Assessment Note    Patient Details  Name: Marc Lowe MRN: [id number] Date of Birth: 1951/02/15  Transition of Care [facility]) CM/SW Contact:    Verdell Carmine, RN Phone Number: 12/28/2020, 9:27 AM  Clinical Narrative:                 70 yo patient with a positive COVID test 2 weeks ago then developed diverticulatitis, now with worsening SHOB and decreased saturations in the mid 80's CT scan reveals PE. On anticoagulant( heparin IV)  will need DOAC at home.  Lives with wife was progressively getting weaker with illness. Will likely need Home Health for reconditioning, may require oxygen at home CM will follow for needs.   Eligibility sent for DOAC  1600: DOAC eligibility- [place] 165/ month eliquis not covered. Order accordingly  Expected Discharge Plan: [place] Barriers to Discharge: Continued Medical Work up   Patient Goals and CMS Choice        Expected Discharge Plan and Services Expected Discharge Plan: [place]       Living arrangements for the past 2 months: Single Family Home                                      Prior Living Arrangements/Services Living arrangements for the past 2 months: Single Family Home Lives with:: Spouse Patient language and need for interpreter reviewed:: Yes        Need for Family Participation in Patient Care: Yes (Comment) Care giver support system in place?: Yes (comment)   Criminal Activity/Legal Involvement Pertinent to Current Situation/Hospitalization: No - Comment as needed  Activities of Daily Living Home Assistive Devices/Equipment: None ADL Screening (condition at time of admission) Patient's cognitive ability adequate to safely complete daily activities?: Yes Is the patient deaf or have difficulty hearing?: No Does the patient have difficulty seeing, even when wearing glasses/contacts?: No Does the patient have difficulty concentrating,  remembering, or making decisions?: No Patient able to express need for assistance with ADLs?: Yes Does the patient have difficulty dressing or bathing?: No Independently performs ADLs?: Yes (appropriate for developmental age) Does the patient have difficulty walking or climbing stairs?: No Weakness of Legs: None Weakness of Arms/Hands: None  Permission Sought/Granted                  Emotional Assessment       Orientation: : Oriented to Self, Oriented to Place, Oriented to  Time, Oriented to Situation Alcohol / Substance Use: Not Applicable Psych Involvement: No (comment)  Admission diagnosis:  Pulmonary embolus (HCC) [I26.99] Bilateral pulmonary embolism (HCC) [I26.99] Acute deep vein thrombosis (DVT) of distal vein of left lower extremity (HCC) [I82.4Z2] Pulmonary emboli (HCC) [I26.99] Patient Active Problem List   Diagnosis Date Noted   Acute respiratory failure with hypoxia ([place]) 12/27/2020   Intercostal pain 12/27/2020   Mid back pain on right side 12/27/2020   SOB (shortness of breath) 12/27/2020   COVID-19 virus infection 12/27/2020   Pulmonary embolus ([place]) 12/27/2020   Bilateral pulmonary embolism ([place]) 12/27/2020   Need for zoster vaccination 05/29/2020   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   Seborrheic keratoses 02/21/2020   Cervical radiculopathy 02/18/2020   DDD (degenerative disc disease), cervical 02/18/2020   Mixed hyperlipidemia 02/18/2020   Memory changes 02/18/2020   Anhedonia 05/13/2017   Abnormal weight gain  05/13/2017   Elevated TSH 12/18/2016   No energy 12/17/2016   Toenail fungus 12/16/2016   Left shoulder pain 11/21/2016   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   Family history of atrial fibrillation 06/11/2016   Left lower quadrant abdominal pain 05/08/2016   History of diverticulitis 05/08/2016   Cervical nerve root impingement 04/11/2015   Hyperlipidemia 04/15/2014   History of bowel resection 04/14/2014   Testosterone  deficiency 04/14/2014   DIARRHEA 03/14/2010   ABDOMINAL PAIN, GENERALIZED 03/14/2010   VARICES OF OTHER SITES 10/04/2009   IMPAIRED FASTING GLUCOSE 07/24/2009   PARONYCHIA, FINGER 07/13/2009   SKIN RASH 07/13/2009   ELEVATED PROSTATE SPECIFIC ANTIGEN 04/10/2009   CELLULITIS AND ABSCESS OF OTHER SPECIFIED SITE 05/09/2008   CHRONIC RHINITIS 06/04/2007   CONDYLOMA ACUMINATA 03/26/2007   HYPOGONADISM, MALE 11/04/2006   Depression, recurrent ([place]) 11/04/2006   ACNE ROSACEA 10/07/2006   LIBIDO, DECREASED 10/07/2006   PCP:  Lavada Mesi Pharmacy:   [facility] [address], [place] [address] Phone: [phone] Fax: [phone]     Social Determinants of Health ([place]) Interventions    Readmission Risk Interventions No flowsheet data found.

## 2020-12-28 NOTE — Progress Notes (Signed)
  Echocardiogram 2D Echocardiogram has been performed.  Marc Lowe 12/28/2020, 2:34 PM

## 2020-12-28 NOTE — TOC Benefit Eligibility Note (Signed)
Transition of Care [facility]) Benefit Eligibility Note    Patient Details  Name: Marc Lowe MRN: [id number] Date of Birth: Aug 30, 1950   Medication/Dose: Alveda Reasons 10 MG BID  Covered?: Yes  Tier: 3 Drug  Prescription Coverage Preferred Pharmacy: [facility] [place] with Person/Company/Phone Number:: [employer]  @ [employer] S[employer] # [phone]  Co-Pay: $165.91  Prior Approval: No  Deductible: Unmet (OUT-OF-POCKET: UNMET)  Additional Notes: ELIQUIS  5 MG BID : NOT COVER   P/A-YES # [phone]    Memory Argue Phone Number: 12/28/2020, 1:31 PM

## 2020-12-28 NOTE — Progress Notes (Signed)
ANTICOAGULATION CONSULT NOTE  Pharmacy Consult for Heparin Indication: pulmonary embolus/DVT  Allergies  Allergen Reactions   Nsaids Other (See Comments)    SHOULD NOT HAVE THIS CLASS OF MEDICATION- HISTORY OF DIVERTICULITIS   Hydromorphone Nausea And Vomiting   Atorvastatin Nausea Only and Other (See Comments)    Abdominal pain and reflux, also   Hydromorphone Hcl Nausea And Vomiting   Rosuvastatin Nausea And Vomiting   Zetia [Ezetimibe] Nausea Only and Other (See Comments)    Caused an upset stomach and made patient's body hurt    Patient Measurements: Height: 6\' 2"  (188 cm) Weight: 103.4 kg (228 lb) IBW/kg (Calculated) : 82.2 Heparin Dosing Weight: 100 kg  Vital Signs: Temp: 97.6 F (36.4 C) (06/15 2046) Temp Source: Oral (06/15 2046) BP: 116/64 (06/15 2046) Pulse Rate: 82 (06/15 2046)  Labs: Recent Labs    12/27/20 1105 12/27/20 1736 12/28/20 0127  HGB 13.6  --  13.1  HCT 40.6  --  39.3  PLT 227  --  212  HEPARINUNFRC  --  <0.10* 0.14*  CREATININE 1.02  --   --   TROPONINIHS 4  --   --      Estimated Creatinine Clearance: 88.9 mL/min (by C-G formula based on SCr of 1.02 mg/dL).   Assessment: 70 y.o. male with PE/DVT for heparin  Goal of Therapy:  Heparin level 0.3-0.7 units/ml Monitor platelets by anticoagulation protocol: Yes   Plan:  Heparin 3000 units IV bolus, then increase heparin 2350 units/hr Check heparin level in 6 hours.   Phillis Knack, PharmD, BCPS

## 2020-12-28 NOTE — Progress Notes (Signed)
ANTICOAGULATION CONSULT NOTE - Follow Up Consult  Pharmacy Consult for Heparin Indication: bilateral pulmonary embolus and DVT LLE  Allergies  Allergen Reactions   Nsaids Other (See Comments)    SHOULD NOT HAVE THIS CLASS OF MEDICATION- HISTORY OF DIVERTICULITIS   Hydromorphone Nausea And Vomiting   Atorvastatin Nausea Only and Other (See Comments)    Abdominal pain and reflux, also   Hydromorphone Hcl Nausea And Vomiting   Rosuvastatin Nausea And Vomiting   Zetia [Ezetimibe] Nausea Only and Other (See Comments)    Caused an upset stomach and made patient's body hurt    Patient Measurements: Height: 6\' 2"  (188 cm) Weight: 103.4 kg (228 lb) IBW/kg (Calculated) : 82.2 Heparin Dosing Weight: 100 kg  Vital Signs: Temp: 98.5 F (36.9 C) (06/16 1333) Temp Source: Oral (06/16 1333) BP: 109/75 (06/16 1333) Pulse Rate: 64 (06/16 1333)  Labs: Recent Labs    12/27/20 1105 12/27/20 1736 12/28/20 0127 12/28/20 1006 12/28/20 1915  HGB 13.6  --  13.1  --   --   HCT 40.6  --  39.3  --   --   PLT 227  --  212  --   --   HEPARINUNFRC  --    < > 0.14* 0.31 0.32  CREATININE 1.02  --   --   --   --   TROPONINIHS 4  --   --   --   --    < > = values in this interval not displayed.     Estimated Creatinine Clearance: 88.9 mL/min (by C-G formula based on SCr of 1.02 mg/dL).  Assessment: 70 yr old man presented with chest pain and shortness of breath. Pt was recently diagnosed with COVID and has been hypoxic at home. CT revealed bilateral pulmonary emboli and pleural effusion without right heart strain. Doppler revealed LLE occlusive thrombus. Pharmacy was consulted for IV heparin dosing. Pt was no on anticoagulant PTA.  Heparin level of 0.32 on heparin 2450 units/hr is therapeutic.    Goal of Therapy:  Heparin level 0.3-0.7 units/ml Monitor platelets by anticoagulation protocol: Yes   Plan:  Increase heparin to 2500 units/hr to ensure remains therapeutic.  Follow up AM heparin  level and transition to oral anticoagulant   Cristela Felt, PharmD Clinical Pharmacist  12/28/2020,9:14 PM

## 2020-12-28 NOTE — Progress Notes (Addendum)
PROGRESS NOTE        PATIENT DETAILS Name: Marc Lowe Age: 70 y.o. Sex: male Date of Birth: September 28, 1950 Admit Date: 12/27/2020 Admitting Physician Lequita Halt, MD VOZ:[id number], Royetta Car, PA-C  Brief Narrative: Patient is a 70 y.o. male HLD, hypothyroidism, anxiety/depression, recent COVID-8 infection-who presented with pleuritic right upper back pain-upon further work-up he was found to have PE and left lower extremity DVT.  Significant events: 6/15>> admitted for PE/LLE DVT.  Significant studies: 6/15>> CTA chest: Bilateral pulmonary emboli-right>> left 6/15>> bilateral lower extremity Doppler: Left DVT-posterior tibial, gastric, popliteal vein.  Antimicrobial therapy: None  Microbiology data: 6/15>> COVID PCR: Positive  Procedures : None  Consults: None  DVT Prophylaxis :   IV heparin   Subjective: Continues to have-right upper back pain-very pleuritic.   Assessment/Plan: Pulmonary embolism-left lower extremity DVT: Provoked by recent COVID-19 infection-continues to have significant pleuritic chest pain-hemodynamically stable-no longer on oxygen-remains on IV heparin.  We will try scheduled Tylenol to see if this alleviates pain, avoid NSAIDs given the fact that he is on anticoagulation.  Await echo-if continues to tolerate IV heparin well-we will switch to Eliquis later today.  Recent COVID-19 infection: Per H&P-this was more than 2 weeks back-does not require isolation.  He does not have any symptoms related to COVID-19.  Recent history of diverticulitis: Continue Augmentin to complete a 10-day course.  Hypothyroidism: Continue Synthroid  HLD: Continue Zetia  Hyponatremia: Very mild stable for monitoring  Anxiety/depression: Stable-continue Viibryd.  Diet: Diet Order             Diet Heart Room service appropriate? Yes; Fluid consistency: Thin  Diet effective now                    Code Status: Full code    Family Communication: Patient prefers to update spouse himself-I have asked him to let me know if his spouse is any questions for me.  She apparently is sick at home with COVID.  Disposition Plan: Status is: Observation  The patient will require care spanning > 2 midnights and should be moved to inpatient because: Inpatient level of care appropriate due to severity of illness  Dispo: The patient is from: Home              Anticipated d/c is to: Home              Patient currently is not medically stable to d/c.   Difficult to place patient No   Barriers to Discharge: Await echo-ambulate-optimize pleuritic pain control-if tolerates oral anticoagulation overnight-potential discharge tomorrow morning.  Antimicrobial agents: Anti-infectives (From admission, onward)    Start     Dose/Rate Route Frequency Ordered Stop   12/27/20 2200  amoxicillin-clavulanate (AUGMENTIN) 875-125 MG per tablet 1 tablet        1 tablet Oral Every 12 hours 12/27/20 1810          Time spent: 35 minutes-Greater than 50% of this time was spent in counseling, explanation of diagnosis, planning of further management, and coordination of care.  MEDICATIONS: Scheduled Meds:  acidophilus  2 capsule Oral TID   amoxicillin-clavulanate  1 tablet Oral Q12H   ascorbic acid  500 mg Oral Daily   levothyroxine  25 mcg Oral Q0600   Vilazodone HCl  20 mg Oral Daily   Continuous Infusions:  heparin 2,350 Units/hr (12/28/20 1050)   PRN Meds:.acetaminophen **OR** acetaminophen   PHYSICAL EXAM: Vital signs: Vitals:   12/27/20 1400 12/27/20 1622 12/27/20 2046 12/28/20 0507  BP: 126/79 109/83 116/64 114/72  Pulse: 73  82 73  Resp: (!) 21 (!) 24 20 19   Temp:   97.6 F (36.4 C) 97.8 F (36.6 C)  TempSrc:  Oral Oral Oral  SpO2: 94% 94% 90% 90%  Weight:  103.4 kg    Height:  6\' 2"  (1.88 m)     Filed Weights   12/27/20 1622  Weight: 103.4 kg   Body mass index is 29.27 kg/m.   Gen Exam:Alert awake-not  in any distress HEENT:atraumatic, normocephalic Chest: B/L clear to auscultation anteriorly CVS:S1S2 regular Abdomen:soft non tender, non distended Extremities:no edema Neurology: Non focal Skin: no rash  I have personally reviewed following labs and imaging studies  LABORATORY DATA: CBC: Recent Labs  Lab 12/27/20 1105 12/28/20 0127  WBC 10.7* 8.8  NEUTROABS 8.6*  --   HGB 13.6 13.1  HCT 40.6 39.3  MCV 86.9 87.7  PLT 227 865    Basic Metabolic Panel: Recent Labs  Lab 12/27/20 1105  NA 131*  K 3.9  CL 97*  CO2 27  GLUCOSE 123*  BUN 14  CREATININE 1.02  CALCIUM 8.2*    GFR: Estimated Creatinine Clearance: 88.9 mL/min (by C-G formula based on SCr of 1.02 mg/dL).  Liver Function Tests: Recent Labs  Lab 12/27/20 1105  AST 16  ALT 25  ALKPHOS 51  BILITOT 0.5  PROT 6.4*  ALBUMIN 3.0*   No results for input(s): LIPASE, AMYLASE in the last 168 hours. No results for input(s): AMMONIA in the last 168 hours.  Coagulation Profile: No results for input(s): INR, PROTIME in the last 168 hours.  Cardiac Enzymes: No results for input(s): CKTOTAL, CKMB, CKMBINDEX, TROPONINI in the last 168 hours.  BNP (last 3 results) No results for input(s): PROBNP in the last 8760 hours.  Lipid Profile: No results for input(s): CHOL, HDL, LDLCALC, TRIG, CHOLHDL, LDLDIRECT in the last 72 hours.  Thyroid Function Tests: No results for input(s): TSH, T4TOTAL, FREET4, T3FREE, THYROIDAB in the last 72 hours.  Anemia Panel: No results for input(s): VITAMINB12, FOLATE, FERRITIN, TIBC, IRON, RETICCTPCT in the last 72 hours.  Urine analysis: No results found for: COLORURINE, APPEARANCEUR, LABSPEC, PHURINE, GLUCOSEU, HGBUR, BILIRUBINUR, KETONESUR, PROTEINUR, UROBILINOGEN, NITRITE, LEUKOCYTESUR  Sepsis Labs: Lactic Acid, Venous No results found for: LATICACIDVEN  MICROBIOLOGY: Recent Results (from the past 240 hour(s))  Resp Panel by RT-PCR (Flu A&B, Covid) Nasopharyngeal Swab      Status: Abnormal   Collection Time: 12/27/20 11:42 AM   Specimen: Nasopharyngeal Swab; Nasopharyngeal(NP) swabs in vial transport medium  Result Value Ref Range Status   SARS Coronavirus 2 by RT PCR POSITIVE (A) NEGATIVE Final    Comment: RESULT CALLED TO, READ BACK BY AND VERIFIED WITH: Bernita Raisin, RN AT 7846 ON [id number] BY BOWLBY, J (NOTE) SARS-CoV-2 target nucleic acids are DETECTED.  The SARS-CoV-2 RNA is generally detectable in upper respiratory specimens during the acute phase of infection. Positive results are indicative of the presence of the identified virus, but do not rule out bacterial infection or co-infection with other pathogens not detected by the test. Clinical correlation with patient history and other diagnostic information is necessary to determine patient infection status. The expected result is Negative.  Fact Sheet for Patients: EntrepreneurPulse.com.au  Fact Sheet for Healthcare Providers: IncredibleEmployment.be  This test is not yet approved  or cleared by the [place] and  has been authorized for detection and/or diagnosis of SARS-CoV-2 by FDA under an Emergency Use Authorization (EUA).  This EUA will remain in effect (meaning thi s test can be used) for the duration of  the COVID-19 declaration under Section 564(b)(1) of the Act, 21 U.S.C. section 360bbb-3(b)(1), unless the authorization is terminated or revoked sooner.     Influenza A by PCR NEGATIVE NEGATIVE Final   Influenza B by PCR NEGATIVE NEGATIVE Final    Comment: (NOTE) The Xpert Xpress SARS-CoV-2/FLU/RSV plus assay is intended as an aid in the diagnosis of influenza from Nasopharyngeal swab specimens and should not be used as a sole basis for treatment. Nasal washings and aspirates are unacceptable for Xpert Xpress SARS-CoV-2/FLU/RSV testing.  Fact Sheet for Patients: EntrepreneurPulse.com.au  Fact Sheet for Healthcare  Providers: IncredibleEmployment.be  This test is not yet approved or cleared by the [place] FDA and has been authorized for detection and/or diagnosis of SARS-CoV-2 by FDA under an Emergency Use Authorization (EUA). This EUA will remain in effect (meaning this test can be used) for the duration of the COVID-19 declaration under Section 564(b)(1) of the Act, 21 U.S.C. section 360bbb-3(b)(1), unless the authorization is terminated or revoked.  Performed at [facility], [address] [place], [place] [address]     RADIOLOGY STUDIES/RESULTS: CT Angio Chest W/Cm &/Or Wo Cm  Result Date: 12/27/2020 CLINICAL DATA:  Increasing shortness of breath over the past few weeks, initial encounter EXAM: CT ANGIOGRAPHY CHEST WITH CONTRAST TECHNIQUE: Multidetector CT imaging of the chest was performed using the standard protocol during bolus administration of intravenous contrast. Multiplanar CT image reconstructions and MIPs were obtained to evaluate the vascular anatomy. CONTRAST:  169mL OMNIPAQUE IOHEXOL 350 MG/ML SOLN COMPARISON:  None. FINDINGS: Cardiovascular: Thoracic aorta and its branches demonstrate mild atherosclerotic calcification. No cardiac enlargement is seen. Pulmonary artery demonstrates multiple filling defects primarily on the right consistent with pulmonary emboli. No significant right heart strain is identified. Mild coronary calcifications are seen. Mediastinum/Nodes: Thoracic inlet is within normal limits. No sizable hilar or mediastinal adenopathy is noted. The esophagus as visualized is within normal limits. Lungs/Pleura: Small right pleural effusion is noted. Mild bibasilar atelectatic changes are seen slightly worse on the right than the left likely related to the underlying pulmonary emboli. No sizable parenchymal nodule is seen. Upper Abdomen: Visualized upper abdomen is within normal limits. Musculoskeletal: Degenerative changes of the thoracic  spine are noted. Review of the MIP images confirms the above findings. IMPRESSION: Bilateral pulmonary emboli right greater than left with associated atelectatic changes in the bases as well as a small right pleural effusion. No right heart strain is seen. Aortic Atherosclerosis (ICD10-I70.0). These results will be called to the ordering clinician or representative by the Radiologist Assistant, and communication documented in the PACS or [employer]. Electronically Signed   By: Inez Catalina M.D.   On: 12/27/2020 11:00   US Venous Img Lower Bilateral  Result Date: 12/27/2020 CLINICAL DATA:  Recent diagnosis of bilateral pulmonary emboli. EXAM: BILATERAL LOWER EXTREMITY VENOUS DOPPLER ULTRASOUND TECHNIQUE: Gray-scale sonography with compression, as well as color and duplex ultrasound, were performed to evaluate the deep venous system(s) from the level of the common femoral vein through the popliteal and proximal calf veins. COMPARISON:  07/02/2019 FINDINGS: VENOUS On the right, normal compressibility of the common femoral, superficial femoral, and popliteal veins, as well as the visualized calf veins. Visualized portions of profunda femoral  vein and great saphenous vein unremarkable. No filling defects to suggest DVT on grayscale or color Doppler imaging. Doppler waveforms show normal direction of venous flow, normal respiratory phasicity and response to augmentation. On the left, normal compressibility of the common femoral, and femoral vein. Saphenofemoral junction patent. There is hypoechoic noncompressible thrombus in the popliteal vein with a small amount of persistent antegrade signal on Doppler and color Doppler. There is partially occlusive thrombus in the gastric vein, and occlusive thrombus in the posterior tibial vein. OTHER None. Limitations: none IMPRESSION: 1. POSITIVE for left posterior tibial, gastric, and popliteal DVT. 2. Negative for right lower extremity DVT. Electronically Signed   By: Lucrezia Europe M.D.   On: 12/27/2020 13:41     LOS: 0 days   Oren Binet, MD  [facility]    To contact the attending provider between 7A-7P or the covering provider during after hours 7P-7A, please log into the web site [email or web address] and access using universal  password for that web site. If you do not have the password, please call the hospital operator.  12/28/2020, 11:17 AM

## 2020-12-28 NOTE — Evaluation (Signed)
Physical Therapy Evaluation Patient Details Name: Marc Lowe MRN: [id number] DOB: Aug 12, 1950 Today's Date: 12/28/2020   History of Present Illness  Pt adm with bilateral PE and LLE DVT. Anticoagulation initiated. PMH - recent Covid  Clinical Impression  Pt doing well with mobility and no further PT needed.  Ready for dc from PT standpoint. SpO2 94% on RA at rest and with amb.     Follow Up Recommendations No PT follow up    Equipment Recommendations  None recommended by PT    Recommendations for Other Services       Precautions / Restrictions Precautions Precautions: None      Mobility  Bed Mobility Overal bed mobility: Independent                  Transfers Overall transfer level: Independent Equipment used: None                Ambulation/Gait Ambulation/Gait assistance: Independent Gait Distance (Feet): 350 Feet Assistive device: None Gait Pattern/deviations: WFL(Within Functional Limits) Gait velocity: normal Gait velocity interpretation: >4.37 ft/sec, indicative of normal walking speed General Gait Details: Steady gait  Stairs            Wheelchair Mobility    Modified Rankin (Stroke Patients Only)       Balance Overall balance assessment: No apparent balance deficits (not formally assessed)                                           Pertinent Vitals/Pain      Home Living Family/patient expects to be discharged to:: Private residence Living Arrangements: Spouse/significant other                    Prior Function Level of Independence: Independent         Comments: works in Electronics engineer        Extremity/Trunk Assessment   Upper Extremity Assessment Upper Extremity Assessment: Overall WFL for tasks assessed    Lower Extremity Assessment Lower Extremity Assessment: Overall WFL for tasks assessed       Communication   Communication: No difficulties  Cognition  Arousal/Alertness: Awake/alert Behavior During Therapy: WFL for tasks assessed/performed Overall Cognitive Status: Within Functional Limits for tasks assessed                                        General Comments General comments (skin integrity, edema, etc.): SpO2 94% at rest and with amb on RA    Exercises     Assessment/Plan    PT Assessment Patent does not need any further PT services  PT Problem List         PT Treatment Interventions      PT Goals (Current goals can be found in the Care Plan section)  Acute Rehab PT Goals PT Goal Formulation: All assessment and education complete, DC therapy    Frequency     Barriers to discharge        Co-evaluation               AM-PAC PT "6 Clicks" Mobility  Outcome Measure Help needed turning from your back to your side while in a flat bed without using bedrails?: None Help needed moving from lying on  your back to sitting on the side of a flat bed without using bedrails?: None Help needed moving to and from a bed to a chair (including a wheelchair)?: None Help needed standing up from a chair using your arms (e.g., wheelchair or bedside chair)?: None Help needed to walk in hospital room?: None Help needed climbing 3-5 steps with a railing? : None 6 Click Score: 24    End of Session   Activity Tolerance: Patient tolerated treatment well Patient left: in bed;with call bell/phone within reach Nurse Communication: Mobility status PT Visit Diagnosis: Other abnormalities of gait and mobility (R26.89)    Time: 1445-1500 PT Time Calculation (min) (ACUTE ONLY): 15 min   Charges:   PT Evaluation $PT Eval Low Complexity: 1 Low          [place] Pager [phone] Office [place] 12/28/2020, 3:23 PM

## 2020-12-29 ENCOUNTER — Other Ambulatory Visit (HOSPITAL_COMMUNITY): Payer: Self-pay

## 2020-12-29 DIAGNOSIS — I2699 Other pulmonary embolism without acute cor pulmonale: Secondary | ICD-10-CM | POA: Diagnosis not present

## 2020-12-29 DIAGNOSIS — I824Z2 Acute embolism and thrombosis of unspecified deep veins of left distal lower extremity: Secondary | ICD-10-CM | POA: Diagnosis not present

## 2020-12-29 LAB — HEPARIN LEVEL (UNFRACTIONATED): Heparin Unfractionated: 0.34 IU/mL (ref 0.30–0.70)

## 2020-12-29 LAB — CBC
HCT: 37.7 % — ABNORMAL LOW (ref 39.0–52.0)
Hemoglobin: 13 g/dL (ref 13.0–17.0)
MCH: 29.6 pg (ref 26.0–34.0)
MCHC: 34.5 g/dL (ref 30.0–36.0)
MCV: 85.9 fL (ref 80.0–100.0)
Platelets: 246 10*3/uL (ref 150–400)
RBC: 4.39 MIL/uL (ref 4.22–5.81)
RDW: 13.2 % (ref 11.5–15.5)
WBC: 7.9 10*3/uL (ref 4.0–10.5)
nRBC: 0 % (ref 0.0–0.2)

## 2020-12-29 MED ORDER — APIXABAN 5 MG PO TABS
10.0000 mg | ORAL_TABLET | Freq: Two times a day (BID) | ORAL | Status: DC
  Administered 2020-12-29: 10 mg via ORAL
  Filled 2020-12-29 (×2): qty 2

## 2020-12-29 MED ORDER — APIXABAN (ELIQUIS) VTE STARTER PACK (10MG AND 5MG)
ORAL_TABLET | ORAL | 0 refills | Status: DC
  Filled 2020-12-29: qty 74, 30d supply, fill #0

## 2020-12-29 MED ORDER — APIXABAN 5 MG PO TABS: 5.0000 mg | ORAL_TABLET | Freq: Two times a day (BID) | ORAL | Status: DC

## 2020-12-29 NOTE — Discharge Summary (Signed)
PATIENT DETAILS Name: Marc Lowe Age: 70 y.o. Sex: male Date of Birth: Oct 19, 1950 MRN: [id number]. Admitting Physician: Jonetta Osgood, MD MEQ:[id number], Royetta Car, PA-C  Admit Date: 12/27/2020 Discharge date: 12/29/2020  Recommendations for Outpatient Follow-up:  Follow up with PCP in 1-2 weeks Please obtain CMP/CBC in one week New medication-Eliquis for VTE-will need refills by PCP.  Admitted From:  Home  Disposition: [place]: Yes  Equipment/Devices: None  Discharge Condition: Stable  CODE STATUS: FULL CODE  Diet recommendation:  Diet Order             Diet general           Diet Heart Room service appropriate? Yes; Fluid consistency: Thin  Diet effective now                    Brief Narrative: Patient is a 70 y.o. male HLD, hypothyroidism, anxiety/depression, recent COVID-87 infection-who presented with pleuritic right upper back pain-upon further work-up he was found to have PE and left lower extremity DVT.   Significant events: 6/15>> admitted for PE/LLE DVT.   Significant studies: 6/15>> CTA chest: Bilateral pulmonary emboli-right>> left 6/15>> bilateral lower extremity Doppler: Left DVT-posterior tibial, gastric, popliteal vein.   Antimicrobial therapy: None   Microbiology data: 6/15>> COVID PCR: Positive   Procedures : None   Consults: None    Brief Hospital Course: Pulmonary embolism-left lower extremity DVT: Provoked by recent COVID-19 infection-feels much better-minimal pleuritic chest pain-not hypoxic-hemodynamically stable-Echo with preserved RV function.  Initially on IV heparin-has been switched to Eliquis.  Stable for discharge today-he will follow-up with his primary care practitioner for further continued care-suspect he will require at least 6 months of uninterrupted anticoagulation.  Defer need to refer to hematology to PCP.   Recent COVID-19 infection: Per H&P-this was more than 2 weeks back-does not  require isolation.  He does not have any symptoms related to COVID-19.   Recent history of diverticulitis: On Augmentin-do not believe he needs to continue this on discharge.  His abdomen is completely benign.  He apparently has been on it for more than 7 days.   Hypothyroidism: Continue Synthroid   HLD: Continue Zetia   Hyponatremia: Very mild stable for monitoring by PCP   Anxiety/depression: Stable-continue Viibryd.  Discharge Diagnoses:  Active Problems:   Pulmonary embolus (HCC)   Bilateral pulmonary embolism (HCC)   Pulmonary embolism [facility])   Discharge Instructions:  Activity:  As tolerated    Discharge Instructions     Call MD for:  difficulty breathing, headache or visual disturbances   Complete by: As directed    Diet general   Complete by: As directed    Discharge instructions   Complete by: As directed    Follow with Primary MD  Donella Stade, PA-C in 1-2 weeks  Please get a complete blood count and chemistry panel checked by your Primary MD at your next visit, and again as instructed by your Primary MD.  Get Medicines reviewed and adjusted: Please take all your medications with you for your next visit with your Primary MD  Laboratory/radiological data: Please request your Primary MD to go over all hospital tests and procedure/radiological results at the follow up, please ask your Primary MD to get all Hospital records sent to his/her office.  In some cases, they will be blood work, cultures and biopsy results pending at the time of your discharge. Please request that your primary care M.D. follows up on  these results.  Also Note the following: If you experience worsening of your admission symptoms, develop shortness of breath, life threatening emergency, suicidal or homicidal thoughts you must seek medical attention immediately by calling 911 or calling your MD immediately  if symptoms less severe.  You must read complete instructions/literature along  with all the possible adverse reactions/side effects for all the Medicines you take and that have been prescribed to you. Take any new Medicines after you have completely understood and accpet all the possible adverse reactions/side effects.   Do not drive when taking Pain medications or sleeping medications (Benzodaizepines)  Do not take more than prescribed Pain, Sleep and Anxiety Medications. It is not advisable to combine anxiety,sleep and pain medications without talking with your primary care practitioner  Special Instructions: If you have smoked or chewed Tobacco  in the last 2 yrs please stop smoking, stop any regular Alcohol  and or any Recreational drug use.  Wear Seat belts while driving.  Please note: You were cared for by a hospitalist during your hospital stay. Once you are discharged, your primary care physician will handle any further medical issues. Please note that NO REFILLS for any discharge medications will be authorized once you are discharged, as it is imperative that you return to your primary care physician (or establish a relationship with a primary care physician if you do not have one) for your post hospital discharge needs so that they can reassess your need for medications and monitor your lab values.   Please get in touch with your primary care practitioner within 1-2 weeks for further refills for your Eliquis/blood thinner   Increase activity slowly   Complete by: As directed       Allergies as of 12/29/2020       Reactions   Nsaids Other (See Comments)   SHOULD NOT HAVE THIS CLASS OF MEDICATION- HISTORY OF DIVERTICULITIS   Hydromorphone Nausea And Vomiting   Atorvastatin Nausea Only, Other (See Comments)   Abdominal pain and reflux, also   Hydromorphone Hcl Nausea And Vomiting   Rosuvastatin Nausea And Vomiting   Zetia [ezetimibe] Nausea Only, Other (See Comments)   Caused an upset stomach and made patient's body hurt        Medication List      STOP taking these medications    aspirin 81 MG EC tablet   cyclobenzaprine 10 MG tablet Commonly known as: FLEXERIL   diclofenac sodium 1 % Gel Commonly known as: VOLTAREN   hyoscyamine 0.125 MG tablet Commonly known as: LEVSIN   prednisoLONE acetate 1 % ophthalmic suspension Commonly known as: PRED FORTE       TAKE these medications    Apixaban Starter Pack (10mg  and 5mg ) Commonly known as: ELIQUIS STARTER PACK Take as directed on package: start with two-5mg  tablets twice daily for 7 days. On day 8, switch to one-5mg  tablet twice daily.   ascorbic acid 500 MG tablet Commonly known as: VITAMIN C Take 500 mg by mouth daily.   B COMPLEX-C ER PO Take 1 tablet by mouth daily.   HYDROcodone-acetaminophen 5-325 MG tablet Commonly known as: NORCO/VICODIN Take 1 tablet by mouth every 6 (six) hours as needed for moderate pain.   levothyroxine 25 MCG tablet Commonly known as: Euthyrox Take 1 tablet (25 mcg total) by mouth daily before breakfast.   Magnesium 500 MG Tabs Take 500 mg by mouth daily.   magnesium gluconate 500 MG tablet Commonly known as: MAGONATE Take 500 mg by mouth  daily.   NONFORMULARY OR COMPOUNDED ITEM Place 1 drop into the left eye See admin instructions. Sterile/Compounded eyedrops (Prednisolone/Moxifloxacin/Bromfenac) Instill 1 drop into the left eye in the morning and at bedtime for 5 weeks   tamsulosin 0.4 MG Caps capsule Commonly known as: FLOMAX Take 0.4 mg by mouth at bedtime.   Turmeric 500 MG Caps Take 500 mg by mouth daily.   VIAGRA PO Take by mouth.   Viibryd 20 MG Tabs Generic drug: Vilazodone HCl Take 1 tablet (20 mg total) by mouth daily. What changed: how much to take   Vitamin D3 25 MCG (1000 UT) Caps Take 1,000 Units by mouth daily. What changed: Another medication with the same name was removed. Continue taking this medication, and follow the directions you see here.   ZINC 15 PO Take 50 mg by mouth every morning.    zinc gluconate 50 MG tablet Take 50 mg by mouth daily.       ASK your doctor about these medications    ezetimibe 10 MG tablet Commonly known as: Zetia Take 1 tablet (10 mg total) by mouth daily.        Allergies  Allergen Reactions   Nsaids Other (See Comments)    SHOULD NOT HAVE THIS CLASS OF MEDICATION- HISTORY OF DIVERTICULITIS   Hydromorphone Nausea And Vomiting   Atorvastatin Nausea Only and Other (See Comments)    Abdominal pain and reflux, also   Hydromorphone Hcl Nausea And Vomiting   Rosuvastatin Nausea And Vomiting   Zetia [Ezetimibe] Nausea Only and Other (See Comments)    Caused an upset stomach and made patient's body hurt    Other Procedures/Studies: CT Angio Chest W/Cm &/Or Wo Cm  Result Date: 12/27/2020 CLINICAL DATA:  Increasing shortness of breath over the past few weeks, initial encounter EXAM: CT ANGIOGRAPHY CHEST WITH CONTRAST TECHNIQUE: Multidetector CT imaging of the chest was performed using the standard protocol during bolus administration of intravenous contrast. Multiplanar CT image reconstructions and MIPs were obtained to evaluate the vascular anatomy. CONTRAST:  131mL OMNIPAQUE IOHEXOL 350 MG/ML SOLN COMPARISON:  None. FINDINGS: Cardiovascular: Thoracic aorta and its branches demonstrate mild atherosclerotic calcification. No cardiac enlargement is seen. Pulmonary artery demonstrates multiple filling defects primarily on the right consistent with pulmonary emboli. No significant right heart strain is identified. Mild coronary calcifications are seen. Mediastinum/Nodes: Thoracic inlet is within normal limits. No sizable hilar or mediastinal adenopathy is noted. The esophagus as visualized is within normal limits. Lungs/Pleura: Small right pleural effusion is noted. Mild bibasilar atelectatic changes are seen slightly worse on the right than the left likely related to the underlying pulmonary emboli. No sizable parenchymal nodule is seen. Upper Abdomen:  Visualized upper abdomen is within normal limits. Musculoskeletal: Degenerative changes of the thoracic spine are noted. Review of the MIP images confirms the above findings. IMPRESSION: Bilateral pulmonary emboli right greater than left with associated atelectatic changes in the bases as well as a small right pleural effusion. No right heart strain is seen. Aortic Atherosclerosis (ICD10-I70.0). These results will be called to the ordering clinician or representative by the Radiologist Assistant, and communication documented in the PACS or [employer]. Electronically Signed   By: Inez Catalina M.D.   On: 12/27/2020 11:00   US Venous Img Lower Bilateral  Result Date: 12/27/2020 CLINICAL DATA:  Recent diagnosis of bilateral pulmonary emboli. EXAM: BILATERAL LOWER EXTREMITY VENOUS DOPPLER ULTRASOUND TECHNIQUE: Gray-scale sonography with compression, as well as color and duplex ultrasound, were performed to evaluate the  deep venous system(s) from the level of the common femoral vein through the popliteal and proximal calf veins. COMPARISON:  07/02/2019 FINDINGS: VENOUS On the right, normal compressibility of the common femoral, superficial femoral, and popliteal veins, as well as the visualized calf veins. Visualized portions of profunda femoral vein and great saphenous vein unremarkable. No filling defects to suggest DVT on grayscale or color Doppler imaging. Doppler waveforms show normal direction of venous flow, normal respiratory phasicity and response to augmentation. On the left, normal compressibility of the common femoral, and femoral vein. Saphenofemoral junction patent. There is hypoechoic noncompressible thrombus in the popliteal vein with a small amount of persistent antegrade signal on Doppler and color Doppler. There is partially occlusive thrombus in the gastric vein, and occlusive thrombus in the posterior tibial vein. OTHER None. Limitations: none IMPRESSION: 1. POSITIVE for left posterior  tibial, gastric, and popliteal DVT. 2. Negative for right lower extremity DVT. Electronically Signed   By: Lucrezia Europe M.D.   On: 12/27/2020 13:41   ECHOCARDIOGRAM COMPLETE  Result Date: 12/28/2020    ECHOCARDIOGRAM REPORT   Patient Name:   Marc Lowe Date of Exam: 12/28/2020 Medical Rec #:  [id number]        Height:       74.0 in Accession #:    [id number]       Weight:       228.0 lb Date of Birth:  1951/02/05       BSA:          2.298 m Patient Age:    45 years         BP:           114/72 mmHg Patient Gender: M                HR:           59 bpm. Exam Location:  Inpatient Procedure: 2D Echo, Cardiac Doppler and Color Doppler Indications:    I26.02 Pulmonary embolus  History:        Patient has no prior history of Echocardiogram examinations.                 COVID-19 Positive.  Sonographer:    Jonelle Sidle Dance Referring Phys: [id number] Lequita Halt  Sonographer Comments: No subcostal window. IMPRESSIONS  1. Left ventricular ejection fraction, by estimation, is 55 to 60%. The left ventricle has normal function. The left ventricle has no regional wall motion abnormalities. There is mild concentric left ventricular hypertrophy. Left ventricular diastolic parameters are indeterminate.  2. Right ventricular systolic function is normal. The right ventricular size is normal.  3. The mitral valve is normal in structure. Trivial mitral valve regurgitation. No evidence of mitral stenosis.  4. The aortic valve is normal in structure. Aortic valve regurgitation is trivial. No aortic stenosis is present.  5. Aortic dilatation noted. There is mild dilatation of the aortic root, measuring 43 mm. There is borderline dilatation of the ascending aorta, measuring 37 mm. FINDINGS  Left Ventricle: Left ventricular ejection fraction, by estimation, is 55 to 60%. The left ventricle has normal function. The left ventricle has no regional wall motion abnormalities. The left ventricular internal cavity size was normal in size. There  is  mild concentric left ventricular hypertrophy. Left ventricular diastolic parameters are indeterminate. Normal left ventricular filling pressure. Right Ventricle: The right ventricular size is normal. No increase in right ventricular wall thickness. Right ventricular systolic function is normal. Left Atrium: Left atrial size  was normal in size. Right Atrium: Right atrial size was normal in size. Pericardium: There is no evidence of pericardial effusion. Mitral Valve: The mitral valve is normal in structure. Trivial mitral valve regurgitation. No evidence of mitral valve stenosis. Tricuspid Valve: The tricuspid valve is normal in structure. Tricuspid valve regurgitation is trivial. No evidence of tricuspid stenosis. Aortic Valve: The aortic valve is normal in structure. Aortic valve regurgitation is trivial. No aortic stenosis is present. Pulmonic Valve: The pulmonic valve was normal in structure. Pulmonic valve regurgitation is trivial. No evidence of pulmonic stenosis. Aorta: Aortic dilatation noted. There is mild dilatation of the aortic root, measuring 43 mm. There is borderline dilatation of the ascending aorta, measuring 37 mm. Venous: The inferior vena cava was not well visualized. IAS/Shunts: No atrial level shunt detected by color flow Doppler.  LEFT VENTRICLE PLAX 2D LVIDd:         4.40 cm  Diastology LVIDs:         2.70 cm  LV e' medial:    5.98 cm/s LV PW:         1.40 cm  LV E/e' medial:  11.9 LV IVS:        1.30 cm  LV e' lateral:   7.07 cm/s LVOT diam:     2.00 cm  LV E/e' lateral: 10.1 LV SV:         77 LV SV Index:   33 LVOT Area:     3.14 cm  RIGHT VENTRICLE RV Basal diam:  3.20 cm RV Mid diam:    2.10 cm RV S prime:     12.80 cm/s TAPSE (M-mode): 2.0 cm LEFT ATRIUM           Index       RIGHT ATRIUM           Index LA diam:      3.20 cm 1.39 cm/m  RA Area:     21.10 cm LA Vol (A2C): 77.3 ml 33.64 ml/m RA Volume:   63.50 ml  27.63 ml/m LA Vol (A4C): 77.3 ml 33.64 ml/m  AORTIC VALVE LVOT  Vmax:   107.00 cm/s LVOT Vmean:  74.600 cm/s LVOT VTI:    0.245 m  AORTA Ao Root diam: 4.30 cm Ao Asc diam:  3.70 cm MITRAL VALVE               TRICUSPID VALVE MV Area (PHT): 4.46 cm    TR Peak grad:   17.5 mmHg MV Decel Time: 170 msec    TR Vmax:        209.00 cm/s MV E velocity: 71.10 cm/s MV A velocity: 52.30 cm/s  SHUNTS MV E/A ratio:  1.36        Systemic VTI:  0.24 m                            Systemic Diam: 2.00 cm Fransico Him MD Electronically signed by Fransico Him MD Signature Date/Time: 12/28/2020/2:59:59 PM    Final      TODAY-DAY OF DISCHARGE:  Subjective:   Lynder Parents today has no headache,no chest abdominal pain,no new weakness tingling or numbness, feels much better wants to go home today.   Objective:   Blood pressure (!) 143/92, pulse 65, temperature 98.5 F (36.9 C), temperature source Oral, resp. rate (!) 21, height 6\' 2"  (1.88 m), weight 100.1 kg, SpO2 94 %.  Intake/Output Summary (Last 24 hours) at 12/29/2020  1100 Last data filed at 12/29/2020 0400 Gross per 24 hour  Intake 849.29 ml  Output --  Net 849.29 ml   Filed Weights   12/27/20 1622 12/29/20 0440  Weight: 103.4 kg 100.1 kg    Exam: Awake Alert, Oriented *3, No new F.N deficits, Normal affect [place].AT,PERRAL Supple Neck,No JVD, No cervical lymphadenopathy appriciated.  Symmetrical Chest wall movement, Good air movement bilaterally, CTAB RRR,No Gallops,Rubs or new Murmurs, No Parasternal Heave +ve B.Sounds, Abd Soft, Non tender, No organomegaly appriciated, No rebound -guarding or rigidity. No Cyanosis, Clubbing or edema, No new Rash or bruise   PERTINENT RADIOLOGIC STUDIES: US Venous Img Lower Bilateral  Result Date: 12/27/2020 CLINICAL DATA:  Recent diagnosis of bilateral pulmonary emboli. EXAM: BILATERAL LOWER EXTREMITY VENOUS DOPPLER ULTRASOUND TECHNIQUE: Gray-scale sonography with compression, as well as color and duplex ultrasound, were performed to evaluate the deep venous system(s) from the  level of the common femoral vein through the popliteal and proximal calf veins. COMPARISON:  07/02/2019 FINDINGS: VENOUS On the right, normal compressibility of the common femoral, superficial femoral, and popliteal veins, as well as the visualized calf veins. Visualized portions of profunda femoral vein and great saphenous vein unremarkable. No filling defects to suggest DVT on grayscale or color Doppler imaging. Doppler waveforms show normal direction of venous flow, normal respiratory phasicity and response to augmentation. On the left, normal compressibility of the common femoral, and femoral vein. Saphenofemoral junction patent. There is hypoechoic noncompressible thrombus in the popliteal vein with a small amount of persistent antegrade signal on Doppler and color Doppler. There is partially occlusive thrombus in the gastric vein, and occlusive thrombus in the posterior tibial vein. OTHER None. Limitations: none IMPRESSION: 1. POSITIVE for left posterior tibial, gastric, and popliteal DVT. 2. Negative for right lower extremity DVT. Electronically Signed   By: Lucrezia Europe M.D.   On: 12/27/2020 13:41   ECHOCARDIOGRAM COMPLETE  Result Date: 12/28/2020    ECHOCARDIOGRAM REPORT   Patient Name:   Marc Lowe Date of Exam: 12/28/2020 Medical Rec #:  [id number]        Height:       74.0 in Accession #:    [id number]       Weight:       228.0 lb Date of Birth:  [name] 27, 1951       BSA:          2.298 m Patient Age:    37 years         BP:           114/72 mmHg Patient Gender: M                HR:           59 bpm. Exam Location:  Inpatient Procedure: 2D Echo, Cardiac Doppler and Color Doppler Indications:    I26.02 Pulmonary embolus  History:        Patient has no prior history of Echocardiogram examinations.                 COVID-19 Positive.  Sonographer:    Jonelle Sidle Dance Referring Phys: [id number] Lequita Halt  Sonographer Comments: No subcostal window. IMPRESSIONS  1. Left ventricular ejection fraction, by  estimation, is 55 to 60%. The left ventricle has normal function. The left ventricle has no regional wall motion abnormalities. There is mild concentric left ventricular hypertrophy. Left ventricular diastolic parameters are indeterminate.  2. Right ventricular systolic function is normal. The right ventricular size is  normal.  3. The mitral valve is normal in structure. Trivial mitral valve regurgitation. No evidence of mitral stenosis.  4. The aortic valve is normal in structure. Aortic valve regurgitation is trivial. No aortic stenosis is present.  5. Aortic dilatation noted. There is mild dilatation of the aortic root, measuring 43 mm. There is borderline dilatation of the ascending aorta, measuring 37 mm. FINDINGS  Left Ventricle: Left ventricular ejection fraction, by estimation, is 55 to 60%. The left ventricle has normal function. The left ventricle has no regional wall motion abnormalities. The left ventricular internal cavity size was normal in size. There is  mild concentric left ventricular hypertrophy. Left ventricular diastolic parameters are indeterminate. Normal left ventricular filling pressure. Right Ventricle: The right ventricular size is normal. No increase in right ventricular wall thickness. Right ventricular systolic function is normal. Left Atrium: Left atrial size was normal in size. Right Atrium: Right atrial size was normal in size. Pericardium: There is no evidence of pericardial effusion. Mitral Valve: The mitral valve is normal in structure. Trivial mitral valve regurgitation. No evidence of mitral valve stenosis. Tricuspid Valve: The tricuspid valve is normal in structure. Tricuspid valve regurgitation is trivial. No evidence of tricuspid stenosis. Aortic Valve: The aortic valve is normal in structure. Aortic valve regurgitation is trivial. No aortic stenosis is present. Pulmonic Valve: The pulmonic valve was normal in structure. Pulmonic valve regurgitation is trivial. No evidence of  pulmonic stenosis. Aorta: Aortic dilatation noted. There is mild dilatation of the aortic root, measuring 43 mm. There is borderline dilatation of the ascending aorta, measuring 37 mm. Venous: The inferior vena cava was not well visualized. IAS/Shunts: No atrial level shunt detected by color flow Doppler.  LEFT VENTRICLE PLAX 2D LVIDd:         4.40 cm  Diastology LVIDs:         2.70 cm  LV e' medial:    5.98 cm/s LV PW:         1.40 cm  LV E/e' medial:  11.9 LV IVS:        1.30 cm  LV e' lateral:   7.07 cm/s LVOT diam:     2.00 cm  LV E/e' lateral: 10.1 LV SV:         77 LV SV Index:   33 LVOT Area:     3.14 cm  RIGHT VENTRICLE RV Basal diam:  3.20 cm RV Mid diam:    2.10 cm RV S prime:     12.80 cm/s TAPSE (M-mode): 2.0 cm LEFT ATRIUM           Index       RIGHT ATRIUM           Index LA diam:      3.20 cm 1.39 cm/m  RA Area:     21.10 cm LA Vol (A2C): 77.3 ml 33.64 ml/m RA Volume:   63.50 ml  27.63 ml/m LA Vol (A4C): 77.3 ml 33.64 ml/m  AORTIC VALVE LVOT Vmax:   107.00 cm/s LVOT Vmean:  74.600 cm/s LVOT VTI:    0.245 m  AORTA Ao Root diam: 4.30 cm Ao Asc diam:  3.70 cm MITRAL VALVE               TRICUSPID VALVE MV Area (PHT): 4.46 cm    TR Peak grad:   17.5 mmHg MV Decel Time: 170 msec    TR Vmax:        209.00 cm/s MV E velocity: 71.10  cm/s MV A velocity: 52.30 cm/s  SHUNTS MV E/A ratio:  1.36        Systemic VTI:  0.24 m                            Systemic Diam: 2.00 cm Fransico Him MD Electronically signed by Fransico Him MD Signature Date/Time: 12/28/2020/2:59:59 PM    Final      PERTINENT LAB RESULTS: CBC: Recent Labs    12/28/20 0127 12/29/20 0032  WBC 8.8 7.9  HGB 13.1 13.0  HCT 39.3 37.7*  PLT 212 246   CMET CMP     Component Value Date/Time   NA 131 (L) 12/27/2020 1105   K 3.9 12/27/2020 1105   CL 97 (L) 12/27/2020 1105   CO2 27 12/27/2020 1105   GLUCOSE 123 (H) 12/27/2020 1105   BUN 14 12/27/2020 1105   CREATININE 1.02 12/27/2020 1105   CREATININE 0.89 05/29/2020 0000    CALCIUM 8.2 (L) 12/27/2020 1105   PROT 6.4 (L) 12/27/2020 1105   ALBUMIN 3.0 (L) 12/27/2020 1105   AST 16 12/27/2020 1105   ALT 25 12/27/2020 1105   ALKPHOS 51 12/27/2020 1105   BILITOT 0.5 12/27/2020 1105   GFRNONAA >60 12/27/2020 1105   GFRNONAA 88 05/29/2020 0000   GFRAA 103 05/29/2020 0000    GFR Estimated Creatinine Clearance: 87.6 mL/min (by C-G formula based on SCr of 1.02 mg/dL). No results for input(s): LIPASE, AMYLASE in the last 72 hours. No results for input(s): CKTOTAL, CKMB, CKMBINDEX, TROPONINI in the last 72 hours. Invalid input(s): POCBNP No results for input(s): DDIMER in the last 72 hours. No results for input(s): HGBA1C in the last 72 hours. No results for input(s): CHOL, HDL, LDLCALC, TRIG, CHOLHDL, LDLDIRECT in the last 72 hours. No results for input(s): TSH, T4TOTAL, T3FREE, THYROIDAB in the last 72 hours.  Invalid input(s): FREET3 No results for input(s): VITAMINB12, FOLATE, FERRITIN, TIBC, IRON, RETICCTPCT in the last 72 hours. Coags: No results for input(s): INR in the last 72 hours.  Invalid input(s): PT Microbiology: Recent Results (from the past 240 hour(s))  Resp Panel by RT-PCR (Flu A&B, Covid) Nasopharyngeal Swab     Status: Abnormal   Collection Time: 12/27/20 11:42 AM   Specimen: Nasopharyngeal Swab; Nasopharyngeal(NP) swabs in vial transport medium  Result Value Ref Range Status   SARS Coronavirus 2 by RT PCR POSITIVE (A) NEGATIVE Final    Comment: RESULT CALLED TO, READ BACK BY AND VERIFIED WITH: Bernita Raisin, RN AT [id number] ON [id number] BY BOWLBY, J (NOTE) SARS-CoV-2 target nucleic acids are DETECTED.  The SARS-CoV-2 RNA is generally detectable in upper respiratory specimens during the acute phase of infection. Positive results are indicative of the presence of the identified virus, but do not rule out bacterial infection or co-infection with other pathogens not detected by the test. Clinical correlation with patient history and other  diagnostic information is necessary to determine patient infection status. The expected result is Negative.  Fact Sheet for Patients: EntrepreneurPulse.com.au  Fact Sheet for Healthcare Providers: IncredibleEmployment.be  This test is not yet approved or cleared by the [place] FDA and  has been authorized for detection and/or diagnosis of SARS-CoV-2 by FDA under an Emergency Use Authorization (EUA).  This EUA will remain in effect (meaning thi s test can be used) for the duration of  the COVID-19 declaration under Section 564(b)(1) of the Act, 21 U.S.C. section 360bbb-3(b)(1), unless the authorization is terminated  or revoked sooner.     Influenza A by PCR NEGATIVE NEGATIVE Final   Influenza B by PCR NEGATIVE NEGATIVE Final    Comment: (NOTE) The Xpert Xpress SARS-CoV-2/FLU/RSV plus assay is intended as an aid in the diagnosis of influenza from Nasopharyngeal swab specimens and should not be used as a sole basis for treatment. Nasal washings and aspirates are unacceptable for Xpert Xpress SARS-CoV-2/FLU/RSV testing.  Fact Sheet for Patients: EntrepreneurPulse.com.au  Fact Sheet for Healthcare Providers: IncredibleEmployment.be  This test is not yet approved or cleared by the [place] FDA and has been authorized for detection and/or diagnosis of SARS-CoV-2 by FDA under an Emergency Use Authorization (EUA). This EUA will remain in effect (meaning this test can be used) for the duration of the COVID-19 declaration under Section 564(b)(1) of the Act, 21 U.S.C. section 360bbb-3(b)(1), unless the authorization is terminated or revoked.  Performed at [facility], [address] [place], [place] [address]     FURTHER DISCHARGE INSTRUCTIONS:  Get Medicines reviewed and adjusted: Please take all your medications with you for your next visit with your Primary  MD  Laboratory/radiological data: Please request your Primary MD to go over all hospital tests and procedure/radiological results at the follow up, please ask your Primary MD to get all Hospital records sent to his/her office.  In some cases, they will be blood work, cultures and biopsy results pending at the time of your discharge. Please request that your primary care M.D. goes through all the records of your hospital data and follows up on these results.  Also Note the following: If you experience worsening of your admission symptoms, develop shortness of breath, life threatening emergency, suicidal or homicidal thoughts you must seek medical attention immediately by calling 911 or calling your MD immediately  if symptoms less severe.  You must read complete instructions/literature along with all the possible adverse reactions/side effects for all the Medicines you take and that have been prescribed to you. Take any new Medicines after you have completely understood and accpet all the possible adverse reactions/side effects.   Do not drive when taking Pain medications or sleeping medications (Benzodaizepines)  Do not take more than prescribed Pain, Sleep and Anxiety Medications. It is not advisable to combine anxiety,sleep and pain medications without talking with your primary care practitioner  Special Instructions: If you have smoked or chewed Tobacco  in the last 2 yrs please stop smoking, stop any regular Alcohol  and or any Recreational drug use.  Wear Seat belts while driving.  Please note: You were cared for by a hospitalist during your hospital stay. Once you are discharged, your primary care physician will handle any further medical issues. Please note that NO REFILLS for any discharge medications will be authorized once you are discharged, as it is imperative that you return to your primary care physician (or establish a relationship with a primary care physician if you do not have  one) for your post hospital discharge needs so that they can reassess your need for medications and monitor your lab values.  Total Time spent coordinating discharge including counseling, education and face to face time equals 35 minutes.  Signed: Azaryah Oleksy 12/29/2020 11:00 AM

## 2020-12-29 NOTE — Discharge Instructions (Signed)

## 2020-12-29 NOTE — Progress Notes (Signed)
[place] for Heparin>>>>>>Apixaban Indication: bilateral pulmonary embolus and DVT LLE  Allergies  Allergen Reactions   Nsaids Other (See Comments)    SHOULD NOT HAVE THIS CLASS OF MEDICATION- HISTORY OF DIVERTICULITIS   Hydromorphone Nausea And Vomiting   Atorvastatin Nausea Only and Other (See Comments)    Abdominal pain and reflux, also   Hydromorphone Hcl Nausea And Vomiting   Rosuvastatin Nausea And Vomiting   Zetia [Ezetimibe] Nausea Only and Other (See Comments)    Caused an upset stomach and made patient's body hurt    Patient Measurements: Height: 6\' 2"  (188 cm) Weight: 100.1 kg (220 lb 10.9 oz) IBW/kg (Calculated) : 82.2 Heparin Dosing Weight: 100 kg  Vital Signs: Temp: 99.8 F (37.7 C) (06/16 2006) Temp Source: Oral (06/16 2006) BP: 117/80 (06/16 2006) Pulse Rate: 67 (06/16 2006)  Labs: Recent Labs    12/27/20 1105 12/27/20 1736 12/28/20 0127 12/28/20 1006 12/28/20 1915 12/29/20 0032  HGB 13.6  --  13.1  --   --  13.0  HCT 40.6  --  39.3  --   --  37.7*  PLT 227  --  212  --   --  246  HEPARINUNFRC  --    < > 0.14* 0.31 0.32 0.34  CREATININE 1.02  --   --   --   --   --   TROPONINIHS 4  --   --   --   --   --    < > = values in this interval not displayed.     Estimated Creatinine Clearance: 87.6 mL/min (by C-G formula based on SCr of 1.02 mg/dL).  Assessment: 70 yr old man presented with chest pain and shortness of breath. Pt was recently diagnosed with COVID and has been hypoxic at home. CT revealed bilateral pulmonary emboli and pleural effusion without right heart strain. Doppler revealed LLE occlusive thrombus. Pharmacy was consulted for IV heparin dosing. Pt was no on anticoagulant PTA.  6/17 AM update:  Changing from heparin to Apixaban   Goal of Therapy:  Monitor platelets by anticoagulation protocol: Yes   Plan:  DC heparin Start Apixaban 10 mg BID x 7 days, then 5 mg BID Monitor for  bleeding  Narda Bonds, PharmD, BCPS Clinical Pharmacist Phone: [phone]

## 2020-12-29 NOTE — Progress Notes (Signed)
Discharge instructions (including medications) discussed with and copy provided to patient/caregiver 

## 2021-01-01 ENCOUNTER — Telehealth: Payer: Self-pay

## 2021-01-01 NOTE — Telephone Encounter (Signed)
Transition Care Management Follow-up Telephone Call Date of discharge and from where: 12/29/2020 from [facility] How have you been since you were released from the hospital? Pt stated that he is feeling well and has not questions or concerns at this time.  Any questions or concerns? No  Items Reviewed: Did the pt receive and understand the discharge instructions provided? Yes  Medications obtained and verified? Yes  Other? No  Any new allergies since your discharge? No  Dietary orders reviewed? No Do you have support at home? Yes   Functional Questionnaire: (I = Independent and D = Dependent) ADLs: I  Bathing/Dressing- I  Meal Prep- I  Eating- I  Maintaining continence- I  Transferring/Ambulation- I  Managing Meds- I   Follow up appointments reviewed:  PCP Hospital f/u appt confirmed? No   Specialist Hospital f/u appt confirmed? No   Are transportation arrangements needed? No  If their condition worsens, is the pt aware to call PCP or go to the Emergency Dept.? Yes Was the patient provided with contact information for the PCP's office or ED? Yes Was to pt encouraged to call back with questions or concerns? Yes

## 2021-01-03 ENCOUNTER — Telehealth (HOSPITAL_BASED_OUTPATIENT_CLINIC_OR_DEPARTMENT_OTHER): Payer: Self-pay

## 2021-01-03 ENCOUNTER — Telehealth (HOSPITAL_BASED_OUTPATIENT_CLINIC_OR_DEPARTMENT_OTHER): Payer: Self-pay | Admitting: Family Medicine

## 2021-01-03 ENCOUNTER — Other Ambulatory Visit: Payer: Self-pay

## 2021-01-03 ENCOUNTER — Ambulatory Visit (INDEPENDENT_AMBULATORY_CARE_PROVIDER_SITE_OTHER): Payer: Medicare Other | Admitting: Physician Assistant

## 2021-01-03 ENCOUNTER — Ambulatory Visit (HOSPITAL_BASED_OUTPATIENT_CLINIC_OR_DEPARTMENT_OTHER)
Admission: RE | Admit: 2021-01-03 | Discharge: 2021-01-03 | Disposition: A | Discharge status: Home / Self Care | Payer: Medicare Other | Source: Ambulatory Visit | Attending: Physician Assistant | Admitting: Physician Assistant

## 2021-01-03 ENCOUNTER — Encounter: Payer: Self-pay | Admitting: Physician Assistant

## 2021-01-03 VITALS — BP 118/81 | HR 86 | Ht 74.0 in | Wt 210.0 lb

## 2021-01-03 DIAGNOSIS — Z8616 Personal history of COVID-19: Secondary | ICD-10-CM

## 2021-01-03 DIAGNOSIS — R1032 Left lower quadrant pain: Secondary | ICD-10-CM | POA: Insufficient documentation

## 2021-01-03 DIAGNOSIS — I824Y2 Acute embolism and thrombosis of unspecified deep veins of left proximal lower extremity: Secondary | ICD-10-CM | POA: Diagnosis not present

## 2021-01-03 DIAGNOSIS — Z8719 Personal history of other diseases of the digestive system: Secondary | ICD-10-CM

## 2021-01-03 DIAGNOSIS — K5720 Diverticulitis of large intestine with perforation and abscess without bleeding: Secondary | ICD-10-CM | POA: Diagnosis not present

## 2021-01-03 DIAGNOSIS — I2699 Other pulmonary embolism without acute cor pulmonale: Secondary | ICD-10-CM | POA: Diagnosis not present

## 2021-01-03 DIAGNOSIS — K651 Peritoneal abscess: Secondary | ICD-10-CM | POA: Diagnosis not present

## 2021-01-03 MED ORDER — IOHEXOL 300 MG/ML  SOLN
100.0000 mL | Freq: Once | INTRAMUSCULAR | Status: AC | PRN
  Administered 2021-01-03: 100 mL via INTRAVENOUS

## 2021-01-03 MED ORDER — ELIQUIS 5 MG PO TABS: 5.0000 mg | ORAL_TABLET | Freq: Two times a day (BID) | ORAL | 4 refills | Status: DC

## 2021-01-03 NOTE — Progress Notes (Signed)
Subjective:    Patient ID: Marc Lowe, male    DOB: 23-Oct-1950, 70 y.o.   MRN: [id number]  HPI Pt is a 70 yo male with recent history of covid, pulmonary embolism, left DVT and history of diverticulitis who presents to the clinic today to follow up from hospital. He had virtual visit on 12/27/2020 where I sent him to have CTA which showed bilateral PE and DVT. He was admitted to hospital on 6/15 and discharged on 6/17. His cipro/flagyl was stopped for diverticulitis. He had no abdominal pain on discharge but left lower quadrant pain started back 3 days after discharge and has continue to worsen. He was discharged on eliquis 10mg  bid for 7 days then decrease to 5mg  bid. He does have hx of diverticulitis and he states that is what it feels like. He has no appetite. Stools are normal with no melena or hematochezia. His breathing is better. His left calf pain is better. He has lost 18lbs in 3 weeks.   .. 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   HYPOGONADISM, MALE 11/04/2006   Depression, recurrent ([place]) 11/04/2006   VARICES OF OTHER SITES 10/04/2009   CHRONIC RHINITIS 06/04/2007   PARONYCHIA, FINGER 07/13/2009   CELLULITIS AND ABSCESS OF OTHER SPECIFIED SITE 05/09/2008   ACNE ROSACEA 10/07/2006   SKIN RASH 07/13/2009   DIARRHEA 03/14/2010   ABDOMINAL PAIN, GENERALIZED 03/14/2010   IMPAIRED FASTING GLUCOSE 07/24/2009   ELEVATED PROSTATE SPECIFIC ANTIGEN 04/10/2009   LIBIDO, DECREASED 10/07/2006   History of bowel resection 04/14/2014   Testosterone deficiency 04/14/2014   Hyperlipidemia 04/15/2014   Cervical nerve root impingement 04/11/2015   Left lower quadrant abdominal pain 05/08/2016   History of diverticulitis 05/08/2016   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   Family history of atrial fibrillation 06/11/2016   Left shoulder pain 11/21/2016   Toenail fungus 12/16/2016   No energy 12/17/2016   Elevated TSH 12/18/2016   Anhedonia  05/13/2017   Abnormal weight gain 05/13/2017   Cervical radiculopathy 02/18/2020   DDD (degenerative disc disease), cervical 02/18/2020   Mixed hyperlipidemia 02/18/2020   Memory changes 02/18/2020   Seborrheic keratoses 02/21/2020   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   Need for zoster vaccination 05/29/2020   Acute respiratory failure with hypoxia ([place]) 12/27/2020   Intercostal pain 12/27/2020   Mid back pain on right side 12/27/2020   SOB (shortness of breath) 12/27/2020   COVID-19 virus infection 12/27/2020   Pulmonary embolus ([place]) 12/27/2020   Bilateral pulmonary embolism ([place]) 12/27/2020   Pulmonary embolism ([place]) 12/28/2020   Acute deep vein thrombosis (DVT) of proximal vein of left lower extremity ([place]) 01/03/2021   History of COVID-19 01/03/2021   Resolved Ambulatory Problems    Diagnosis Date Noted   HYPERLIPIDEMIA 10/07/2006   VIRAL URI 05/09/2008   Localized swelling of left lower extremity 07/02/2019   Past Medical History:  Diagnosis Date   Depression    Diverticulitis     Review of Systems See HPI.     Objective:   Physical Exam Vitals reviewed.  Constitutional:      Appearance: He is toxic-appearing.  HENT:     Head: Normocephalic.  Cardiovascular:     Rate and Rhythm: Normal rate and regular rhythm.     Pulses: Normal pulses.     Heart sounds: Normal heart sounds.  Pulmonary:     Effort: Pulmonary effort is normal.     Breath sounds: Normal breath  sounds.  Abdominal:     Palpations: Abdomen is soft.     Comments: Hyperactive bowel sounds in every quadrant. Pain to palpation over LUQ and LLQ. Worse with guarding over LLQ.   Musculoskeletal:     Comments: Bilateral legs with varicose veins. No left calf swelling/redness/tenderness to palpaiton.   Skin:    Coloration: Skin is pale.  Neurological:     General: No focal deficit present.     Mental Status: He is alert and oriented to person, place, and time.  Psychiatric:         Mood and Affect: Mood normal.          Assessment & Plan:  [name] KitchenMarland KitchenJackob was seen today for hospitalization follow-up.  Diagnoses and all orders for this visit:  Bilateral pulmonary embolism (HCC) -     COMPLETE METABOLIC PANEL WITH GFR -     CBC w/Diff/Platelet  Acute deep vein thrombosis (DVT) of proximal vein of left lower extremity (HCC) -     COMPLETE METABOLIC PANEL WITH GFR -     CBC w/Diff/Platelet  Left lower quadrant abdominal pain -     CT Abdomen Pelvis W Contrast; Future  History of COVID-19  History of diverticulitis  Other orders -     apixaban (ELIQUIS) 5 MG TABS tablet; Take 1 tablet (5 mg total) by mouth 2 (two) times daily.  Vitals are reassuring. Pulse ox back up to 99 percent.  Pt will need to stay on eliquis 5mg  bid for at least 6 months.  Coupon card given and rx printed.  Cmp/cbc ordered.  Discussed nutrition/hydration/walking. It will take some time to get his strength back.  Will send to hematologist for consult on blood clots and anticoagulation timeline.   Concerned for diverticulitis. Pt did finish a 7 day course of treatment with cipro and metronidazole and get better but then got worse. Concerned for complications. Will CT of abdomen/pelvis to confirm no abscess or worrisome infection. I do not want to treat empirically with abx again due to risk of C.difficile.   Follow up as needed or if symptoms worsening.

## 2021-01-03 NOTE — Telephone Encounter (Signed)
Marc Lowe is a 70 year old male with history of recent hospitalization for pulmonary emboli and DVT.  Also has history of diverticulitis which has required prior colon resection.  He was seen by PCP today for hospital follow-up and was found to have gradually worsening abdominal pain over the past 3 days.  He has also had suppressed appetite, however patient does report that he has been able to tolerate some p.o. intake including fluids and food.  In speaking with patient now, he reports that at rest he does not experience any abdominal pain, but does notice it when he moves.  Pain feels like a "pulled abdominal muscle".  He denies any issues with fevers, chills, sweats, lightheadedness.  Denies any change in bowel habits.  I reviewed results of CT abdomen and pelvis that was completed this evening.  Imaging revealed bowel wall thickening of small bowel loops in the left anterior abdomen as well as an abscess measuring 4.4 x 4.8 x 4 cm located between the thickened small bowel loops.  Discussed with patient that this finding does correlate with his location of abdominal pain and is the likely underlying cause.  Discussed with he and his wife that given his prior history, age, current pain, suppressed appetite, size of the abscess, would recommend evaluation in the emergency department and that he would likely need inpatient management for this abscess which may require procedural intervention such as percutaneous drainage or surgical intervention.  Patient wife did voice understanding, however they were hesitant to present to the emergency room tonight and thus stated that they would likely plan to present to the emergency department first thing in the morning.  I did discuss potential risks associated with this such as progression of the abscess and intra-abdominal process which could lead to bowel obstruction, dissemination of infection leading to sepsis, worse prognosis, increased risk of death.  They voiced  understanding.  They were appreciative of the call to review the results as well as taking a time to explain findings and recommendations.  I advised that should they have any further questions or concerns that they can contact the after-hours number as needed.

## 2021-01-03 NOTE — Telephone Encounter (Signed)
Patient had CT Abdomen and Pelvis done at [facility] today. Patient had positive finding of abscess per report. Called [facility] to leave report. Report given to on-call nurse Solmon Ice, RN who verbally acknowledged results and would communicate to on-call provider.

## 2021-01-04 ENCOUNTER — Encounter (HOSPITAL_COMMUNITY): Payer: Self-pay | Admitting: Pharmacy Technician

## 2021-01-04 ENCOUNTER — Other Ambulatory Visit: Payer: Self-pay

## 2021-01-04 ENCOUNTER — Inpatient Hospital Stay (HOSPITAL_COMMUNITY)
Admission: EM | Admit: 2021-01-04 | Discharge: 2021-01-08 | DRG: 391 | Disposition: A | Discharge status: Home Health | Payer: Medicare Other | Attending: Family Medicine | Admitting: Family Medicine

## 2021-01-04 ENCOUNTER — Other Ambulatory Visit (HOSPITAL_BASED_OUTPATIENT_CLINIC_OR_DEPARTMENT_OTHER): Payer: Medicare Other

## 2021-01-04 DIAGNOSIS — Z933 Colostomy status: Secondary | ICD-10-CM | POA: Diagnosis not present

## 2021-01-04 DIAGNOSIS — Z888 Allergy status to other drugs, medicaments and biological substances status: Secondary | ICD-10-CM | POA: Diagnosis not present

## 2021-01-04 DIAGNOSIS — K5720 Diverticulitis of large intestine with perforation and abscess without bleeding: Principal | ICD-10-CM | POA: Diagnosis present

## 2021-01-04 DIAGNOSIS — E039 Hypothyroidism, unspecified: Secondary | ICD-10-CM | POA: Diagnosis present

## 2021-01-04 DIAGNOSIS — F339 Major depressive disorder, recurrent, unspecified: Secondary | ICD-10-CM | POA: Diagnosis present

## 2021-01-04 DIAGNOSIS — F32A Depression, unspecified: Secondary | ICD-10-CM | POA: Diagnosis present

## 2021-01-04 DIAGNOSIS — Z79899 Other long term (current) drug therapy: Secondary | ICD-10-CM

## 2021-01-04 DIAGNOSIS — I2699 Other pulmonary embolism without acute cor pulmonale: Secondary | ICD-10-CM | POA: Diagnosis present

## 2021-01-04 DIAGNOSIS — Z9049 Acquired absence of other specified parts of digestive tract: Secondary | ICD-10-CM

## 2021-01-04 DIAGNOSIS — K651 Peritoneal abscess: Secondary | ICD-10-CM

## 2021-01-04 DIAGNOSIS — Z79890 Hormone replacement therapy (postmenopausal): Secondary | ICD-10-CM | POA: Diagnosis not present

## 2021-01-04 DIAGNOSIS — Z86718 Personal history of other venous thrombosis and embolism: Secondary | ICD-10-CM

## 2021-01-04 DIAGNOSIS — Z8616 Personal history of COVID-19: Secondary | ICD-10-CM | POA: Diagnosis present

## 2021-01-04 DIAGNOSIS — F419 Anxiety disorder, unspecified: Secondary | ICD-10-CM | POA: Diagnosis present

## 2021-01-04 DIAGNOSIS — Z886 Allergy status to analgesic agent status: Secondary | ICD-10-CM | POA: Diagnosis not present

## 2021-01-04 DIAGNOSIS — I872 Venous insufficiency (chronic) (peripheral): Secondary | ICD-10-CM | POA: Diagnosis present

## 2021-01-04 DIAGNOSIS — Z8679 Personal history of other diseases of the circulatory system: Secondary | ICD-10-CM

## 2021-01-04 DIAGNOSIS — E785 Hyperlipidemia, unspecified: Secondary | ICD-10-CM | POA: Diagnosis present

## 2021-01-04 DIAGNOSIS — Z885 Allergy status to narcotic agent status: Secondary | ICD-10-CM | POA: Diagnosis not present

## 2021-01-04 DIAGNOSIS — Z86711 Personal history of pulmonary embolism: Secondary | ICD-10-CM

## 2021-01-04 DIAGNOSIS — N400 Enlarged prostate without lower urinary tract symptoms: Secondary | ICD-10-CM | POA: Diagnosis present

## 2021-01-04 HISTORY — DX: Hypothyroidism, unspecified: E03.9

## 2021-01-04 HISTORY — DX: Other pulmonary embolism without acute cor pulmonale: I26.99

## 2021-01-04 HISTORY — DX: Benign prostatic hyperplasia without lower urinary tract symptoms: N40.0

## 2021-01-04 LAB — COMPLETE METABOLIC PANEL WITH GFR
AG Ratio: 1.2 (calc) (ref 1.0–2.5)
ALT: 30 U/L (ref 9–46)
AST: 20 U/L (ref 10–35)
Albumin: 3.7 g/dL (ref 3.6–5.1)
Alkaline phosphatase (APISO): 66 U/L (ref 35–144)
BUN: 15 mg/dL (ref 7–25)
CO2: 28 mmol/L (ref 20–32)
Calcium: 9.1 mg/dL (ref 8.6–10.3)
Chloride: 100 mmol/L (ref 98–110)
Creat: 0.88 mg/dL (ref 0.70–1.25)
GFR, Est African American: 102 mL/min/{1.73_m2} (ref >=60)
GFR, Est Non African American: 88 mL/min/{1.73_m2} (ref >=60)
Globulin: 3.1 g/dL (calc) (ref 1.9–3.7)
Glucose, Bld: 105 mg/dL — ABNORMAL HIGH (ref 65–99)
Potassium: 4.5 mmol/L (ref 3.5–5.3)
Sodium: 137 mmol/L (ref 135–146)
Total Bilirubin: 0.5 mg/dL (ref 0.2–1.2)
Total Protein: 6.8 g/dL (ref 6.1–8.1)

## 2021-01-04 LAB — CBC
HCT: 44.7 % (ref 39.0–52.0)
Hemoglobin: 14.5 g/dL (ref 13.0–17.0)
MCH: 28.9 pg (ref 26.0–34.0)
MCHC: 32.4 g/dL (ref 30.0–36.0)
MCV: 89.2 fL (ref 80.0–100.0)
Platelets: 378 10*3/uL (ref 150–400)
RBC: 5.01 MIL/uL (ref 4.22–5.81)
RDW: 13.3 % (ref 11.5–15.5)
WBC: 7.6 10*3/uL (ref 4.0–10.5)
nRBC: 0 % (ref 0.0–0.2)

## 2021-01-04 LAB — COMPREHENSIVE METABOLIC PANEL
ALT: 38 U/L (ref 0–44)
AST: 26 U/L (ref 15–41)
Albumin: 3.3 g/dL — ABNORMAL LOW (ref 3.5–5.0)
Alkaline Phosphatase: 64 U/L (ref 38–126)
Anion gap: 12 (ref 5–15)
BUN: 10 mg/dL (ref 8–23)
CO2: 23 mmol/L (ref 22–32)
Calcium: 9.3 mg/dL (ref 8.9–10.3)
Chloride: 102 mmol/L (ref 98–111)
Creatinine, Ser: 0.89 mg/dL (ref 0.61–1.24)
GFR, Estimated: >60 mL/min (ref >60)
Glucose, Bld: 98 mg/dL (ref 70–99)
Potassium: 4.4 mmol/L (ref 3.5–5.1)
Sodium: 137 mmol/L (ref 135–145)
Total Bilirubin: 0.9 mg/dL (ref 0.3–1.2)
Total Protein: 7.4 g/dL (ref 6.5–8.1)

## 2021-01-04 LAB — LIPASE, BLOOD: Lipase: 35 U/L (ref 11–51)

## 2021-01-04 LAB — CBC WITH DIFFERENTIAL/PLATELET
Absolute Monocytes: 842 cells/uL (ref 200–950)
Basophils Absolute: 31 cells/uL (ref 0–200)
Basophils Relative: 0.4 %
Eosinophils Absolute: 39 cells/uL (ref 15–500)
Eosinophils Relative: 0.5 %
HCT: 44.6 % (ref 38.5–50.0)
Hemoglobin: 14.6 g/dL (ref 13.2–17.1)
Lymphs Abs: 1030 cells/uL (ref 850–3900)
MCH: 29 pg (ref 27.0–33.0)
MCHC: 32.7 g/dL (ref 32.0–36.0)
MCV: 88.5 fL (ref 80.0–100.0)
MPV: 9.5 fL (ref 7.5–12.5)
Monocytes Relative: 10.8 %
Neutro Abs: 5858 cells/uL (ref 1500–7800)
Neutrophils Relative %: 75.1 %
Platelets: 450 10*3/uL — ABNORMAL HIGH (ref 140–400)
RBC: 5.04 10*6/uL (ref 4.20–5.80)
RDW: 12.8 % (ref 11.0–15.0)
Total Lymphocyte: 13.2 %
WBC: 7.8 10*3/uL (ref 3.8–10.8)

## 2021-01-04 LAB — URINALYSIS, ROUTINE W REFLEX MICROSCOPIC
Bacteria, UA: NONE SEEN
Bilirubin Urine: NEGATIVE
Glucose, UA: NEGATIVE mg/dL
Hgb urine dipstick: NEGATIVE
Ketones, ur: NEGATIVE mg/dL
Leukocytes,Ua: NEGATIVE
Nitrite: NEGATIVE
Protein, ur: 30 mg/dL — AB
Specific Gravity, Urine: 1.023 (ref 1.005–1.030)
pH: 5 (ref 5.0–8.0)

## 2021-01-04 LAB — LACTIC ACID, PLASMA: Lactic Acid, Venous: 1.7 mmol/L (ref 0.5–1.9)

## 2021-01-04 LAB — APTT
aPTT: 37 seconds — ABNORMAL HIGH (ref 24–36)
aPTT: 96 seconds — ABNORMAL HIGH (ref 24–36)

## 2021-01-04 LAB — PROTIME-INR
INR: 1.4 — ABNORMAL HIGH (ref 0.8–1.2)
Prothrombin Time: 16.9 seconds — ABNORMAL HIGH (ref 11.4–15.2)

## 2021-01-04 LAB — HEPARIN LEVEL (UNFRACTIONATED): Heparin Unfractionated: >1.1 IU/mL — ABNORMAL HIGH (ref 0.30–0.70)

## 2021-01-04 MED ORDER — ALBUTEROL SULFATE (2.5 MG/3ML) 0.083% IN NEBU: 2.5000 mg | INHALATION_SOLUTION | RESPIRATORY_TRACT | Status: DC | PRN

## 2021-01-04 MED ORDER — LACTATED RINGERS IV SOLN: INTRAVENOUS | Status: DC

## 2021-01-04 MED ORDER — BISACODYL 5 MG PO TBEC: 5.0000 mg | DELAYED_RELEASE_TABLET | Freq: Every day | ORAL | Status: DC | PRN

## 2021-01-04 MED ORDER — ACETAMINOPHEN 325 MG PO TABS
650.0000 mg | ORAL_TABLET | Freq: Four times a day (QID) | ORAL | Status: DC | PRN
  Administered 2021-01-05: 650 mg via ORAL
  Filled 2021-01-04: qty 2

## 2021-01-04 MED ORDER — NONFORMULARY OR COMPOUNDED ITEM
1.0000 [drp] | Freq: Two times a day (BID) | Status: DC
  Administered 2021-01-05 – 2021-01-08 (×7): 1 [drp] via OPHTHALMIC

## 2021-01-04 MED ORDER — HEPARIN (PORCINE) 25000 UT/250ML-% IV SOLN
2000.0000 [IU]/h | INTRAVENOUS | Status: DC
  Administered 2021-01-04 – 2021-01-07 (×4): 2000 [IU]/h via INTRAVENOUS
  Filled 2021-01-04 (×8): qty 250

## 2021-01-04 MED ORDER — HYDRALAZINE HCL 20 MG/ML IJ SOLN: 5.0000 mg | INTRAMUSCULAR | Status: DC | PRN

## 2021-01-04 MED ORDER — POLYETHYLENE GLYCOL 3350 17 G PO PACK: 17.0000 g | PACK | Freq: Every day | ORAL | Status: DC | PRN

## 2021-01-04 MED ORDER — ONDANSETRON HCL 4 MG/2ML IJ SOLN: 4.0000 mg | Freq: Four times a day (QID) | INTRAMUSCULAR | Status: DC | PRN

## 2021-01-04 MED ORDER — ACETAMINOPHEN 650 MG RE SUPP: 650.0000 mg | Freq: Four times a day (QID) | RECTAL | Status: DC | PRN

## 2021-01-04 MED ORDER — NON FORMULARY
1.0000 [drp] | Freq: Two times a day (BID) | Status: DC
  Administered 2021-01-04: 1 [drp] via OPHTHALMIC

## 2021-01-04 MED ORDER — ONDANSETRON HCL 4 MG PO TABS: 4.0000 mg | ORAL_TABLET | Freq: Four times a day (QID) | ORAL | Status: DC | PRN

## 2021-01-04 MED ORDER — PIPERACILLIN-TAZOBACTAM 3.375 G IVPB 30 MIN: 3.3750 g | Freq: Three times a day (TID) | INTRAVENOUS | Status: DC

## 2021-01-04 MED ORDER — PIPERACILLIN-TAZOBACTAM 3.375 G IVPB
3.3750 g | Freq: Three times a day (TID) | INTRAVENOUS | Status: DC
  Administered 2021-01-04 – 2021-01-07 (×9): 3.375 g via INTRAVENOUS
  Filled 2021-01-04 (×9): qty 50

## 2021-01-04 MED ORDER — MORPHINE SULFATE (PF) 2 MG/ML IV SOLN
2.0000 mg | INTRAVENOUS | Status: DC | PRN
  Administered 2021-01-05 – 2021-01-07 (×3): 2 mg via INTRAVENOUS
  Filled 2021-01-04 (×3): qty 1

## 2021-01-04 MED ORDER — VILAZODONE HCL 20 MG PO TABS
20.0000 mg | ORAL_TABLET | Freq: Every day | ORAL | Status: DC
  Administered 2021-01-05 – 2021-01-08 (×4): 20 mg via ORAL
  Filled 2021-01-04 (×4): qty 1

## 2021-01-04 MED ORDER — LEVOTHYROXINE SODIUM 25 MCG PO TABS
25.0000 ug | ORAL_TABLET | Freq: Every day | ORAL | Status: DC
  Administered 2021-01-05 – 2021-01-08 (×4): 25 ug via ORAL
  Filled 2021-01-04 (×4): qty 1

## 2021-01-04 MED ORDER — PIPERACILLIN-TAZOBACTAM 3.375 G IVPB 30 MIN
3.3750 g | Freq: Once | INTRAVENOUS | Status: AC
  Administered 2021-01-04: 3.375 g via INTRAVENOUS
  Filled 2021-01-04: qty 50

## 2021-01-04 MED ORDER — TAMSULOSIN HCL 0.4 MG PO CAPS
0.4000 mg | ORAL_CAPSULE | Freq: Every day | ORAL | Status: DC
  Administered 2021-01-04 – 2021-01-07 (×4): 0.4 mg via ORAL
  Filled 2021-01-04 (×5): qty 1

## 2021-01-04 NOTE — Consult Note (Signed)
Consult Note  Marc Lowe 1951/01/09  [id number].    Requesting MD: Quintella Reichert, MD Chief Complaint/Reason for Consult: intra-abdominal abscess  HPI:  70 year old male with medical history of BPH, depression, and recent hospitalization (6/15-6/17) for pleurisy and SOB found to have a PE now on eliquis (LD 6/22 pm) (presumed from recent COVID infection on 6/3) who presented to the [facility] ED due to outpatient CT scan showing an intra-abdominal abscess with surrounding thickening of his small bowel.  No diverticulitis was noted per se.   One June 3rd the patient was found to have COVID.  A few days later he began having some LLQ abdominal pain.  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  He saw his PCP who put him on Cipro/Flagyl.  He began to feel better.  He was still eating and moving his bowels at home with only a slight decrease in his appetite.  Fevers were no greater than 100.  When he got admitted, he has not completed his abx therapy and his pain was resolved.  His abx were stopped at that time.  This past weekend at home his abdominal pain began to return.  He as stated above, saw his PCP yesterday and sent him for a CT scan which reveals bowel wall thickening of multiple small bowel loops in the left anterior abdomen. 4.4 x 4.8 x 4 cm abscess between the small bowel loops in the left mid abdomen. No pneumatosis, pneumoperitoneum or portal venous gas. No bowel obstruction.  We have been asked to see him for further evaluation and recommendations.  Of note, the patient has his last colonoscopy about a year ago and was reported as normal, except a rectal polyp that was removed.   ROS:all other systems reviewed and are negative Review of Systems  Constitutional:  Negative for chills and fever.  Respiratory:  Negative for cough and  shortness of breath.   Cardiovascular:  Negative for leg swelling.  Gastrointestinal:  Positive for abdominal pain. Negative for constipation, diarrhea, nausea and vomiting.  Genitourinary:  Positive for frequency.   Family History  Problem Relation Age of Onset   Atrial fibrillation Father     Past Medical History:  Diagnosis Date   BPH (benign prostatic hyperplasia)    Depression    Diverticulitis    Hypothyroidism (acquired)    Pulmonary emboli [facility])     Past Surgical History:  Procedure Laterality Date   COLON RESECTION SIGMOID     2005    Social History:  reports that he has never smoked. He has never used smokeless tobacco. He reports current alcohol use of about 3.0 standard drinks of alcohol per week. He reports that he does not use drugs.  Allergies:  Allergies  Allergen Reactions   Nsaids Other (See Comments)    SHOULD NOT HAVE THIS CLASS OF MEDICATION- HISTORY OF DIVERTICULITIS   Hydromorphone Nausea And Vomiting   Atorvastatin Nausea Only and Other (See Comments)    Abdominal pain and reflux, also   Hydromorphone Hcl Nausea And Vomiting   Rosuvastatin Nausea And Vomiting   Zetia [Ezetimibe] Nausea Only and Other (See Comments)    Caused an upset stomach and made patient's body hurt    (Not in a hospital admission)   Blood pressure 109/76, pulse (!) 59, temperature 98.8  F (37.1 C), resp. rate 16, SpO2 96 %. Physical Exam:  General: pleasant, WD, WN male/male who is laying in bed in NAD HEENT: head is normocephalic, atraumatic.  Sclera are noninjected.  PERRL.  Ears and nose without any masses or lesions.  Mouth is pink and moist Heart: regular, rate, and rhythm.  Normal s1,s2. No obvious murmurs, gallops, or rubs noted.  Palpable radial and pedal pulses bilaterally Lungs: CTAB, no wheezes, rhonchi, or rales noted.  Respiratory effort nonlabored Abd: soft, mild left mid and left lower abdominal discomfort, no guarding or peritonitis, ND, +BS, no masses,  hernias, or organomegaly.  Scars noted from prior surgeries MS: all 4 extremities are symmetrical with no cyanosis, clubbing, or edema. Skin: warm and dry with no masses, lesions, or rashes Neuro: Cranial nerves 2-12 grossly intact, sensation is normal throughout Psych: A&Ox3 with an appropriate affect.   Results for orders placed or performed during the hospital encounter of 01/04/21 (from the past 48 hour(s))  Urinalysis, Routine w reflex microscopic Urine, Clean Catch     Status: Abnormal   Collection Time: 01/04/21  7:16 AM  Result Value Ref Range   Color, Urine YELLOW YELLOW   APPearance HAZY (A) CLEAR   Specific Gravity, Urine 1.023 1.005 - 1.030   pH 5.0 5.0 - 8.0   Glucose, UA NEGATIVE NEGATIVE mg/dL   Hgb urine dipstick NEGATIVE NEGATIVE   Bilirubin Urine NEGATIVE NEGATIVE   Ketones, ur NEGATIVE NEGATIVE mg/dL   Protein, ur 30 (A) NEGATIVE mg/dL   Nitrite NEGATIVE NEGATIVE   Leukocytes,Ua NEGATIVE NEGATIVE   RBC / HPF 0-5 0 - 5 RBC/hpf   WBC, UA 0-5 0 - 5 WBC/hpf   Bacteria, UA NONE SEEN NONE SEEN   Mucus PRESENT     Comment: Performed at [facility], [address], [place], [place] [address]  Lipase, blood     Status: None   Collection Time: 01/04/21  7:40 AM  Result Value Ref Range   Lipase 35 11 - 51 U/L    Comment: Performed at [facility] [address] [place], [place] [address]  Comprehensive metabolic panel     Status: Abnormal   Collection Time: 01/04/21  7:40 AM  Result Value Ref Range   Sodium 137 135 - 145 mmol/L   Potassium 4.4 3.5 - 5.1 mmol/L   Chloride 102 98 - 111 mmol/L   CO2 23 22 - 32 mmol/L   Glucose, Bld 98 70 - 99 mg/dL    Comment: Glucose reference range applies only to samples taken after fasting for at least 8 hours.   BUN 10 8 - 23 mg/dL   Creatinine, Ser 0.89 0.61 - 1.24 mg/dL   Calcium 9.3 8.9 - 10.3 mg/dL   Total Protein 7.4 6.5 - 8.1 g/dL   Albumin 3.3 (L) 3.5 - 5.0 g/dL   AST 26 15 - 41 U/L   ALT 38 0 - 44 U/L    Alkaline Phosphatase 64 38 - 126 U/L   Total Bilirubin 0.9 0.3 - 1.2 mg/dL   GFR, Estimated >60 >60 mL/min    Comment: (NOTE) Calculated using the CKD-EPI Creatinine Equation (2021)    Anion gap 12 5 - 15    Comment: Performed at [facility] [address] [place], [place] [address]  CBC     Status: None   Collection Time: 01/04/21  7:40 AM  Result Value Ref Range   WBC 7.6 4.0 - 10.5  K/uL   RBC 5.01 4.22 - 5.81 MIL/uL   Hemoglobin 14.5 13.0 - 17.0 g/dL   HCT 44.7 39.0 - 52.0 %   MCV 89.2 80.0 - 100.0 fL   MCH 28.9 26.0 - 34.0 pg   MCHC 32.4 30.0 - 36.0 g/dL   RDW 13.3 11.5 - 15.5 %   Platelets 378 150 - 400 K/uL   nRBC 0.0 0.0 - 0.2 %    Comment: Performed at [facility], [place] [address] [place], [place] [address]  Lactic acid, plasma     Status: None   Collection Time: 01/04/21  7:40 AM  Result Value Ref Range   Lactic Acid, Venous 1.7 0.5 - 1.9 mmol/L    Comment: Performed at [facility] [address], [place], [place] [address]   CT Abdomen Pelvis W Contrast  Result Date: 01/03/2021 CLINICAL DATA:  History of diverticulitis. Hyperactive bowel sounds. EXAM: CT ABDOMEN AND PELVIS WITH CONTRAST TECHNIQUE: Multidetector CT imaging of the abdomen and pelvis was performed using the standard protocol following bolus administration of intravenous contrast. CONTRAST:  18mL OMNIPAQUE IOHEXOL 300 MG/ML  SOLN COMPARISON:  05/10/2016 FINDINGS: Lower chest: Small right pleural effusion. Right basilar airspace disease. Hepatobiliary: No focal liver abnormality is seen. No gallstones, gallbladder wall thickening, or biliary dilatation. Pancreas: Unremarkable. No pancreatic ductal dilatation or surrounding inflammatory changes. Spleen: Normal in size without focal abnormality. Adrenals/Urinary Tract: Normal adrenal glands. No urolithiasis or obstructive uropathy. Dystrophic left renal cortical calcification. Decompressed bladder. Stomach/Bowel: No bowel dilatation to  suggest bowel obstruction. Bowel wall thickening of multiple small bowel loops in the left anterior abdomen. 4.4 x 4.8 x 4 cm complex fluid collection interspersed within the bowel loops in the left mid abdomen with air in the collection most consistent with an abscess. No pneumatosis, pneumoperitoneum or portal venous gas. Vascular/Lymphatic: Normal caliber abdominal aorta with mild atherosclerosis. No lymphadenopathy. Reproductive: Prostate is unremarkable. Other: Anterior abdominal wall hernia repair. Small right spigelian hernia of the right without incarceration. Musculoskeletal: Degenerative disease with disc height loss at L4-5 and L5-S1 with bilateral facet arthropathy. No acute osseous abnormality. No aggressive osseous lesion. IMPRESSION: 1. Bowel wall thickening of multiple small bowel loops in the left anterior abdomen. 4.4 x 4.8 x 4 cm abscess between the small bowel loops in the left mid abdomen. No pneumatosis, pneumoperitoneum or portal venous gas. No bowel obstruction. 2.  Aortic Atherosclerosis (ICD10-I70.0). Electronically Signed   By: Kathreen Devoid   On: 01/03/2021 18:33      Assessment/Plan LLQ intra-abdominal abscess, unclear etiology The patient appears to have an intra-abdominal abscess near multiple loops of thickened small bowel.  His CT scan does not show diverticulitis but his colon is near this area so it is not out of the realm of possibility.  Either way the treatment is currently still the same with IV abx therapy and request for IR to evaluate for a percutaneous drain.  His last dose of eliquis was last night.  This will need to be held and he can be started on a heparin gtt.  If IR unable to proceed for 48 hrs due to eliquis, ok from our standpoint to let the patient have a diet.  No plans for surgical intervention at this time as hopefully this will improve with conservative management.    FEN: NPO, but may eat from my standpoint if unable to get procedure ID: [email or web address] VTE:  SCDs, recommend holding eliquis and starting heparin gtt  Recent  COVID-19 infection PE  Henreitta Cea, [facility] Surgery 01/04/2021, 2:05 PM Please see Amion for pager number during day hours 7:00am-4:30pm

## 2021-01-04 NOTE — Progress Notes (Signed)
ANTICOAGULATION CONSULT NOTE - Follow-Up Consult  Pharmacy Consult for heparin Indication: pulmonary embolus (on Eliquis PTA)   Patient Measurements: Heparin Dosing Weight: 95.3kg  Vital Signs: Temp: 98.2 F (36.8 C) (06/23 1925) Temp Source: Oral (06/23 1925) BP: 117/73 (06/23 1925) Pulse Rate: 65 (06/23 1925)  Labs: Recent Labs    01/03/21 0000 01/04/21 0740 01/04/21 1435 01/04/21 2055  HGB 14.6 14.5  --   --   HCT 44.6 44.7  --   --   PLT 450* 378  --   --   APTT  --   --  37* 96*  LABPROT  --   --  16.9*  --   INR  --   --  1.4*  --   HEPARINUNFRC  --   --  >1.10*  --   CREATININE 0.88 0.89  --   --      Estimated Creatinine Clearance: 92.4 mL/min (by C-G formula based on SCr of 0.89 mg/dL).   Assessment: 70 yo M presents with abdominal pain. Recent hospitalized from 6/15-17 for COVID + DVT/PE and discharged on Eliquis, last dose 6/22 evening. Returned with abdominal pain, h/o of diverticulitis with partial bowel resection. CT with abscess with now IR drainage needed.   aPTT level this evening is therapeutic (aPTT 96, goal of 66-102). No bleeding or issues noted per RN.   Goal of Therapy:  aPTT 66-102 seconds Monitor platelets by anticoagulation protocol: Yes   Plan:  - Continue Heparin at 2000 units/hr (20 ml/hr) - Will continue to monitor for any signs/symptoms of bleeding and will follow up with aPTT level in 6 hours to confirm therapeutic   Thank you for allowing pharmacy to be a part of this patient's care.  Alycia Rossetti, PharmD, BCPS Clinical Pharmacist Clinical phone for 01/04/2021: [phone] 01/04/2021 9:59 PM   **Pharmacist phone directory can now be found on [email or web address] ([email or web address]).  Listed under [place].

## 2021-01-04 NOTE — ED Provider Notes (Addendum)
Emergency Medicine Provider Triage Evaluation Note  Marc Lowe , a 70 y.o. male  was evaluated in triage.  Pt complains of abdominal pain x 1 week. Sent in by Dr. Kyung Rudd for abscess found on CT abdomen yesterday. Last BM normal yesterday. No nausea, no vomiting. Febrile Tmax of 100.  Review of Systems  Positive: abdominal pain,fever,  Negative: Nausea, vomiting  Physical Exam  There were no vitals taken for this visit. Gen:   Awake, no distress   Resp:  Normal effort  MSK:   Moves extremities without difficulty  Other:  TTP along left abdominal upper and lower quadrant.   Medical Decision Making  Medically screening exam initiated at 7:19 AM.  Appropriate orders placed.  Marc Lowe was informed that the remainder of the evaluation will be completed by another provider, this initial triage assessment does not replace that evaluation, and the importance of remaining in the ED until their evaluation is complete.   Here s/p abx therapy to treat diverticulitis with abscess present. Page to general surgery placed. Labs ordered.   Prior chart reviewed CT Abdomen and Pelvis:  Imaging revealed bowel wall thickening of small bowel loops in the left anterior abdomen as well as an abscess measuring 4.4 x 4.8 x 4 cm located between the thickened small bowel loops   8:46 AM Spoke to Marc Lowe, general surgery who is aware of patient. However, looks like abscess is on abdominal wall likely IR intervention. Patient has been added to General surgery.    Marc Fitting, PA-C 01/04/21 0723    Marc Fitting, PA-C 01/04/21 0827    Marc Fitting, PA-C 01/04/21 6184    Gareth Morgan, MD 01/05/21 [phone]

## 2021-01-04 NOTE — Progress Notes (Signed)
ANTICOAGULATION CONSULT NOTE - Initial Consult  Pharmacy Consult for heparin Indication: pulmonary embolus (on Eliquis PTA)  Allergies  Allergen Reactions   Nsaids Other (See Comments)    SHOULD NOT HAVE THIS CLASS OF MEDICATION- HISTORY OF DIVERTICULITIS   Hydromorphone Nausea And Vomiting   Atorvastatin Nausea Only and Other (See Comments)    Abdominal pain and reflux, also   Hydromorphone Hcl Nausea And Vomiting   Rosuvastatin Nausea And Vomiting   Zetia [Ezetimibe] Nausea Only and Other (See Comments)    Caused an upset stomach and made patient's body hurt    Patient Measurements: Heparin Dosing Weight: 95.3kg  Vital Signs: Temp: 98.8 F (37.1 C) (06/23 0800) BP: 109/76 (06/23 1300) Pulse Rate: 59 (06/23 1300)  Labs: Recent Labs    01/03/21 0000 01/04/21 0740  HGB 14.6 14.5  HCT 44.6 44.7  PLT 450* 378  CREATININE 0.88 0.89    Estimated Creatinine Clearance: 92.4 mL/min (by C-G formula based on SCr of 0.89 mg/dL).   Medical History: Past Medical History:  Diagnosis Date   BPH (benign prostatic hyperplasia)    Depression    Diverticulitis    Hypothyroidism (acquired)    Pulmonary emboli [facility])    Assessment: 70 yo M presents with abdominal pain. Recent hospitalized from 6/15-17 for COVID + DVT/PE and discharged on Eliquis, last dose 6/22 evening. Returned with abdominal pain, h/o of diverticulitis with partial bowel resection. CT with abscess with now IR drainage needed.   Goal of Therapy:  aPTT 66-102 seconds Monitor platelets by anticoagulation protocol: Yes   Plan:  Start heparin infusion at 2000 units/hr. When here last week patient was very difficult to get therapeutic on heparin infusion, with multiple boluses and infusion increases, eventually got to low end of goal before switched to Eliquis.  -f/u on 6h aPTT -Monitor daily aPTT, CBC, and any s/sx of bleeding  Joetta Manners, PharmD, [facility] Clinical Pharmacist  Please check  AMION for all [facility] [place] phone numbers After 10:00 PM, call [place] [phone]

## 2021-01-04 NOTE — ED Notes (Signed)
Attempted to call report, was requested to call back in a few minutes.

## 2021-01-04 NOTE — ED Provider Notes (Signed)
[facility] EMERGENCY DEPARTMENT Provider Note   CSN: [id number] Arrival date & time: 01/04/21  8588     History Chief Complaint  Patient presents with   Abdominal Pain    Marc Lowe is a 70 y.o. male.  The history is provided by the patient and medical records.  Abdominal Pain Marc Lowe is a 70 y.o. male who presents to the Emergency Department complaining of abdominal pain, abnormal CT scan.  Had an outpatient CT scan yesterday that demonstrated diverticular abscess.  Recently hospitalized for COVID 19 and PE. Currently taking Eliquis.  He has lower abdominal pain that started one week ago. No fever, vomiting, nausea, diarrhea, dysuria. He was treated for diverticulitis with Augmentin just prior to his hospitalization with COVID-19. He was discharged on June 17. He did have an outpatient CT scan performed on June 5, which was negative for diverticulitis at that time.    Past Medical History:  Diagnosis Date   Depression    Diverticulitis     Patient Active Problem List   Diagnosis Date Noted   Colonic diverticular abscess 01/04/2021   Acute deep vein thrombosis (DVT) of proximal vein of left lower extremity ([place]) 01/03/2021   History of COVID-19 01/03/2021   Pulmonary embolism ([place]) 12/28/2020   Acute respiratory failure with hypoxia ([place]) 12/27/2020   Intercostal pain 12/27/2020   Mid back pain on right side 12/27/2020   SOB (shortness of breath) 12/27/2020   COVID-19 virus infection 12/27/2020   Pulmonary embolus ([place]) 12/27/2020   Bilateral pulmonary embolism ([place]) 12/27/2020   Need for zoster vaccination 05/29/2020   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   Seborrheic keratoses 02/21/2020   Cervical radiculopathy 02/18/2020   DDD (degenerative disc disease), cervical 02/18/2020   Mixed hyperlipidemia 02/18/2020   Memory changes 02/18/2020   Anhedonia 05/13/2017   Abnormal weight gain 05/13/2017   Elevated TSH 12/18/2016    No energy 12/17/2016   Toenail fungus 12/16/2016   Left shoulder pain 11/21/2016   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   Family history of atrial fibrillation 06/11/2016   Left lower quadrant abdominal pain 05/08/2016   History of diverticulitis 05/08/2016   Cervical nerve root impingement 04/11/2015   Hyperlipidemia 04/15/2014   History of bowel resection 04/14/2014   Testosterone deficiency 04/14/2014   DIARRHEA 03/14/2010   ABDOMINAL PAIN, GENERALIZED 03/14/2010   VARICES OF OTHER SITES 10/04/2009   IMPAIRED FASTING GLUCOSE 07/24/2009   PARONYCHIA, FINGER 07/13/2009   SKIN RASH 07/13/2009   ELEVATED PROSTATE SPECIFIC ANTIGEN 04/10/2009   CELLULITIS AND ABSCESS OF OTHER SPECIFIED SITE 05/09/2008   CHRONIC RHINITIS 06/04/2007   CONDYLOMA ACUMINATA 03/26/2007   HYPOGONADISM, MALE 11/04/2006   Depression, recurrent ([place]) 11/04/2006   ACNE ROSACEA 10/07/2006   LIBIDO, DECREASED 10/07/2006    History reviewed. No pertinent surgical history.     Family History  Problem Relation Age of Onset   Atrial fibrillation Father     Social History   Tobacco Use   Smoking status: Never   Smokeless tobacco: Never  Vaping Use   Vaping Use: Never used  Substance Use Topics   Alcohol use: Yes    Alcohol/week: 3.0 standard drinks    Types: 3 Glasses of wine per week    Comment: 4 ounces every couple of days   Drug use: No    Home Medications Prior to Admission medications   Medication Sig Start Date End Date Taking? Authorizing Provider  apixaban (ELIQUIS) 5 MG TABS tablet  Take 1 tablet (5 mg total) by mouth 2 (two) times daily. 01/03/21   Donella Stade, PA-C  APIXABAN (ELIQUIS) VTE STARTER PACK (10MG  AND 5MG ) Take as directed on package: start with two-5mg  tablets twice daily for 7 days. On day 8, switch to one-5mg  tablet twice daily. 12/29/20   Ghimire, Henreitta Leber, MD  B COMPLEX-C ER PO Take 1 tablet by mouth daily.    [provider]  Cholecalciferol (VITAMIN D3)  25 MCG (1000 UT) CAPS Take 500 Units by mouth daily.    [provider]  ezetimibe (ZETIA) 10 MG tablet Take 1 tablet (10 mg total) by mouth daily. Patient not taking: No sig reported 03/16/20   Iran Planas L, PA-C  HYDROcodone-acetaminophen (NORCO/VICODIN) 5-325 MG tablet Take 1 tablet by mouth every 6 (six) hours as needed for moderate pain.    [provider]  levothyroxine (EUTHYROX) 25 MCG tablet Take 1 tablet (25 mcg total) by mouth daily before breakfast. 08/24/20   Breeback, Jade L, PA-C  magnesium gluconate (MAGONATE) 500 MG tablet Take 500 mg by mouth daily.    [provider]  Multiple Vitamins-Minerals (ZINC PO) Take 7.5 mg by mouth daily.    [provider]  NONFORMULARY OR COMPOUNDED ITEM Place 1 drop into the left eye See admin instructions. Sterile/Compounded eyedrops (Prednisolone/Moxifloxacin/Bromfenac) Instill 1 drop into the left eye in the morning and at bedtime for 5 weeks    [provider]  Sildenafil Citrate (VIAGRA PO) Take by mouth.    [provider]  tamsulosin (FLOMAX) 0.4 MG CAPS capsule Take 0.4 mg by mouth at bedtime. 11/18/20   [provider]  Turmeric 500 MG CAPS Take 500 mg by mouth daily.    [provider]  Vilazodone HCl (VIIBRYD) 20 MG TABS Take 1 tablet (20 mg total) by mouth daily. 05/29/20   Orma Render, NP  VITAMIN A PO Take 375 mg by mouth daily.    [provider]  vitamin C (ASCORBIC ACID) 250 MG tablet Take 250 mg by mouth daily.    [provider]  zinc gluconate 50 MG tablet Take 50 mg by mouth daily.    [provider]    Allergies    Nsaids, Hydromorphone, Atorvastatin, Hydromorphone hcl, Rosuvastatin, and Zetia [ezetimibe]  Review of Systems   Review of Systems  Gastrointestinal:  Positive for abdominal pain.  All other systems reviewed and are negative.  Physical Exam Updated Vital Signs BP 109/83   Pulse 64   Temp 98.8 F (37.1 C)    Resp 19   SpO2 95%   Physical Exam Vitals and nursing note reviewed.  Constitutional:      Appearance: He is well-developed.  HENT:     Head: Normocephalic and atraumatic.  Cardiovascular:     Rate and Rhythm: Normal rate and regular rhythm.     Heart sounds: No murmur heard. Pulmonary:     Effort: Pulmonary effort is normal. No respiratory distress.     Breath sounds: Normal breath sounds.  Abdominal:     Palpations: Abdomen is soft.     Tenderness: There is no guarding or rebound.     Comments: Moderate LLQ tenderness  Musculoskeletal:        General: No tenderness.  Skin:    General: Skin is warm and dry.  Neurological:     Mental Status: He is alert and oriented to person, place, and time.  Psychiatric:  Behavior: Behavior normal.    ED Results / Procedures / Treatments   Labs (all labs ordered are listed, but only abnormal results are displayed) Labs Reviewed  COMPREHENSIVE METABOLIC PANEL - Abnormal; Notable for the following components:      Result Value   Albumin 3.3 (*)    All other components within normal limits  URINALYSIS, ROUTINE W REFLEX MICROSCOPIC - Abnormal; Notable for the following components:   APPearance HAZY (*)    Protein, ur 30 (*)    All other components within normal limits  LIPASE, BLOOD  CBC  LACTIC ACID, PLASMA  LACTIC ACID, PLASMA    EKG None  Radiology CT Abdomen Pelvis W Contrast  Result Date: 01/03/2021 CLINICAL DATA:  History of diverticulitis. Hyperactive bowel sounds. EXAM: CT ABDOMEN AND PELVIS WITH CONTRAST TECHNIQUE: Multidetector CT imaging of the abdomen and pelvis was performed using the standard protocol following bolus administration of intravenous contrast. CONTRAST:  131mL OMNIPAQUE IOHEXOL 300 MG/ML  SOLN COMPARISON:  05/10/2016 FINDINGS: Lower chest: Small right pleural effusion. Right basilar airspace disease. Hepatobiliary: No focal liver abnormality is seen. No gallstones, gallbladder wall thickening, or  biliary dilatation. Pancreas: Unremarkable. No pancreatic ductal dilatation or surrounding inflammatory changes. Spleen: Normal in size without focal abnormality. Adrenals/Urinary Tract: Normal adrenal glands. No urolithiasis or obstructive uropathy. Dystrophic left renal cortical calcification. Decompressed bladder. Stomach/Bowel: No bowel dilatation to suggest bowel obstruction. Bowel wall thickening of multiple small bowel loops in the left anterior abdomen. 4.4 x 4.8 x 4 cm complex fluid collection interspersed within the bowel loops in the left mid abdomen with air in the collection most consistent with an abscess. No pneumatosis, pneumoperitoneum or portal venous gas. Vascular/Lymphatic: Normal caliber abdominal aorta with mild atherosclerosis. No lymphadenopathy. Reproductive: Prostate is unremarkable. Other: Anterior abdominal wall hernia repair. Small right spigelian hernia of the right without incarceration. Musculoskeletal: Degenerative disease with disc height loss at L4-5 and L5-S1 with bilateral facet arthropathy. No acute osseous abnormality. No aggressive osseous lesion. IMPRESSION: 1. Bowel wall thickening of multiple small bowel loops in the left anterior abdomen. 4.4 x 4.8 x 4 cm abscess between the small bowel loops in the left mid abdomen. No pneumatosis, pneumoperitoneum or portal venous gas. No bowel obstruction. 2.  Aortic Atherosclerosis (ICD10-I70.0). Electronically Signed   By: Kathreen Devoid   On: 01/03/2021 18:33    Procedures Procedures   Medications Ordered in ED Medications  piperacillin-tazobactam (ZOSYN) IVPB 3.375 g (has no administration in time range)    ED Course  I have reviewed the triage vital signs and the nursing notes.  Pertinent labs & imaging results that were available during my care of the patient were reviewed by me and considered in my medical decision making (see chart for details).    MDM Rules/Calculators/A&P                         patient with  history of recent COVID-19 infection and pulmonary embolism as well as history of diverticulitis here for evaluation of diverticular abscess on outpatient imaging. He does have tenderness on examination. Labs are reassuring. Will start on antibiotics. General surgery has been consulted with recommendation for IR management. Medicine consulted for admission.   Final Clinical Impression(s) / ED Diagnoses Final diagnoses:  Colonic diverticular abscess    Rx / DC Orders ED Discharge Orders     None        Quintella Reichert, MD 01/04/21 1057

## 2021-01-04 NOTE — Progress Notes (Signed)
Pt is admitted into ED for left lower quadrant abdominal abscess. No major concerns.

## 2021-01-04 NOTE — ED Notes (Signed)
No further lactic acid as first was normal per EDP

## 2021-01-04 NOTE — Progress Notes (Signed)
FYI, this is the patient I consulted with you about yesterday. It was a good decision. THANKs for help.

## 2021-01-04 NOTE — H&P (Addendum)
History and Physical    Marc Lowe QPR:[id number] DOB: Apr 11, 1951 DOA: 01/04/2021  PCP: Donella Stade, PA-C Consultants:  Junious Silk - urology; Quentin Cornwall - GI Surgery; Prasad/Katopes - GI; Lorin Mercy - orthopedics Patient coming from:  Home - lives with wife; NOK: Wife, Marc Lowe, [phone]   Chief Complaint: Abdominal pain  HPI: Marc Lowe is a 70 y.o. male with medical history significant of depression and diverticulitis s/p colectomy presenting with abdominal pain.  He was previously hospitalized from 6/15-17 with prior COVID infection -> DVT/PE and discharged on Eliquis.  He was also recently treated with Augmentin for diverticulitis.   He developed COVID symptoms on 6/3, his wife tested positive on 6/5.  He had abdominal pain on 6/5 and went to the ER; CT was negative and he was discharged with Pepcid.  He "knew it was diverticulitis."  They did a virtual PCP visit and was given Flagyl and Cipro with some improvement.  They had a f/u visit on 6/15 and he was admitted for PE.  His abdominal pain was better and antibiotics were discontinued.  Abdominal pain restarted on "Sunday 6/19 with localized pain in LLQ and it has grown in discomfort.  They did an in-person visit with the PA yesterday and she ordered a CT which was done last night.  They called last night and encouraged them to come to the ER.  As long as he doesn't move, it is fine.  It is very painful to touch or movement and he is very fatigued with no energy.  He has never gotten his energy back since June 1. No respiratory symptoms.  Tmax 100 last week.  In 2005, he had perforated diverticulitis.  He had ex lap with lysis of adhesions, L colon resection to rectosigmoid with EEA anastomosis, drainage of peritoneal abscess, appy, omentectomy, repair of incarcerated umbilical hernia, proctosigmoidoscopy, and pain pump placement.    ED Course: h/o diverticulitis with partial bowel resection.  Recent COVID -> PE, treated with  Eliquis.  Yesterday saw PCP due to abdominal pain, has an abscess.  Has had a recent partial course of Augmentin.  Surgery recommends IR drainage.  Given Zosyn.    Review of Systems: As per HPI; otherwise review of systems reviewed and negative.   Ambulatory Status:  Ambulates without assistance  COVID Vaccine Status:   Complete  Past Medical History:  Diagnosis Date   BPH (benign prostatic hyperplasia)    Depression    Diverticulitis    Hypothyroidism (acquired)    Pulmonary emboli (HCC)     Past Surgical History:  Procedure Laterality Date   COLON RESECTION SIGMOID     20" 05    Social History   Socioeconomic History   Marital status: Married    Spouse name: Tillman Abide   Number of children: 4   Years of education: 14   Highest education level: Bachelor's degree (e.g., BA, AB, BS)  Occupational History   Occupation: Chief Strategy Officer    Comment: retired  Tobacco Use   Smoking status: Never   Smokeless tobacco: Never  Vaping Use   Vaping Use: Never used  Substance and Sexual Activity   Alcohol use: Yes    Alcohol/week: 3.0 standard drinks    Types: 3 Glasses of wine per week    Comment: 4 ounces every couple of days   Drug use: No   Sexual activity: Yes    Partners: Female  Other Topics Concern   Not on file  Social History Narrative  Plays golf once a week. 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, dispensing of Radio broadcast assistant Strain: Low Risk    Difficulty of Paying Living Expenses: Not hard at all  Food Insecurity: No Food Insecurity   Worried About Charity fundraiser in the Last Year: Never true   Arboriculturist in the Last Year: Never true  Transportation Needs: No Transportation Needs   Lack of Transportation (Medical): No   Lack of Transportation (Non-Medical): No  Physical Activity: Sufficiently Active   Days of Exercise per Week: 5 days   Minutes of Exercise per Session: 100 min  Stress: No Stress Concern Present    Feeling of Stress : Not at all  Social Connections: Moderately Integrated   Frequency of Communication with Friends and Family: More than three times a week   Frequency of Social Gatherings with Friends and Family: More than three times a week   Attends Religious Services: Never   Marine scientist or Organizations: Yes   Attends Music therapist: More than 4 times per year   Marital Status: Married  Human resources officer Violence: Not At Risk   Fear of Current or Ex-Partner: No   Emotionally Abused: No   Physically Abused: No   Sexually Abused: No    Allergies  Allergen Reactions   Nsaids Other (See Comments)    SHOULD NOT HAVE THIS CLASS OF MEDICATION- HISTORY OF DIVERTICULITIS   Hydromorphone Nausea And Vomiting   Atorvastatin Nausea Only and Other (See Comments)    Abdominal pain and reflux, also   Hydromorphone Hcl Nausea And Vomiting   Rosuvastatin Nausea And Vomiting   Zetia [Ezetimibe] Nausea Only and Other (See Comments)    Caused an upset stomach and made patient's body hurt    Family History  Problem Relation Age of Onset   Atrial fibrillation Father     Prior to Admission medications   Medication Sig Start Date End Date Taking? Authorizing Provider  apixaban (ELIQUIS) 5 MG TABS tablet Take 1 tablet (5 mg total) by mouth 2 (two) times daily. 01/03/21   Donella Stade, PA-C  APIXABAN (ELIQUIS) VTE STARTER PACK (10MG  AND 5MG ) Take as directed on package: start with two-5mg  tablets twice daily for 7 days. On day 8, switch to one-5mg  tablet twice daily. 12/29/20   Ghimire, Henreitta Leber, MD  B COMPLEX-C ER PO Take 1 tablet by mouth daily.    [provider]  Cholecalciferol (VITAMIN D3) 25 MCG (1000 UT) CAPS Take 500 Units by mouth daily.    [provider]  ezetimibe (ZETIA) 10 MG tablet Take 1 tablet (10 mg total) by mouth daily. Patient not taking: No sig reported 03/16/20   Iran Planas L, PA-C  HYDROcodone-acetaminophen (NORCO/VICODIN)  5-325 MG tablet Take 1 tablet by mouth every 6 (six) hours as needed for moderate pain.    [provider]  levothyroxine (EUTHYROX) 25 MCG tablet Take 1 tablet (25 mcg total) by mouth daily before breakfast. 08/24/20   Breeback, Jade L, PA-C  magnesium gluconate (MAGONATE) 500 MG tablet Take 500 mg by mouth daily.    [provider]  Multiple Vitamins-Minerals (ZINC PO) Take 7.5 mg by mouth daily.    [provider]  NONFORMULARY OR COMPOUNDED ITEM Place 1 drop into the left eye See admin instructions. Sterile/Compounded eyedrops (Prednisolone/Moxifloxacin/Bromfenac) Instill 1 drop into the left eye in the morning and at bedtime for 5 weeks  [provider]  Sildenafil Citrate (VIAGRA PO) Take by mouth.    [provider]  tamsulosin (FLOMAX) 0.4 MG CAPS capsule Take 0.4 mg by mouth at bedtime. 11/18/20   [provider]  Turmeric 500 MG CAPS Take 500 mg by mouth daily.    [provider]  Vilazodone HCl (VIIBRYD) 20 MG TABS Take 1 tablet (20 mg total) by mouth daily. 05/29/20   Orma Render, NP  VITAMIN A PO Take 375 mg by mouth daily.    [provider]  vitamin C (ASCORBIC ACID) 250 MG tablet Take 250 mg by mouth daily.    [provider]  zinc gluconate 50 MG tablet Take 50 mg by mouth daily.    [provider]    Physical Exam: Vitals:   01/04/21 1300 01/04/21 1400 01/04/21 1430 01/04/21 1444  BP: 109/76 97/68 109/79   Pulse: (!) 59 65 67   Resp: 16 10 16    Temp:    99 F (37.2 C)  TempSrc:    Oral  SpO2: 96% 98% 94%      General:  Appears calm and comfortable and is in NAD, mildly to moderately ill-appearing Eyes:  PERRL, EOMI, normal lids, iris ENT:  grossly normal hearing, lips & tongue, mmm; appropriate dentition Neck:  no LAD, masses or thyromegaly Cardiovascular:  RRR, no m/r/g. No LE edema.  Respiratory:   CTA bilaterally with no wheezes/rales/rhonchi.  Normal respiratory  effort. Abdomen:  soft, +LLQ TTP, ND, NABS Back:   normal alignment, no CVAT Skin:  no rash or induration seen on limited exam Musculoskeletal:  grossly normal tone BUE/BLE, good ROM, no bony abnormality Psychiatric:  grossly normal mood and affect, speech fluent and appropriate, AOx3 Neurologic:  CN 2-12 grossly intact, moves all extremities in coordinated fashion    Radiological Exams on Admission: Independently reviewed - see discussion in A/P where applicable  CT Abdomen Pelvis W Contrast  Result Date: 01/03/2021 CLINICAL DATA:  History of diverticulitis. Hyperactive bowel sounds. EXAM: CT ABDOMEN AND PELVIS WITH CONTRAST TECHNIQUE: Multidetector CT imaging of the abdomen and pelvis was performed using the standard protocol following bolus administration of intravenous contrast. CONTRAST:  181mL OMNIPAQUE IOHEXOL 300 MG/ML  SOLN COMPARISON:  05/10/2016 FINDINGS: Lower chest: Small right pleural effusion. Right basilar airspace disease. Hepatobiliary: No focal liver abnormality is seen. No gallstones, gallbladder wall thickening, or biliary dilatation. Pancreas: Unremarkable. No pancreatic ductal dilatation or surrounding inflammatory changes. Spleen: Normal in size without focal abnormality. Adrenals/Urinary Tract: Normal adrenal glands. No urolithiasis or obstructive uropathy. Dystrophic left renal cortical calcification. Decompressed bladder. Stomach/Bowel: No bowel dilatation to suggest bowel obstruction. Bowel wall thickening of multiple small bowel loops in the left anterior abdomen. 4.4 x 4.8 x 4 cm complex fluid collection interspersed within the bowel loops in the left mid abdomen with air in the collection most consistent with an abscess. No pneumatosis, pneumoperitoneum or portal venous gas. Vascular/Lymphatic: Normal caliber abdominal aorta with mild atherosclerosis. No lymphadenopathy. Reproductive: Prostate is unremarkable. Other: Anterior abdominal wall hernia repair. Small right  spigelian hernia of the right without incarceration. Musculoskeletal: Degenerative disease with disc height loss at L4-5 and L5-S1 with bilateral facet arthropathy. No acute osseous abnormality. No aggressive osseous lesion. IMPRESSION: 1. Bowel wall thickening of multiple small bowel loops in the left anterior abdomen. 4.4 x 4.8 x 4 cm abscess between the small bowel loops in the left mid abdomen. No pneumatosis, pneumoperitoneum or portal venous gas. No bowel obstruction. 2.  Aortic Atherosclerosis (ICD10-I70.0). Electronically Signed   By: Kathreen Devoid   On: 01/03/2021 18:33    EKG: not done   Labs on Admission: I have personally reviewed the available labs and imaging studies at the time of the admission.  Pertinent labs:   Unremarkable CMP Lactate 1.7 Normal CBC UA: 30 protein   Assessment/Plan Principal Problem:   Colonic diverticular abscess Active Problems:   Depression, recurrent (HCC)   Hyperlipidemia   Pulmonary embolus (HCC)   History of COVID-19   Hypothyroidism (acquired)   Diverticular abscess -Patient with extensive prior h/o diverticulitis with perforation requiring temporary ostomy and significant bowel resection in 2005 -Now presenting with progressive abdominal pain despite Cipro/Flagyl and so had abd CT that shows an abscess -Surgery has consulted and recommends IR drainage -I have contacted IR and they will plan to place drain tomorrow (6/24) -He is likely to need to remain in the hospital for IV antibiotics through 6/25 or 6/26 -Will admit to med surg -Continue Zosyn for now  Recent PE -Provoked by COVID-19 infection -Started on Eliquis and still on transition dose pack -Last dose was 10 mg last night about 2200 (on 6/22) -Will hold Eliquis and start Heparin drip -Heparin will need to be turned off about 4 hours prior to procedure -Post-procedure, it is likely reasonable to resume Eliquis (or, alternatively, this can be resumed on the day of  d/c)  Recent COVID-19 infection -He does not have any symptoms related to COVID-19 other than possibly fatigue -Symptoms started on 6/1 -Should not need further isolation   Hypothyroidism -Continue Synthroid   HLD -Continue Zetia   Anxiety/depression -Continue Viibryd  BPH -Continue Flomax   Note: This patient was positive for COVID within the past 90 days and does not require further isolation. The patient has been fully vaccinated against COVID-19.   Level of care: Med-Surg DVT prophylaxis: Heparin Code Status:  Full - confirmed with patient/family Family Communication: Wife was present throughout evaluation Disposition Plan:  The patient is from: home  Anticipated d/c is to: home without [facility] services   Anticipated d/c date will depend on clinical response to treatment, likely 2-3 days  Patient is currently: acutely ill Consults called: Surgery; IR  Admission status:  Admit - It is my clinical opinion that admission to INPATIENT is reasonable and necessary because of the expectation that this patient will require hospital care that crosses at least 2 midnights to treat this condition based on the medical complexity of the problems presented.  Given the aforementioned information, the predictability of an adverse outcome is felt to be significant.    Karmen Bongo MD [facility]   How to contact the [facility] Attending or Consulting provider [place] or covering provider during after hours [place], for this patient?  Check the care team in [facility] and look for a) attending/consulting [facility] provider listed and b) the [facility] team listed Log into [email or web address] and use [facility]'s universal password to access. If you do not have the password, please contact the hospital operator. Locate the [facility] provider you are looking for under [facility] and page to a number that you can be directly reached. If you still have difficulty reaching the provider, please page the [facility] (Director  on Call) for the Hospitalists listed on amion for assistance.   01/04/2021, 3:37 PM

## 2021-01-04 NOTE — Consult Note (Signed)
Chief Complaint: Patient was seen in consultation today for diverticular abscess aspiration/drain placement.  Referring Physician(s): Saverio Danker, PA-C  Supervising Physician: Arne Cleveland  Patient Status: [name] - ED  History of Present Illness: Marc Lowe is a 70 y.o. male with a past medical history significant for depression, hypothyroidism, BPH, diverticulitis and PE on Eliquis who presented to [facility] ED today after results of an outpatient CT scan noted a LLQ abscess. Marc Lowe was diagnosed with COVID on 6/3 and shortly after developed LLQ pain which was similar to previous episodes of diverticulitis, he was placed on antibiotics by his PCP and began to geel better. Of note, he had previously undergone a surgical resection which ended up requiring a colostomy with subsequent takedown and mesh placement in [place] in 2005. On 6/15 he developed worsening dyspnea and was found to have a PE, he was admitted from 6/15-6/17 and his antibiotics were stopped during that admission as he was feeling better. This last weekend his abdominal pain returned and was worse than before so he was sent for a CT scan which showed a LLQ 4.4 x 4.8 x 4 cm abscess between the small bowel loops in the left mid abdomen and he was instructed to present to the ED for further evaluation. General surgery was consulted and has recommended percutaneous drain placement in IR.   Patient seen in the ED, he reports fatigue and mild abdominal pain which has improved. He tells me he had an abdominal drain for an abscess about 15 years ago and this was very painful when it was removed. He had been feeling well until this last weekend and was eating/toileting as usual. He has been compliant with his Eliquis and last took it yesterday around 9 pm. He understands the procedure today and is agreeable to proceed.  Past Medical History:  Diagnosis Date  . BPH (benign prostatic hyperplasia)   . Depression   . Diverticulitis    . Hypothyroidism (acquired)   . Pulmonary emboli [facility])     Past Surgical History:  Procedure Laterality Date  . COLON RESECTION SIGMOID     2005    Allergies: Nsaids, Hydromorphone, Atorvastatin, Hydromorphone hcl, Rosuvastatin, and Zetia [ezetimibe]  Medications: Prior to Admission medications   Medication Sig Start Date End Date Taking? Authorizing Provider  apixaban (ELIQUIS) 5 MG TABS tablet Take 1 tablet (5 mg total) by mouth 2 (two) times daily. 01/03/21  Yes Breeback, Jade L, PA-C  APIXABAN (ELIQUIS) VTE STARTER PACK (10MG  AND 5MG ) Take as directed on package: start with two-5mg  tablets twice daily for 7 days. On day 8, switch to one-5mg  tablet twice daily. 12/29/20  Yes Ghimire, Henreitta Leber, MD  B COMPLEX-C ER PO Take 1 tablet by mouth daily.   Yes [provider]  Cholecalciferol (VITAMIN D3) 25 MCG (1000 UT) CAPS Take 500 Units by mouth daily.   Yes [provider]  levothyroxine (EUTHYROX) 25 MCG tablet Take 1 tablet (25 mcg total) by mouth daily before breakfast. 08/24/20  Yes Breeback, Jade L, PA-C  magnesium gluconate (MAGONATE) 500 MG tablet Take 500 mg by mouth daily.   Yes [provider]  Multiple Vitamins-Minerals (ZINC PO) Take 7.5 mg by mouth daily.   Yes [provider]  NONFORMULARY OR COMPOUNDED ITEM Place 1 drop into the left eye See admin instructions. Sterile/Compounded eyedrops (Prednisolone/Moxifloxacin/Bromfenac) Instill 1 drop into the left eye in the morning and at bedtime for 5 weeks   Yes [provider]  Sildenafil Citrate (VIAGRA PO) Take by mouth.   Yes [provider]  tamsulosin (FLOMAX) 0.4 MG CAPS capsule Take 0.4 mg by mouth at bedtime. 11/18/20  Yes [provider]  Turmeric 500 MG CAPS Take 500 mg by mouth daily.   Yes [provider]  Vilazodone HCl (VIIBRYD) 20 MG TABS Take 1 tablet (20 mg total) by mouth daily. 05/29/20  Yes Early, Coralee Pesa, NP  VITAMIN A PO Take 375 mg by mouth  daily.   Yes [provider]  vitamin C (ASCORBIC ACID) 250 MG tablet Take 250 mg by mouth daily.   Yes [provider]  ezetimibe (ZETIA) 10 MG tablet Take 1 tablet (10 mg total) by mouth daily. Patient not taking: No sig reported 03/16/20 01/04/21  Donella Stade, PA-C     Family History  Problem Relation Age of Onset  . Atrial fibrillation Father     Social History   Socioeconomic History  . Marital status: Married    Spouse name: Tillman Abide  . Number of children: 4  . Years of education: 42  . Highest education level: Bachelor's degree (e.g., BA, AB, BS)  Occupational History  . Occupation: Chief Strategy Officer    Comment: retired  Tobacco Use  . Smoking status: Never  . Smokeless tobacco: Never  Vaping Use  . Vaping Use: Never used  Substance and Sexual Activity  . Alcohol use: Yes    Alcohol/week: 3.0 standard drinks    Types: 3 Glasses of wine per week    Comment: 4 ounces every couple of days  . Drug use: No  . Sexual activity: Yes    Partners: Female  Other Topics Concern  . Not on file  Social History Narrative   Plays golf once a week. 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 Difficulty of Paying Living Expenses: Not hard at all  Food Insecurity: No Food Insecurity  . Worried About Charity fundraiser in the Last Year: Never true  . Ran Out of Food in the Last Year: Never true  Transportation Needs: No Transportation Needs  . Lack of Transportation (Medical): No  . Lack of Transportation (Non-Medical): No  Physical Activity: Sufficiently Active  . Days of Exercise per Week: 5 days  . Minutes of Exercise per Session: 100 min  Stress: No Stress Concern Present  . Feeling of Stress : Not at all  Social Connections: Moderately Integrated  . Frequency of Communication with Friends and Family: More than three times a week  . Frequency of Social Gatherings with Friends  and Family: More than three times a week  . Attends Religious Services: Never  . Active Member of Clubs or Organizations: Yes  . Attends Archivist Meetings: More than 4 times per year  . Marital Status: Married     Review of Systems: A 12 point ROS discussed and pertinent positives are indicated in the HPI above.  All other systems are negative.  Review of Systems  Constitutional:  Positive for fatigue. Negative for chills and fever.  Respiratory:  Negative for cough and shortness of breath.   Cardiovascular:  Negative for chest pain.  Gastrointestinal:  Positive for abdominal pain. Negative for blood in stool, diarrhea, nausea and vomiting.  Musculoskeletal:  Negative for back pain.  Neurological:  Negative for dizziness and headaches.   Vital Signs: BP 109/79   Pulse 67  Temp 99 F (37.2 C) (Oral)   Resp 16   SpO2 94%   Physical Exam Vitals and nursing note reviewed.  Constitutional:      General: He is not in acute distress.    Appearance: He is not ill-appearing.  HENT:     Head: Normocephalic.     Mouth/Throat:     Mouth: Mucous membranes are moist.     Pharynx: Oropharynx is clear. No oropharyngeal exudate or posterior oropharyngeal erythema.  Cardiovascular:     Rate and Rhythm: Normal rate and regular rhythm.  Pulmonary:     Effort: Pulmonary effort is normal.     Breath sounds: Normal breath sounds.  Abdominal:     General: There is no distension.     Palpations: Abdomen is soft.     Tenderness: There is abdominal tenderness (LLQ).  Skin:    General: Skin is warm and dry.     Coloration: Skin is not jaundiced.  Neurological:     Mental Status: He is alert and oriented to person, place, and time.  Psychiatric:        Mood and Affect: Mood normal.        Behavior: Behavior normal.        Thought Content: Thought content normal.        Judgment: Judgment normal.     MD Evaluation Airway: WNL Heart: WNL Abdomen: WNL Chest/ Lungs: WNL ASA   Classification: 2 Mallampati/Airway Score: One   Imaging: CT Angio Chest W/Cm &/Or Wo Cm  Result Date: 12/27/2020 CLINICAL DATA:  Increasing shortness of breath over the past few weeks, initial encounter EXAM: CT ANGIOGRAPHY CHEST WITH CONTRAST TECHNIQUE: Multidetector CT imaging of the chest was performed using the standard protocol during bolus administration of intravenous contrast. Multiplanar CT image reconstructions and MIPs were obtained to evaluate the vascular anatomy. CONTRAST:  175mL OMNIPAQUE IOHEXOL 350 MG/ML SOLN COMPARISON:  None. FINDINGS: Cardiovascular: Thoracic aorta and its branches demonstrate mild atherosclerotic calcification. No cardiac enlargement is seen. Pulmonary artery demonstrates multiple filling defects primarily on the right consistent with pulmonary emboli. No significant right heart strain is identified. Mild coronary calcifications are seen. Mediastinum/Nodes: Thoracic inlet is within normal limits. No sizable hilar or mediastinal adenopathy is noted. The esophagus as visualized is within normal limits. Lungs/Pleura: Small right pleural effusion is noted. Mild bibasilar atelectatic changes are seen slightly worse on the right than the left likely related to the underlying pulmonary emboli. No sizable parenchymal nodule is seen. Upper Abdomen: Visualized upper abdomen is within normal limits. Musculoskeletal: Degenerative changes of the thoracic spine are noted. Review of the MIP images confirms the above findings. IMPRESSION: Bilateral pulmonary emboli right greater than left with associated atelectatic changes in the bases as well as a small right pleural effusion. No right heart strain is seen. Aortic Atherosclerosis (ICD10-I70.0). These results will be called to the ordering clinician or representative by the Radiologist Assistant, and communication documented in the PACS or [employer]. Electronically Signed   By: Inez Catalina M.D.   On: 12/27/2020 11:00   CT  Abdomen Pelvis W Contrast  Result Date: 01/03/2021 CLINICAL DATA:  History of diverticulitis. Hyperactive bowel sounds. EXAM: CT ABDOMEN AND PELVIS WITH CONTRAST TECHNIQUE: Multidetector CT imaging of the abdomen and pelvis was performed using the standard protocol following bolus administration of intravenous contrast. CONTRAST:  160mL OMNIPAQUE IOHEXOL 300 MG/ML  SOLN COMPARISON:  05/10/2016 FINDINGS: Lower chest: Small right pleural effusion. Right basilar airspace disease. Hepatobiliary: No focal  liver abnormality is seen. No gallstones, gallbladder wall thickening, or biliary dilatation. Pancreas: Unremarkable. No pancreatic ductal dilatation or surrounding inflammatory changes. Spleen: Normal in size without focal abnormality. Adrenals/Urinary Tract: Normal adrenal glands. No urolithiasis or obstructive uropathy. Dystrophic left renal cortical calcification. Decompressed bladder. Stomach/Bowel: No bowel dilatation to suggest bowel obstruction. Bowel wall thickening of multiple small bowel loops in the left anterior abdomen. 4.4 x 4.8 x 4 cm complex fluid collection interspersed within the bowel loops in the left mid abdomen with air in the collection most consistent with an abscess. No pneumatosis, pneumoperitoneum or portal venous gas. Vascular/Lymphatic: Normal caliber abdominal aorta with mild atherosclerosis. No lymphadenopathy. Reproductive: Prostate is unremarkable. Other: Anterior abdominal wall hernia repair. Small right spigelian hernia of the right without incarceration. Musculoskeletal: Degenerative disease with disc height loss at L4-5 and L5-S1 with bilateral facet arthropathy. No acute osseous abnormality. No aggressive osseous lesion. IMPRESSION: 1. Bowel wall thickening of multiple small bowel loops in the left anterior abdomen. 4.4 x 4.8 x 4 cm abscess between the small bowel loops in the left mid abdomen. No pneumatosis, pneumoperitoneum or portal venous gas. No bowel obstruction. 2.   Aortic Atherosclerosis (ICD10-I70.0). Electronically Signed   By: Kathreen Devoid   On: 01/03/2021 18:33   US Venous Img Lower Bilateral  Result Date: 12/27/2020 CLINICAL DATA:  Recent diagnosis of bilateral pulmonary emboli. EXAM: BILATERAL LOWER EXTREMITY VENOUS DOPPLER ULTRASOUND TECHNIQUE: Gray-scale sonography with compression, as well as color and duplex ultrasound, were performed to evaluate the deep venous system(s) from the level of the common femoral vein through the popliteal and proximal calf veins. COMPARISON:  07/02/2019 FINDINGS: VENOUS On the right, normal compressibility of the common femoral, superficial femoral, and popliteal veins, as well as the visualized calf veins. Visualized portions of profunda femoral vein and great saphenous vein unremarkable. No filling defects to suggest DVT on grayscale or color Doppler imaging. Doppler waveforms show normal direction of venous flow, normal respiratory phasicity and response to augmentation. On the left, normal compressibility of the common femoral, and femoral vein. Saphenofemoral junction patent. There is hypoechoic noncompressible thrombus in the popliteal vein with a small amount of persistent antegrade signal on Doppler and color Doppler. There is partially occlusive thrombus in the gastric vein, and occlusive thrombus in the posterior tibial vein. OTHER None. Limitations: none IMPRESSION: 1. POSITIVE for left posterior tibial, gastric, and popliteal DVT. 2. Negative for right lower extremity DVT. Electronically Signed   By: Lucrezia Europe M.D.   On: 12/27/2020 13:41   ECHOCARDIOGRAM COMPLETE  Result Date: 12/28/2020    ECHOCARDIOGRAM REPORT   Patient Name:   BRENDA COWHER Date of Exam: 12/28/2020 Medical Rec #:  [id number]        Height:       74.0 in Accession #:    [id number]       Weight:       228.0 lb Date of Birth:  06/28/1951       BSA:          2.298 m Patient Age:    6 years         BP:           114/72 mmHg Patient Gender: M                 HR:           59 bpm. Exam Location:  Inpatient Procedure: 2D Echo, Cardiac Doppler and Color Doppler Indications:    I26.02  Pulmonary embolus  History:        Patient has no prior history of Echocardiogram examinations.                 COVID-19 Positive.  Sonographer:    Jonelle Sidle Dance Referring Phys: [id number] Lequita Halt  Sonographer Comments: No subcostal window. IMPRESSIONS  1. Left ventricular ejection fraction, by estimation, is 55 to 60%. The left ventricle has normal function. The left ventricle has no regional wall motion abnormalities. There is mild concentric left ventricular hypertrophy. Left ventricular diastolic parameters are indeterminate.  2. Right ventricular systolic function is normal. The right ventricular size is normal.  3. The mitral valve is normal in structure. Trivial mitral valve regurgitation. No evidence of mitral stenosis.  4. The aortic valve is normal in structure. Aortic valve regurgitation is trivial. No aortic stenosis is present.  5. Aortic dilatation noted. There is mild dilatation of the aortic root, measuring 43 mm. There is borderline dilatation of the ascending aorta, measuring 37 mm. FINDINGS  Left Ventricle: Left ventricular ejection fraction, by estimation, is 55 to 60%. The left ventricle has normal function. The left ventricle has no regional wall motion abnormalities. The left ventricular internal cavity size was normal in size. There is  mild concentric left ventricular hypertrophy. Left ventricular diastolic parameters are indeterminate. Normal left ventricular filling pressure. Right Ventricle: The right ventricular size is normal. No increase in right ventricular wall thickness. Right ventricular systolic function is normal. Left Atrium: Left atrial size was normal in size. Right Atrium: Right atrial size was normal in size. Pericardium: There is no evidence of pericardial effusion. Mitral Valve: The mitral valve is normal in structure. Trivial mitral valve  regurgitation. No evidence of mitral valve stenosis. Tricuspid Valve: The tricuspid valve is normal in structure. Tricuspid valve regurgitation is trivial. No evidence of tricuspid stenosis. Aortic Valve: The aortic valve is normal in structure. Aortic valve regurgitation is trivial. No aortic stenosis is present. Pulmonic Valve: The pulmonic valve was normal in structure. Pulmonic valve regurgitation is trivial. No evidence of pulmonic stenosis. Aorta: Aortic dilatation noted. There is mild dilatation of the aortic root, measuring 43 mm. There is borderline dilatation of the ascending aorta, measuring 37 mm. Venous: The inferior vena cava was not well visualized. IAS/Shunts: No atrial level shunt detected by color flow Doppler.  LEFT VENTRICLE PLAX 2D LVIDd:         4.40 cm  Diastology LVIDs:         2.70 cm  LV e' medial:    5.98 cm/s LV PW:         1.40 cm  LV E/e' medial:  11.9 LV IVS:        1.30 cm  LV e' lateral:   7.07 cm/s LVOT diam:     2.00 cm  LV E/e' lateral: 10.1 LV SV:         77 LV SV Index:   33 LVOT Area:     3.14 cm  RIGHT VENTRICLE RV Basal diam:  3.20 cm RV Mid diam:    2.10 cm RV S prime:     12.80 cm/s TAPSE (M-mode): 2.0 cm LEFT ATRIUM           Index       RIGHT ATRIUM           Index LA diam:      3.20 cm 1.39 cm/m  RA Area:     21.10 cm LA Vol (A2C):  77.3 ml 33.64 ml/m RA Volume:   63.50 ml  27.63 ml/m LA Vol (A4C): 77.3 ml 33.64 ml/m  AORTIC VALVE LVOT Vmax:   107.00 cm/s LVOT Vmean:  74.600 cm/s LVOT VTI:    0.245 m  AORTA Ao Root diam: 4.30 cm Ao Asc diam:  3.70 cm MITRAL VALVE               TRICUSPID VALVE MV Area (PHT): 4.46 cm    TR Peak grad:   17.5 mmHg MV Decel Time: 170 msec    TR Vmax:        209.00 cm/s MV E velocity: 71.10 cm/s MV A velocity: 52.30 cm/s  SHUNTS MV E/A ratio:  1.36        Systemic VTI:  0.24 m                            Systemic Diam: 2.00 cm Fransico Him MD Electronically signed by Fransico Him MD Signature Date/Time: 12/28/2020/2:59:59 PM    Final      Labs:  CBC: Recent Labs    12/28/20 0127 12/29/20 0032 01/03/21 0000 01/04/21 0740  WBC 8.8 7.9 7.8 7.6  HGB 13.1 13.0 14.6 14.5  HCT 39.3 37.7* 44.6 44.7  PLT 212 246 450* 378    COAGS: Recent Labs    01/04/21 1435  INR 1.4*  APTT 37*    BMP: Recent Labs    03/14/20 0856 05/29/20 0000 12/27/20 1105 01/03/21 0000 01/04/21 0740  NA 141 140 131* 137 137  K 4.7 4.8 3.9 4.5 4.4  CL 105 105 97* 100 102  CO2 25 28 27 28 23   GLUCOSE 83 101* 123* 105* 98  BUN 19 15 14 15 10   CALCIUM 9.3 9.4 8.2* 9.1 9.3  CREATININE 1.00 0.89 1.02 0.88 0.89  GFRNONAA 78 88 >60 88 >60  GFRAA 90 103  --  102  --     LIVER FUNCTION TESTS: Recent Labs    05/29/20 0000 12/27/20 1105 01/03/21 0000 01/04/21 0740  BILITOT 0.4 0.5 0.5 0.9  AST 24 16 20 26   ALT 33 25 30 38  ALKPHOS  --  51  --  64  PROT 6.6 6.4* 6.8 7.4  ALBUMIN  --  3.0*  --  3.3*    TUMOR MARKERS: No results for input(s): AFPTM, CEA, CA199, CHROMGRNA in the last 8760 hours.  Assessment and Plan:  70 y/o M with history of diverticulitis s/p resection with subsequent need for a colostomy with later takedown and mesh placement in [place] in 2005 who presented to the ED after CT scan showed LLQ abscess. IR has been consulted for percutaneous aspiration and drain placement. Patient history and imaging reviewed by Dr. Vernard Gambles who approves patient for procedure.  Plan for drain placement tomorrow in CT with moderate sedation. Patient to be NPO at midnight, continue to hold Eliquis, heparin gtt will be held 4 hours prior to procedure (this will be coordinated by IR RN).   Risks and benefits discussed with the patient including bleeding, infection, damage to adjacent structures, bowel perforation/fistula connection, and sepsis.  All of the patient's questions were answered, patient is agreeable to proceed.  Consent signed and in chart.   Thank you for this interesting consult.  I greatly enjoyed meeting KAELOB PERSKY and look forward to participating in their care.  A copy of this report was sent to the requesting provider on this  date.  Electronically Signed: Joaquim Nam, PA-C 01/04/2021, 4:02 PM   I spent a total of 40 Minutes in face to face in clinical consultation, greater than 50% of which was counseling/coordinating care for LLQ abdominal abscess drain placement.

## 2021-01-04 NOTE — ED Triage Notes (Signed)
Pt here with reports of abdominal pain X1 week. Had a ct scan yesterday which showed an abscess and was told to come here for admission. Denies fevers.

## 2021-01-05 ENCOUNTER — Telehealth: Payer: Medicare Other

## 2021-01-05 ENCOUNTER — Inpatient Hospital Stay (HOSPITAL_COMMUNITY): Payer: Medicare Other

## 2021-01-05 LAB — CBC
HCT: 38 % — ABNORMAL LOW (ref 39.0–52.0)
Hemoglobin: 12.9 g/dL — ABNORMAL LOW (ref 13.0–17.0)
MCH: 29.4 pg (ref 26.0–34.0)
MCHC: 33.9 g/dL (ref 30.0–36.0)
MCV: 86.6 fL (ref 80.0–100.0)
Platelets: 296 10*3/uL (ref 150–400)
RBC: 4.39 MIL/uL (ref 4.22–5.81)
RDW: 13.2 % (ref 11.5–15.5)
WBC: 5.6 10*3/uL (ref 4.0–10.5)
nRBC: 0 % (ref 0.0–0.2)

## 2021-01-05 LAB — BASIC METABOLIC PANEL
Anion gap: 7 (ref 5–15)
BUN: 10 mg/dL (ref 8–23)
CO2: 28 mmol/L (ref 22–32)
Calcium: 8.6 mg/dL — ABNORMAL LOW (ref 8.9–10.3)
Chloride: 102 mmol/L (ref 98–111)
Creatinine, Ser: 0.93 mg/dL (ref 0.61–1.24)
GFR, Estimated: >60 mL/min (ref >60)
Glucose, Bld: 98 mg/dL (ref 70–99)
Potassium: 4 mmol/L (ref 3.5–5.1)
Sodium: 137 mmol/L (ref 135–145)

## 2021-01-05 LAB — APTT: aPTT: 99 seconds — ABNORMAL HIGH (ref 24–36)

## 2021-01-05 MED ORDER — MIDAZOLAM HCL 2 MG/2ML IJ SOLN
INTRAMUSCULAR | Status: AC | PRN
  Administered 2021-01-05 (×2): 0.5 mg via INTRAVENOUS

## 2021-01-05 MED ORDER — LIDOCAINE HCL 1 % IJ SOLN
10.0000 mL | Freq: Once | INTRAMUSCULAR | Status: DC
  Filled 2021-01-05: qty 10

## 2021-01-05 MED ORDER — FENTANYL CITRATE (PF) 100 MCG/2ML IJ SOLN
INTRAMUSCULAR | Status: AC | PRN
  Administered 2021-01-05 (×2): 25 ug via INTRAVENOUS

## 2021-01-05 MED ORDER — FENTANYL CITRATE (PF) 100 MCG/2ML IJ SOLN
INTRAMUSCULAR | Status: AC
  Filled 2021-01-05: qty 2

## 2021-01-05 MED ORDER — LIDOCAINE HCL 1 % IJ SOLN
INTRAMUSCULAR | Status: AC
  Filled 2021-01-05: qty 10

## 2021-01-05 MED ORDER — MIDAZOLAM HCL 2 MG/2ML IJ SOLN
INTRAMUSCULAR | Status: AC
  Filled 2021-01-05: qty 2

## 2021-01-05 NOTE — Progress Notes (Signed)
PROGRESS NOTE   Marc Lowe  WLN:[id number] DOB: Feb 21, 1951 DOA: 01/04/2021 PCP: Lavada Mesi  Brief Narrative:  70 year old white male home dwelling Recent ospitalized 6/15 through 12/29/2020-LLE DVT + pulmonary embolism placed on Eliquis at that time Nonsymptomatic COVID infection earlier in June BPH followed by Dr. 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 2005 perforated diverticulitis status post ex lap with lysis of adhesion + left colon resection and anastomosis Venous insufficiency  Rx Cipro Flagyl for diverticulitis virtual visit with PCP-completed 7 days-office visit 6/22 revealed further symptoms-found to have small abscess General surgery consulted, IR consulted Zosyn started Eliquis transitioned to heparin  Hospital-Problem based course  Small diverticular abscess Underlying large surgery 2005 for perforated diverticulitis (in [place]) Appreciate IR/general surgery-going for IR placement of drain Continue Zosyn at this time-labs a.m.-defer drain management in O/P to IR for drain study ?  Interval colectomy Recent pulmonary embolus and left lower extremity DVT Heparin at this time-transition to DOAC once all procedures completed Prior tubulovillous adenoma Will need interval 3-year follow-up with [facility] GI Dr. Quentin Cornwall   DVT prophylaxis: Heparin Code Status: Full Family Communication: Discussed with wife Brannan Cassedy at bedside [phone] Disposition:  Status is: Inpatient  Remains inpatient appropriate because:Hemodynamically unstable and Inpatient level of care appropriate due to severity of illness  Dispo: The patient is from: Home              Anticipated d/c is to: Home              Patient currently is not medically stable to d/c.   Difficult to place patient No       Consultants:  Interventional radiology Dr. Vernard Gambles General  surgery  Procedures:   6/24 IR placed 12 French drain  Antimicrobials: Zosyn since 6/23   Subjective: Awake alert coherent pain 4/10 Passing stool-no nausea no vomiting Being wheeled out to IR after I saw him  Objective: Vitals:   01/05/21 1100 01/05/21 1105 01/05/21 1110 01/05/21 1115  BP: 114/70 109/71 111/84 117/73  Pulse: (!) 57 (!) 59 60 61  Resp: 13 16 17 13   Temp:      TempSrc:      SpO2: 99% 99% 99% 95%  Weight:      Height:        Intake/Output Summary (Last 24 hours) at 01/05/2021 1119 Last data filed at 01/05/2021 5631 Gross per 24 hour  Intake 0 ml  Output 500 ml  Net -500 ml   Filed Weights   01/04/21 1903  Weight: 93.4 kg    Examination:  Awake coherent pleasant slightly thick neck no icterus no pallor chest clear no added sound S1-S2 no murmur abdomen soft no rebound no guarding Lower extremities soft nontender neurologically intact no focal deficit ROM intact Psych euthymic  Data Reviewed: personally reviewed   CBC    Component Value Date/Time   WBC 5.6 01/05/2021 0243   RBC 4.39 01/05/2021 0243   HGB 12.9 (L) 01/05/2021 0243   HCT 38.0 (L) 01/05/2021 0243   PLT 296 01/05/2021 0243   MCV 86.6 01/05/2021 0243   MCH 29.4 01/05/2021 0243   MCHC 33.9 01/05/2021 0243   RDW 13.2 01/05/2021 0243   LYMPHSABS 1,030 01/03/2021 0000   MONOABS 1.2 (H) 12/27/2020 1105   EOSABS 39 01/03/2021 0000   BASOSABS 31 01/03/2021 0000   CMP Latest Ref Rng & Units 01/05/2021 01/04/2021 01/03/2021  Glucose 70 - 99 mg/dL 98 98 105(H)  BUN 8 - 23 mg/dL 10 10 15   Creatinine 0.61 - 1.24 mg/dL 0.93 0.89 0.88  Sodium 135 - 145 mmol/L 137 137 137  Potassium 3.5 - 5.1 mmol/L 4.0 4.4 4.5  Chloride 98 - 111 mmol/L 102 102 100  CO2 22 - 32 mmol/L 28 23 28   Calcium 8.9 - 10.3 mg/dL 8.6(L) 9.3 9.1  Total Protein 6.5 - 8.1 g/dL - 7.4 6.8  Total Bilirubin 0.3 - 1.2 mg/dL - 0.9 0.5  Alkaline Phos 38 - 126 U/L - 64 -  AST 15 - 41 U/L - 26 20  ALT 0 - 44 U/L - 38 30      Radiology Studies: CT Abdomen Pelvis W Contrast  Result Date: 01/03/2021 CLINICAL DATA:  History of diverticulitis. Hyperactive bowel sounds. EXAM: CT ABDOMEN AND PELVIS WITH CONTRAST TECHNIQUE: Multidetector CT imaging of the abdomen and pelvis was performed using the standard protocol following bolus administration of intravenous contrast. CONTRAST:  151mL OMNIPAQUE IOHEXOL 300 MG/ML  SOLN COMPARISON:  05/10/2016 FINDINGS: Lower chest: Small right pleural effusion. Right basilar airspace disease. Hepatobiliary: No focal liver abnormality is seen. No gallstones, gallbladder wall thickening, or biliary dilatation. Pancreas: Unremarkable. No pancreatic ductal dilatation or surrounding inflammatory changes. Spleen: Normal in size without focal abnormality. Adrenals/Urinary Tract: Normal adrenal glands. No urolithiasis or obstructive uropathy. Dystrophic left renal cortical calcification. Decompressed bladder. Stomach/Bowel: No bowel dilatation to suggest bowel obstruction. Bowel wall thickening of multiple small bowel loops in the left anterior abdomen. 4.4 x 4.8 x 4 cm complex fluid collection interspersed within the bowel loops in the left mid abdomen with air in the collection most consistent with an abscess. No pneumatosis, pneumoperitoneum or portal venous gas. Vascular/Lymphatic: Normal caliber abdominal aorta with mild atherosclerosis. No lymphadenopathy. Reproductive: Prostate is unremarkable. Other: Anterior abdominal wall hernia repair. Small right spigelian hernia of the right without incarceration. Musculoskeletal: Degenerative disease with disc height loss at L4-5 and L5-S1 with bilateral facet arthropathy. No acute osseous abnormality. No aggressive osseous lesion. IMPRESSION: 1. Bowel wall thickening of multiple small bowel loops in the left anterior abdomen. 4.4 x 4.8 x 4 cm abscess between the small bowel loops in the left mid abdomen. No pneumatosis, pneumoperitoneum or portal venous gas.  No bowel obstruction. 2.  Aortic Atherosclerosis (ICD10-I70.0). Electronically Signed   By: Kathreen Devoid   On: 01/03/2021 18:33     Scheduled Meds:  levothyroxine  25 mcg Oral QAC breakfast   lidocaine       Prednisolone/Moxifloxacin/Bromfenac eye drop  1 drop Left Eye BID   tamsulosin  0.4 mg Oral QHS   Vilazodone HCl  20 mg Oral Daily   Continuous Infusions:  heparin Stopped (01/05/21 0622)   lactated ringers 75 mL/hr at 01/04/21 1329   piperacillin-tazobactam (ZOSYN)  IV 3.375 g (01/05/21 0146)     LOS: 1 day   Time spent: [place], MD [facility] To contact the attending provider between 7A-7P or the covering provider during after hours 7P-7A, please log into the web site [email or web address] and access using universal [place] password for that web site. If you do not have the password, please call the hospital operator.  01/05/2021, 11:19 AM

## 2021-01-05 NOTE — Progress Notes (Addendum)
Progress Note     Subjective: CC: in good spirits awaiting drain placement. Abdominal pain present but well controlled. No nausea, emesis, abdominal pain with clears yesterday. Passing flatus. BM last night. He denies respiratory complaints   Objective: Vital signs in last 24 hours: Temp:  [98 F (36.7 C)-99 F (37.2 C)] 98.2 F (36.8 C) (06/24 0604) Pulse Rate:  [59-69] 59 (06/24 0604) Resp:  [10-22] 17 (06/24 0604) BP: (97-130)/(68-83) 130/75 (06/24 0604) SpO2:  [94 %-98 %] 96 % (06/24 0604) Weight:  [93.4 kg] 93.4 kg (06/23 1903)    Intake/Output from previous day: 06/23 0701 - 06/24 0700 In: -  Out: 500 [Urine:500] Intake/Output this shift: No intake/output data recorded.  PE: General: pleasant, WD, male who is laying in bed in NAD HEENT: head is normocephalic, atraumatic. Mouth is pink and moist Heart: regular rate.  Palpable radial and pedal pulses bilaterally Lungs: Respiratory effort nonlabored Abd: soft, ND, +BS, focal TTP left abdomen MS: all 4 extremities are symmetrical with no cyanosis, clubbing, or edema. Skin: warm and dry with no masses, lesions, or rashes Psych: A&Ox3 with an appropriate affect.    Lab Results:  Recent Labs    01/04/21 0740 01/05/21 0243  WBC 7.6 5.6  HGB 14.5 12.9*  HCT 44.7 38.0*  PLT 378 296   BMET Recent Labs    01/04/21 0740 01/05/21 0243  NA 137 137  K 4.4 4.0  CL 102 102  CO2 23 28  GLUCOSE 98 98  BUN 10 10  CREATININE 0.89 0.93  CALCIUM 9.3 8.6*   PT/INR Recent Labs    01/04/21 1435  LABPROT 16.9*  INR 1.4*   CMP     Component Value Date/Time   NA 137 01/05/2021 0243   K 4.0 01/05/2021 0243   CL 102 01/05/2021 0243   CO2 28 01/05/2021 0243   GLUCOSE 98 01/05/2021 0243   BUN 10 01/05/2021 0243   CREATININE 0.93 01/05/2021 0243   CREATININE 0.88 01/03/2021 0000   CALCIUM 8.6 (L) 01/05/2021 0243   PROT 7.4 01/04/2021 0740   ALBUMIN 3.3 (L) 01/04/2021 0740   AST 26 01/04/2021 0740   ALT 38  01/04/2021 0740   ALKPHOS 64 01/04/2021 0740   BILITOT 0.9 01/04/2021 0740   GFRNONAA >60 01/05/2021 0243   GFRNONAA 88 01/03/2021 0000   GFRAA 102 01/03/2021 0000   Lipase     Component Value Date/Time   LIPASE 35 01/04/2021 0740       Studies/Results: CT Abdomen Pelvis W Contrast  Result Date: 01/03/2021 CLINICAL DATA:  History of diverticulitis. Hyperactive bowel sounds. EXAM: CT ABDOMEN AND PELVIS WITH CONTRAST TECHNIQUE: Multidetector CT imaging of the abdomen and pelvis was performed using the standard protocol following bolus administration of intravenous contrast. CONTRAST:  151mL OMNIPAQUE IOHEXOL 300 MG/ML  SOLN COMPARISON:  05/10/2016 FINDINGS: Lower chest: Small right pleural effusion. Right basilar airspace disease. Hepatobiliary: No focal liver abnormality is seen. No gallstones, gallbladder wall thickening, or biliary dilatation. Pancreas: Unremarkable. No pancreatic ductal dilatation or surrounding inflammatory changes. Spleen: Normal in size without focal abnormality. Adrenals/Urinary Tract: Normal adrenal glands. No urolithiasis or obstructive uropathy. Dystrophic left renal cortical calcification. Decompressed bladder. Stomach/Bowel: No bowel dilatation to suggest bowel obstruction. Bowel wall thickening of multiple small bowel loops in the left anterior abdomen. 4.4 x 4.8 x 4 cm complex fluid collection interspersed within the bowel loops in the left mid abdomen with air in the collection most consistent with an abscess. No pneumatosis,  pneumoperitoneum or portal venous gas. Vascular/Lymphatic: Normal caliber abdominal aorta with mild atherosclerosis. No lymphadenopathy. Reproductive: Prostate is unremarkable. Other: Anterior abdominal wall hernia repair. Small right spigelian hernia of the right without incarceration. Musculoskeletal: Degenerative disease with disc height loss at L4-5 and L5-S1 with bilateral facet arthropathy. No acute osseous abnormality. No aggressive  osseous lesion. IMPRESSION: 1. Bowel wall thickening of multiple small bowel loops in the left anterior abdomen. 4.4 x 4.8 x 4 cm abscess between the small bowel loops in the left mid abdomen. No pneumatosis, pneumoperitoneum or portal venous gas. No bowel obstruction. 2.  Aortic Atherosclerosis (ICD10-I70.0). Electronically Signed   By: Kathreen Devoid   On: 01/03/2021 18:33    Anti-infectives: Anti-infectives (From admission, onward)    Start     Dose/Rate Route Frequency Ordered Stop   01/04/21 1800  piperacillin-tazobactam (ZOSYN) IVPB 3.375 g        3.375 g 12.5 mL/hr over 240 Minutes Intravenous Every 8 hours 01/04/21 1648     01/04/21 1545  piperacillin-tazobactam (ZOSYN) IVPB 3.375 g  Status:  Discontinued        3.375 g 100 mL/hr over 30 Minutes Intravenous Every 8 hours 01/04/21 1540 01/04/21 1648   01/04/21 0930  piperacillin-tazobactam (ZOSYN) IVPB 3.375 g        3.375 g 100 mL/hr over 30 Minutes Intravenous  Once 01/04/21 4503 01/04/21 1207        Assessment/Plan  LLQ intra-abdominal abscess, unclear etiology - history of what sounds like exlap, sigmoidectomy, leak >> colostomy, colostomy take down, hernia repair with mesh  - possible diverticular origin - IR drain placement today - continue abx and supportive care - pain well controlled - we will follow  FEN: NPO, can have diet following drain from our perspective ID: [email or web address] VTE: SCDs, heparin gtt   Recent COVID-19 infection PE - eliquis being held. Last does 6/22 pm     LOS: 1 day    Winferd Humphrey, [facility] 01/05/2021, 8:06 AM Please see Amion for pager number during day hours 7:00am-4:30pm

## 2021-01-05 NOTE — Progress Notes (Signed)
ANTICOAGULATION CONSULT NOTE - Follow-Up Consult  Pharmacy Consult for heparin Indication: pulmonary embolus (on Eliquis PTA)   Patient Measurements: Heparin Dosing Weight: 95.3kg  Vital Signs: Temp: 98.2 F (36.8 C) (06/24 0604) Temp Source: Oral (06/24 0604) BP: 130/75 (06/24 0604) Pulse Rate: 59 (06/24 0604)  Labs: Recent Labs    01/03/21 0000 01/04/21 0740 01/04/21 1435 01/04/21 2055 01/05/21 0243  HGB 14.6 14.5  --   --  12.9*  HCT 44.6 44.7  --   --  38.0*  PLT 450* 378  --   --  296  APTT  --   --  37* 96* 99*  LABPROT  --   --  16.9*  --   --   INR  --   --  1.4*  --   --   HEPARINUNFRC  --   --  >1.10*  --   --   CREATININE 0.88 0.89  --   --  0.93     Estimated Creatinine Clearance: 88.4 mL/min (by C-G formula based on SCr of 0.93 mg/dL).   Assessment: 70 yo M presents with abdominal pain. Recent hospitalized from 6/15-17 for COVID + DVT/PE and discharged on Eliquis, last dose 6/22 evening. Returned with abdominal pain, h/o of diverticulitis with partial bowel resection. CT with abscess with now IR drainage needed.   aPTT level remains therapeutic at 99 seconds on 2000 units/hr, will add anti-Xa level with AM labs, heparin gtt to be held for procedure  Goal of Therapy:  aPTT 66-102 seconds Monitor platelets by anticoagulation protocol: Yes   Plan:  Continue heparin gtt at 2000 units/hr Daily heparin level/aPTT, CBC, s/s bleeding F/u post-IR plan and ability to transition back to [place], PharmD Clinical Pharmacist ED Pharmacist Phone # [phone] 01/05/2021 6:31 AM

## 2021-01-05 NOTE — Progress Notes (Signed)
Received call from IR RN , IV Heparin on hold for procedure.

## 2021-01-05 NOTE — Progress Notes (Signed)
Pt tolerated soft diet well. Felt like he would have a BM soon. Bowel sounds hyperactive and loud.  New PIV dressing at end of shift to re-secure IV in hand.  Heparin running at 20 mL/hr in right hand.  Zosyn running in right AC.  Pt's wife bedside throughout day. She is supportive.  Pt ambulated in room without assistance.  Pain is 1-2/10 at site of drain. Pt refuses any medication. He stated it "really hurts" when he gets of to move, so he opts for the urinal sometimes instead of going to the bathroom to urinate post-drain placement.  Drainage went from sanguinous post-procedure to brown by end of shift.  Pt resting comfortably at end of shift.

## 2021-01-05 NOTE — Hospital Course (Signed)
70 year old white male home dwelling Recent ospitalized 6/15 through 12/29/2020-LLE DVT + pulmonary embolism placed on Eliquis at that time Nonsymptomatic COVID infection earlier in June BPH followed by Dr. 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 2005 perforated diverticulitis status post ex lap with lysis of adhesion + left colon resection and anastomosis Venous insufficiency  Rx Cipro Flagyl for diverticulitis virtual visit with PCP-completed 7 days-office visit 6/22 revealed further symptoms-found to have small abscess General surgery consulted, IR consulted Zosyn started Eliquis transitioned to heparin

## 2021-01-05 NOTE — Procedures (Signed)
  Procedure: CT drain LLQ abscess 31f EBL:   minimal Complications:  none immediate  See full dictation in [facility].  Dillard Cannon MD Main # [phone] Pager  [phone]

## 2021-01-06 LAB — CBC
HCT: 40.3 % (ref 39.0–52.0)
Hemoglobin: 13.1 g/dL (ref 13.0–17.0)
MCH: 28.5 pg (ref 26.0–34.0)
MCHC: 32.5 g/dL (ref 30.0–36.0)
MCV: 87.6 fL (ref 80.0–100.0)
Platelets: 302 10*3/uL (ref 150–400)
RBC: 4.6 MIL/uL (ref 4.22–5.81)
RDW: 13.2 % (ref 11.5–15.5)
WBC: 5.5 10*3/uL (ref 4.0–10.5)
nRBC: 0 % (ref 0.0–0.2)

## 2021-01-06 LAB — HEPARIN LEVEL (UNFRACTIONATED): Heparin Unfractionated: 0.74 IU/mL — ABNORMAL HIGH (ref 0.30–0.70)

## 2021-01-06 LAB — APTT: aPTT: 75 seconds — ABNORMAL HIGH (ref 24–36)

## 2021-01-06 NOTE — Progress Notes (Signed)
PROGRESS NOTE   Marc Lowe  JIR:[id number] DOB: March 08, 1951 DOA: 01/04/2021 PCP: Marc Lowe  Brief Narrative:  70 year old white male home dwelling Recent ospitalized 6/15 through 12/29/2020-LLE DVT + pulmonary embolism placed on Eliquis at that time Nonsymptomatic COVID infection earlier in June BPH followed by Marc Lowe Rectal polyp 12/08/2019 removed by Marc Lowe [facility] tubulovillous right-sided 5 cm 10 cm from anal verge 2005 perforated diverticulitis status post ex lap with lysis of adhesion + left colon resection and anastomosis Venous insufficiency  Rx Cipro Flagyl for diverticulitis virtual visit with PCP-completed 7 days-office visit 6/22 revealed further symptoms-found to have small abscess General surgery consulted, IR consulted Zosyn started Eliquis transitioned to heparin  Hospital-Problem based course  Small diverticular abscess +12 French drain placed 6/24 IR Underlying large surgery 2005 for perforated diverticulitis (in [place]) Appreciate IR/general surgery-Continue Zosyn at this time--defer drain management in O/P to IR for drain study Saline lock IV per patient request-force oral fluids Gram-positive rods abundant WBC gram-negative rods in preliminary cultures 6/24-await sensitivities ?  Interval colectomy Recent pulmonary embolus and left lower extremity DVT Heparin at this time-transition to DOAC once all procedures completed Prior tubulovillous adenoma Will need interval 3-year follow-up with [facility] GI Marc Lowe   DVT prophylaxis: Heparin Code Status: Full Family Communication: Discussed with wife Marc Lowe on 6/24-her phone number [phone] Disposition:  Status is: Inpatient  Remains inpatient appropriate because:Hemodynamically unstable and Inpatient level of care appropriate due to severity of illness  Dispo: The patient is from: Home              Anticipated d/c is to: Home              Patient  currently is not medically stable to d/c.   Difficult to place patient No   Consultants:  Interventional radiology Marc Lowe General surgery  Procedures:   6/24 IR placed 12 French drain  Antimicrobials: Zosyn since 6/23   Subjective:  Coherent pleasant no distress tolerating diet -- significant pain on moving Many questions about possible need for interval surgery which I have deferred to his outpatient surgeons and gastroenterologist-I explained to him he will probably need also a colonoscopy etc. etc.  Does not have any chest pain or fever  Objective: Vitals:   01/05/21 1224 01/05/21 2320 01/06/21 0156 01/06/21 0555  BP: 117/85 112/77 117/79 116/79  Pulse: 65 61 61 65  Resp: 17 18    Temp: 97.9 F (36.6 C) 98.5 F (36.9 C) 98.8 F (37.1 C) 98.3 F (36.8 C)  TempSrc: Oral Oral Oral Oral  SpO2: 98% 96% 98% 96%  Weight:      Height:        Intake/Output Summary (Last 24 hours) at 01/06/2021 0837 Last data filed at 01/06/2021 7824 Gross per 24 hour  Intake 1475 ml  Output 1031 ml  Net 444 ml    Filed Weights   01/04/21 1903  Weight: 93.4 kg    Examination:  Coherent pleasant no distress Chest clear no rales rhonchi S1-S2 no murmur Abdomen slightly distended no rebound bowel sounds heard does have a drain in place with relatively large volumes No lower extremity edema Neurologically intact no focal deficit  Data Reviewed: personally reviewed   CBC    Component Value Date/Time   WBC 5.5 01/06/2021 0057   RBC 4.60 01/06/2021 0057   HGB 13.1 01/06/2021 0057   HCT 40.3 01/06/2021 0057   PLT 302 01/06/2021 0057  MCV 87.6 01/06/2021 0057   MCH 28.5 01/06/2021 0057   MCHC 32.5 01/06/2021 0057   RDW 13.2 01/06/2021 0057   LYMPHSABS 1,030 01/03/2021 0000   MONOABS 1.2 (H) 12/27/2020 1105   EOSABS 39 01/03/2021 0000   BASOSABS 31 01/03/2021 0000   CMP Latest Ref Rng & Units 01/05/2021 01/04/2021 01/03/2021  Glucose 70 - 99 mg/dL 98 98 105(H)  BUN 8  - 23 mg/dL 10 10 15   Creatinine 0.61 - 1.24 mg/dL 0.93 0.89 0.88  Sodium 135 - 145 mmol/L 137 137 137  Potassium 3.5 - 5.1 mmol/L 4.0 4.4 4.5  Chloride 98 - 111 mmol/L 102 102 100  CO2 22 - 32 mmol/L 28 23 28   Calcium 8.9 - 10.3 mg/dL 8.6(L) 9.3 9.1  Total Protein 6.5 - 8.1 g/dL - 7.4 6.8  Total Bilirubin 0.3 - 1.2 mg/dL - 0.9 0.5  Alkaline Phos 38 - 126 U/L - 64 -  AST 15 - 41 U/L - 26 20  ALT 0 - 44 U/L - 38 30     Radiology Studies: CT IMAGE GUIDED DRAINAGE BY PERCUTANEOUS CATHETER  Result Date: 01/05/2021 CLINICAL DATA:  History of diverticulitis. 4.8 cm interloop abscess in the left lower quadrant. Drainage requested. EXAM: CT GUIDED DRAINAGE OF PELVIC ABSCESS ANESTHESIA/SEDATION: Intravenous Fentanyl 78mcg and Versed 1mg  were administered as conscious sedation during continuous monitoring of the patient's level of consciousness and physiological / cardiorespiratory status by the radiology RN, with a total moderate sedation time of 17 minutes. PROCEDURE: The procedure, risks, benefits, and alternatives were explained to the patient. Questions regarding the procedure were encouraged and answered. The patient understands and consents to the procedure. select axial scans through the lower abdomen pelvis obtained. the collection was localized and an appropriate skin entry site was determined and marked. The operative field was prepped with chlorhexidinein a sterile fashion, and a sterile drape was applied covering the operative field. A sterile gown and sterile gloves were used for the procedure. Local anesthesia was provided with 1% Lidocaine. Under CT fluoroscopic guidance, 18 gauge percutaneous entry needle advanced into the collection. Amplatz guidewire advanced easily within the collection, confirmed on CT. Tract dilated to facilitate placement of 12 French pigtail drain catheter, formed centrally. 10 mL of purulent material were aspirated, sent for Gram stain and culture. Catheter was  flushed, secured externally with 0 Prolene suture and StatLock, and placed to gravity drain bag. The patient tolerated the procedure well. COMPLICATIONS: None immediate FINDINGS: Complex left lower quadrant peripheral peritoneal fluid collection was again localized. 12 French pigtail drain catheter placed as above. Sample of the purulent aspirate sent for Gram stain and culture. IMPRESSION: 1. Technically successful CT-guided left lower quadrant abscess drain catheter placement Electronically Signed   By: Lucrezia Europe M.D.   On: 01/05/2021 12:46     Scheduled Meds:  levothyroxine  25 mcg Oral QAC breakfast   lidocaine  10 mL Intradermal Once   Prednisolone/Moxifloxacin/Bromfenac eye drop  1 drop Left Eye BID   tamsulosin  0.4 mg Oral QHS   Vilazodone HCl  20 mg Oral Daily   Continuous Infusions:  heparin Stopped (01/05/21 0622)   lactated ringers 75 mL/hr at 01/04/21 1329   piperacillin-tazobactam (ZOSYN)  IV 3.375 g (01/06/21 0136)     LOS: 2 days   Time spent: 53  Nita Sells, MD [facility] To contact the attending provider between 7A-7P or the covering provider during after hours 7P-7A, please log into the web site [email or web address] and  access using universal [place] password for that web site. If you do not have the password, please call the hospital operator.  01/06/2021, 8:37 AM

## 2021-01-06 NOTE — Progress Notes (Signed)
Progress Note     Subjective: CC: abdominal soreness with movement. None at rest. Tolerating diet without nausea, emesis, abdominal pain. Passing flatus.  Objective: Vital signs in last 24 hours: Temp:  [97.9 F (36.6 C)-98.8 F (37.1 C)] 98.3 F (36.8 C) (06/25 0555) Pulse Rate:  [57-65] 65 (06/25 0555) Resp:  [13-18] 18 (06/24 2320) BP: (109-128)/(69-85) 116/79 (06/25 0555) SpO2:  [95 %-99 %] 96 % (06/25 0555) Last BM Date: 01/05/21  Intake/Output from previous day: 06/24 0701 - 06/25 0700 In: 1475 [P.O.:650; I.V.:825] Out: 1031 [Urine:1031] Intake/Output this shift: No intake/output data recorded.  PE: General: pleasant, WD, male who is laying in bed in NAD HEENT: head is normocephalic, atraumatic. Mouth is pink and moist Heart: regular rate.  Palpable radial and pedal pulses bilaterally Lungs: Respiratory effort nonlabored Abd: soft, ND, +BS, focal TTP left abdomen. Drain with dressing c/d/I and thin brown fluid output MS: all 4 extremities are symmetrical with no cyanosis, clubbing, or edema. Skin: warm and dry with no masses, lesions, or rashes Psych: A&Ox3 with an appropriate affect.    Lab Results:  Recent Labs    01/05/21 0243 01/06/21 0057  WBC 5.6 5.5  HGB 12.9* 13.1  HCT 38.0* 40.3  PLT 296 302    BMET Recent Labs    01/04/21 0740 01/05/21 0243  NA 137 137  K 4.4 4.0  CL 102 102  CO2 23 28  GLUCOSE 98 98  BUN 10 10  CREATININE 0.89 0.93  CALCIUM 9.3 8.6*    PT/INR Recent Labs    01/04/21 1435  LABPROT 16.9*  INR 1.4*    CMP     Component Value Date/Time   NA 137 01/05/2021 0243   K 4.0 01/05/2021 0243   CL 102 01/05/2021 0243   CO2 28 01/05/2021 0243   GLUCOSE 98 01/05/2021 0243   BUN 10 01/05/2021 0243   CREATININE 0.93 01/05/2021 0243   CREATININE 0.88 01/03/2021 0000   CALCIUM 8.6 (L) 01/05/2021 0243   PROT 7.4 01/04/2021 0740   ALBUMIN 3.3 (L) 01/04/2021 0740   AST 26 01/04/2021 0740   ALT 38 01/04/2021 0740    ALKPHOS 64 01/04/2021 0740   BILITOT 0.9 01/04/2021 0740   GFRNONAA >60 01/05/2021 0243   GFRNONAA 88 01/03/2021 0000   GFRAA 102 01/03/2021 0000   Lipase     Component Value Date/Time   LIPASE 35 01/04/2021 0740       Studies/Results: CT IMAGE GUIDED DRAINAGE BY PERCUTANEOUS CATHETER  Result Date: 01/05/2021 CLINICAL DATA:  History of diverticulitis. 4.8 cm interloop abscess in the left lower quadrant. Drainage requested. EXAM: CT GUIDED DRAINAGE OF PELVIC ABSCESS ANESTHESIA/SEDATION: Intravenous Fentanyl 48mcg and Versed 1mg  were administered as conscious sedation during continuous monitoring of the patient's level of consciousness and physiological / cardiorespiratory status by the radiology RN, with a total moderate sedation time of 17 minutes. PROCEDURE: The procedure, risks, benefits, and alternatives were explained to the patient. Questions regarding the procedure were encouraged and answered. The patient understands and consents to the procedure. select axial scans through the lower abdomen pelvis obtained. the collection was localized and an appropriate skin entry site was determined and marked. The operative field was prepped with chlorhexidinein a sterile fashion, and a sterile drape was applied covering the operative field. A sterile gown and sterile gloves were used for the procedure. Local anesthesia was provided with 1% Lidocaine. Under CT fluoroscopic guidance, 18 gauge percutaneous entry needle advanced into the collection. Amplatz  guidewire advanced easily within the collection, confirmed on CT. Tract dilated to facilitate placement of 12 French pigtail drain catheter, formed centrally. 10 mL of purulent material were aspirated, sent for Gram stain and culture. Catheter was flushed, secured externally with 0 Prolene suture and StatLock, and placed to gravity drain bag. The patient tolerated the procedure well. COMPLICATIONS: None immediate FINDINGS: Complex left lower quadrant  peripheral peritoneal fluid collection was again localized. 12 French pigtail drain catheter placed as above. Sample of the purulent aspirate sent for Gram stain and culture. IMPRESSION: 1. Technically successful CT-guided left lower quadrant abscess drain catheter placement Electronically Signed   By: Lucrezia Europe M.D.   On: 01/05/2021 12:46    Anti-infectives: Anti-infectives (From admission, onward)    Start     Dose/Rate Route Frequency Ordered Stop   01/04/21 1800  piperacillin-tazobactam (ZOSYN) IVPB 3.375 g        3.375 g 12.5 mL/hr over 240 Minutes Intravenous Every 8 hours 01/04/21 1648     01/04/21 1545  piperacillin-tazobactam (ZOSYN) IVPB 3.375 g  Status:  Discontinued        3.375 g 100 mL/hr over 30 Minutes Intravenous Every 8 hours 01/04/21 1540 01/04/21 1648   01/04/21 0930  piperacillin-tazobactam (ZOSYN) IVPB 3.375 g        3.375 g 100 mL/hr over 30 Minutes Intravenous  Once 01/04/21 5364 01/04/21 1207        Assessment/Plan  LLQ intra-abdominal abscess, unclear etiology - history of what sounds like exlap, sigmoidectomy, leak >> colostomy, colostomy take down, hernia repair with mesh  - possible diverticular origin - s/p IR drain placement yesterday 6/24 - follow drain culture: gram + cocci, gram - rods - continue abx and supportive care - pain controlled - ambulate today  FEN: soft ID: zosyn VTE: SCDs, heparin gtt   Recent COVID-19 infection PE - eliquis being held. Last does 6/22 pm     LOS: 2 days    Winferd Humphrey, [facility] 01/06/2021, 8:48 AM Please see Amion for pager number during day hours 7:00am-4:30pm

## 2021-01-06 NOTE — Progress Notes (Signed)
ANTICOAGULATION CONSULT NOTE - Follow-Up Consult  Pharmacy Consult for heparin Indication: pulmonary embolus (on Eliquis PTA)   Patient Measurements: Heparin Dosing Weight: 95.3kg  Vital Signs: Temp: 98.3 F (36.8 C) (06/25 0555) Temp Source: Oral (06/25 0555) BP: 116/79 (06/25 0555) Pulse Rate: 65 (06/25 0555)  Labs: Recent Labs    01/04/21 0740 01/04/21 1435 01/04/21 1435 01/04/21 2055 01/05/21 0243 01/06/21 0057  HGB 14.5  --   --   --  12.9* 13.1  HCT 44.7  --   --   --  38.0* 40.3  PLT 378  --   --   --  296 302  APTT  --  37*   < > 96* 99* 75*  LABPROT  --  16.9*  --   --   --   --   INR  --  1.4*  --   --   --   --   HEPARINUNFRC  --  >1.10*  --   --   --  0.74*  CREATININE 0.89  --   --   --  0.93  --    < > = values in this interval not displayed.     Estimated Creatinine Clearance: 88.4 mL/min (by C-G formula based on SCr of 0.93 mg/dL).   Assessment: 71 yo M presents with abdominal pain. Recent hospitalized from 6/15-17 for COVID + DVT/PE and discharged on Eliquis, last dose 6/22 evening. Returned with abdominal pain, h/o of diverticulitis with partial bowel resection. CT with abscess now IR drainage needed.   aPTT level remains therapeutic at 75 seconds on 2000 units/hr, will add anti-Xa level with AM labs, heparin held for procedure. Follow-up plans to resume Eliquis today.   Goal of Therapy:  aPTT 66-102 seconds Monitor platelets by anticoagulation protocol: Yes   Plan:  Continue heparin gtt at 2000 units/hr Daily heparin level/aPTT, CBC, s/s bleeding F/u ability to transition back to Eliquis  Alfonse Spruce, PharmD PGY2 ID Pharmacy Resident Phone between 7 am - 3:30 pm: [phone]  Please check AMION for all [place] phone numbers After 10:00 PM, call [place] [phone]  01/06/2021 7:51 AM

## 2021-01-07 LAB — CBC
HCT: 38.1 % — ABNORMAL LOW (ref 39.0–52.0)
Hemoglobin: 12.4 g/dL — ABNORMAL LOW (ref 13.0–17.0)
MCH: 28.5 pg (ref 26.0–34.0)
MCHC: 32.5 g/dL (ref 30.0–36.0)
MCV: 87.6 fL (ref 80.0–100.0)
Platelets: 271 10*3/uL (ref 150–400)
RBC: 4.35 MIL/uL (ref 4.22–5.81)
RDW: 13.2 % (ref 11.5–15.5)
WBC: 5.3 10*3/uL (ref 4.0–10.5)
nRBC: 0 % (ref 0.0–0.2)

## 2021-01-07 LAB — APTT: aPTT: 94 seconds — ABNORMAL HIGH (ref 24–36)

## 2021-01-07 LAB — HEPARIN LEVEL (UNFRACTIONATED): Heparin Unfractionated: 0.52 IU/mL (ref 0.30–0.70)

## 2021-01-07 MED ORDER — ACETAMINOPHEN 325 MG PO TABS
650.0000 mg | ORAL_TABLET | Freq: Four times a day (QID) | ORAL | Status: DC
  Administered 2021-01-07 – 2021-01-08 (×5): 650 mg via ORAL
  Filled 2021-01-07 (×6): qty 2

## 2021-01-07 MED ORDER — SODIUM CHLORIDE 0.9% FLUSH
5.0000 mL | Freq: Three times a day (TID) | INTRAVENOUS | Status: DC
  Administered 2021-01-07 – 2021-01-08 (×3): 5 mL

## 2021-01-07 MED ORDER — AMOXICILLIN-POT CLAVULANATE 875-125 MG PO TABS
1.0000 | ORAL_TABLET | Freq: Two times a day (BID) | ORAL | Status: DC
  Administered 2021-01-07 – 2021-01-08 (×2): 1 via ORAL
  Filled 2021-01-07 (×3): qty 1

## 2021-01-07 MED ORDER — LIDOCAINE 5 % EX PTCH
1.0000 | MEDICATED_PATCH | CUTANEOUS | Status: DC
  Administered 2021-01-07 – 2021-01-08 (×2): 1 via TRANSDERMAL
  Filled 2021-01-07 (×2): qty 1

## 2021-01-07 MED ORDER — TRAMADOL HCL 50 MG PO TABS
100.0000 mg | ORAL_TABLET | Freq: Four times a day (QID) | ORAL | Status: DC | PRN
Start: 2021-01-07 — End: 2021-01-09
  Administered 2021-01-07 – 2021-01-08 (×2): 100 mg via ORAL
  Filled 2021-01-07 (×2): qty 2

## 2021-01-07 NOTE — Progress Notes (Signed)
ANTICOAGULATION CONSULT NOTE - Follow-Up Consult  Pharmacy Consult for heparin Indication: pulmonary embolus (on Eliquis PTA)   Patient Measurements: Heparin Dosing Weight: 95.3kg  Vital Signs: Temp: 97.8 F (36.6 C) (06/26 0510) Temp Source: Oral (06/26 0510) BP: 124/83 (06/26 0510) Pulse Rate: 55 (06/26 0510)  Labs: Recent Labs    01/04/21 0740 01/04/21 1435 01/04/21 2055 01/05/21 0243 01/06/21 0057 01/07/21 0107  HGB 14.5  --   --  12.9* 13.1 12.4*  HCT 44.7  --   --  38.0* 40.3 38.1*  PLT 378  --   --  296 302 271  APTT  --  37*   < > 99* 75* 94*  LABPROT  --  16.9*  --   --   --   --   INR  --  1.4*  --   --   --   --   HEPARINUNFRC  --  >1.10*  --   --  0.74* 0.52  CREATININE 0.89  --   --  0.93  --   --    < > = values in this interval not displayed.     Estimated Creatinine Clearance: 88.4 mL/min (by C-G formula based on SCr of 0.93 mg/dL).   Assessment: 70 yo M presents with abdominal pain. Recent hospitalized from 6/15-17 for COVID + DVT/PE and discharged on Eliquis, last dose 6/22 evening. Returned with abdominal pain, h/o of diverticulitis with partial bowel resection. CT with abscess now IR drainage needed.   aPTT level remains therapeutic at 94 seconds on 2000 units/hr, will continue heparin today. If remains on heparin in 24-48 hours, would anticipate heparin levels no longer falsely elevated d/t Eliquis. Surgery to continue heparin for now; follow-up ability to transition to Eliquis.  Goal of Therapy:  aPTT 66-102 seconds Monitor platelets by anticoagulation protocol: Yes   Plan:  Continue heparin gtt at 2000 units/hr Daily heparin level/aPTT, CBC, s/s bleeding F/u ability to transition back to Eliquis  Alfonse Spruce, PharmD PGY2 ID Pharmacy Resident Phone between 7 am - 3:30 pm: [phone]  Please check AMION for all [place] phone numbers After 10:00 PM, call [place] [phone]  01/07/2021 7:21 AM

## 2021-01-07 NOTE — Progress Notes (Signed)
PROGRESS NOTE   Marc Lowe  DXI:[id number] DOB: 1951/01/13 DOA: 01/04/2021 PCP: Lavada Mesi  Brief Narrative:  70 year old white male home dwelling Recent ospitalized 6/15 through 12/29/2020-LLE DVT + pulmonary embolism placed on Eliquis at that time Nonsymptomatic COVID infection earlier in June BPH followed by Dr. 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 2005 perforated diverticulitis status post ex lap with lysis of adhesion + left colon resection and anastomosis Venous insufficiency  Rx Cipro Flagyl for diverticulitis virtual visit with PCP-completed 7 days-office visit 6/22 revealed further symptoms-found to have small abscess General surgery consulted, IR consulted Zosyn started Eliquis transitioned to heparin  Hospital-Problem based course  Small diverticular abscess +12 French drain placed 6/24 IR Underlying large surgery 2005 for perforated diverticulitis (in [place]) Appreciate IR/general surgery-Zosyn-->Agmentin to complete 7d defer drain management in O/P to IR for drain study--needs drain flushes as OP bid 10 cc ?  Interval colectomy as OP  Recent pulmonary embolus and left lower extremity DVT Heparin at this time-transition to [place] in am Prior tubulovillous adenoma Will need interval 3-year follow-up with [facility] GI Dr. Quentin Cornwall   DVT prophylaxis: Heparin Code Status: Full Family Communication: Discussed with wife Treyvon Blahut on 6/24-her phone number [phone] Updated patent alone today Disposition:  Status is: Inpatient  Remains inpatient appropriate because:Hemodynamically unstable and Inpatient level of care appropriate due to severity of illness  Dispo: The patient is from: Home              Anticipated d/c is to: Home              Patient currently is not medically stable to d/c.   Difficult to place patient No   Consultants:   Interventional radiology Dr. Vernard Gambles General surgery  Procedures:   6/24 IR placed 12 French drain  Antimicrobials: Zosyn since 6/23   Subjective:  Awake pleasant some pain-likes lidoderm patch No cp Softish stool no fever walking  Does not have any chest pain or fever  Objective: Vitals:   01/06/21 1500 01/06/21 2217 01/07/21 0510 01/07/21 1318  BP: 107/72 104/71 124/83 105/74  Pulse: 70 62 (!) 55 (!) 57  Resp: 18 17 17 18   Temp: 97.8 F (36.6 C) 98.4 F (36.9 C) 97.8 F (36.6 C) 97.9 F (36.6 C)  TempSrc: Oral Oral Oral Oral  SpO2: 96% 95% 96% 97%  Weight:      Height:        Intake/Output Summary (Last 24 hours) at 01/07/2021 1359 Last data filed at 01/07/2021 0900 Gross per 24 hour  Intake 2002.9 ml  Output 1900 ml  Net 102.9 ml    Filed Weights   01/04/21 1903  Weight: 93.4 kg    Examination:  pleasant no distress Chest clear no rales rhonchi S1-S2 no murmur Abdomen ? distended no rebound bowel-increased BS No lower extremity edema Neurologically intact no focal deficit  Data Reviewed: personally reviewed   CBC    Component Value Date/Time   WBC 5.3 01/07/2021 0107   RBC 4.35 01/07/2021 0107   HGB 12.4 (L) 01/07/2021 0107   HCT 38.1 (L) 01/07/2021 0107   PLT 271 01/07/2021 0107   MCV 87.6 01/07/2021 0107   MCH 28.5 01/07/2021 0107   MCHC 32.5 01/07/2021 0107   RDW 13.2 01/07/2021 0107   LYMPHSABS 1,030 01/03/2021 0000   MONOABS 1.2 (H) 12/27/2020 1105   EOSABS 39 01/03/2021 0000  BASOSABS 31 01/03/2021 0000   CMP Latest Ref Rng & Units 01/05/2021 01/04/2021 01/03/2021  Glucose 70 - 99 mg/dL 98 98 105(H)  BUN 8 - 23 mg/dL 10 10 15   Creatinine 0.61 - 1.24 mg/dL 0.93 0.89 0.88  Sodium 135 - 145 mmol/L 137 137 137  Potassium 3.5 - 5.1 mmol/L 4.0 4.4 4.5  Chloride 98 - 111 mmol/L 102 102 100  CO2 22 - 32 mmol/L 28 23 28   Calcium 8.9 - 10.3 mg/dL 8.6(L) 9.3 9.1  Total Protein 6.5 - 8.1 g/dL - 7.4 6.8  Total Bilirubin 0.3 - 1.2 mg/dL  - 0.9 0.5  Alkaline Phos 38 - 126 U/L - 64 -  AST 15 - 41 U/L - 26 20  ALT 0 - 44 U/L - 38 30     Radiology Studies: No results found.   Scheduled Meds:  acetaminophen  650 mg Oral Q6H   amoxicillin-clavulanate  1 tablet Oral Q12H   levothyroxine  25 mcg Oral QAC breakfast   lidocaine  1 patch Transdermal Q24H   lidocaine  10 mL Intradermal Once   Prednisolone/Moxifloxacin/Bromfenac eye drop  1 drop Left Eye BID   sodium chloride flush  5 mL Intracatheter Q8H   tamsulosin  0.4 mg Oral QHS   Vilazodone HCl  20 mg Oral Daily   Continuous Infusions:  heparin 2,000 Units/hr (01/07/21 0517)   lactated ringers 10 mL/hr at 01/06/21 1105     LOS: 3 days   Time spent: [place], MD [facility] To contact the attending provider between 7A-7P or the covering provider during after hours 7P-7A, please log into the web site [email or web address] and access using universal [place] password for that web site. If you do not have the password, please call the hospital operator.  01/07/2021, 1:59 PM

## 2021-01-07 NOTE — Progress Notes (Addendum)
Progress Note     Subjective: CC: did not sleep well last night due to continued abdominal soreness with movement - 3-4/10 with movement. None at rest or when ambulating. Tolerating diet without nausea, emesis, abdominal pain. Passing flatus.  Objective: Vital signs in last 24 hours: Temp:  [97.8 F (36.6 C)-98.4 F (36.9 C)] 97.8 F (36.6 C) (06/26 0510) Pulse Rate:  [55-70] 55 (06/26 0510) Resp:  [17-18] 17 (06/26 0510) BP: (104-124)/(71-83) 124/83 (06/26 0510) SpO2:  [95 %-96 %] 96 % (06/26 0510) Last BM Date: 01/05/21  Intake/Output from previous day: 06/25 0701 - 06/26 0700 In: 2242.9 [P.O.:680; I.V.:1129.3; IV Piggyback:433.6] Out: 2000 [Urine:1950; Drains:50] Intake/Output this shift: No intake/output data recorded.  PE: General: pleasant, WD, male who is laying in bed in NAD HEENT: head is normocephalic, atraumatic. Mouth is pink and moist Heart: regular rate.  Palpable radial and pedal pulses bilaterally Lungs: Respiratory effort nonlabored Abd: soft, ND, +BS, focal TTP left abdomen around drain. Drain with dressing c/d/I and scant thin brown liquid output MS: all 4 extremities are symmetrical with no cyanosis, clubbing, or edema. Skin: warm and dry with no masses, lesions, or rashes Psych: A&Ox3 with an appropriate affect.    Lab Results:  Recent Labs    01/06/21 0057 01/07/21 0107  WBC 5.5 5.3  HGB 13.1 12.4*  HCT 40.3 38.1*  PLT 302 271    BMET Recent Labs    01/05/21 0243  NA 137  K 4.0  CL 102  CO2 28  GLUCOSE 98  BUN 10  CREATININE 0.93  CALCIUM 8.6*    PT/INR Recent Labs    01/04/21 1435  LABPROT 16.9*  INR 1.4*    CMP     Component Value Date/Time   NA 137 01/05/2021 0243   K 4.0 01/05/2021 0243   CL 102 01/05/2021 0243   CO2 28 01/05/2021 0243   GLUCOSE 98 01/05/2021 0243   BUN 10 01/05/2021 0243   CREATININE 0.93 01/05/2021 0243   CREATININE 0.88 01/03/2021 0000   CALCIUM 8.6 (L) 01/05/2021 0243   PROT 7.4  01/04/2021 0740   ALBUMIN 3.3 (L) 01/04/2021 0740   AST 26 01/04/2021 0740   ALT 38 01/04/2021 0740   ALKPHOS 64 01/04/2021 0740   BILITOT 0.9 01/04/2021 0740   GFRNONAA >60 01/05/2021 0243   GFRNONAA 88 01/03/2021 0000   GFRAA 102 01/03/2021 0000   Lipase     Component Value Date/Time   LIPASE 35 01/04/2021 0740       Studies/Results: CT IMAGE GUIDED DRAINAGE BY PERCUTANEOUS CATHETER  Result Date: 01/05/2021 CLINICAL DATA:  History of diverticulitis. 4.8 cm interloop abscess in the left lower quadrant. Drainage requested. EXAM: CT GUIDED DRAINAGE OF PELVIC ABSCESS ANESTHESIA/SEDATION: Intravenous Fentanyl 38mcg and Versed 1mg  were administered as conscious sedation during continuous monitoring of the patient's level of consciousness and physiological / cardiorespiratory status by the radiology RN, with a total moderate sedation time of 17 minutes. PROCEDURE: The procedure, risks, benefits, and alternatives were explained to the patient. Questions regarding the procedure were encouraged and answered. The patient understands and consents to the procedure. select axial scans through the lower abdomen pelvis obtained. the collection was localized and an appropriate skin entry site was determined and marked. The operative field was prepped with chlorhexidinein a sterile fashion, and a sterile drape was applied covering the operative field. A sterile gown and sterile gloves were used for the procedure. Local anesthesia was provided with 1% Lidocaine. Under CT  fluoroscopic guidance, 18 gauge percutaneous entry needle advanced into the collection. Amplatz guidewire advanced easily within the collection, confirmed on CT. Tract dilated to facilitate placement of 12 French pigtail drain catheter, formed centrally. 10 mL of purulent material were aspirated, sent for Gram stain and culture. Catheter was flushed, secured externally with 0 Prolene suture and StatLock, and placed to gravity drain bag. The  patient tolerated the procedure well. COMPLICATIONS: None immediate FINDINGS: Complex left lower quadrant peripheral peritoneal fluid collection was again localized. 12 French pigtail drain catheter placed as above. Sample of the purulent aspirate sent for Gram stain and culture. IMPRESSION: 1. Technically successful CT-guided left lower quadrant abscess drain catheter placement Electronically Signed   By: Lucrezia Europe M.D.   On: 01/05/2021 12:46    Anti-infectives: Anti-infectives (From admission, onward)    Start     Dose/Rate Route Frequency Ordered Stop   01/04/21 1800  piperacillin-tazobactam (ZOSYN) IVPB 3.375 g        3.375 g 12.5 mL/hr over 240 Minutes Intravenous Every 8 hours 01/04/21 1648     01/04/21 1545  piperacillin-tazobactam (ZOSYN) IVPB 3.375 g  Status:  Discontinued        3.375 g 100 mL/hr over 30 Minutes Intravenous Every 8 hours 01/04/21 1540 01/04/21 1648   01/04/21 0930  piperacillin-tazobactam (ZOSYN) IVPB 3.375 g        3.375 g 100 mL/hr over 30 Minutes Intravenous  Once 01/04/21 6440 01/04/21 1207        Assessment/Plan  LLQ intra-abdominal abscess, unclear etiology - history of what sounds like exlap, sigmoidectomy, leak >> colostomy, colostomy take down, hernia repair with mesh  - possible diverticular origin vs mesh complication from prior IPOM VHR - s/p IR drain placement yesterday 6/24. Will discharge with drain and will need follow up with IR and [place] - afebrile, WBC WNL - follow drain culture: gram + cocci, gram - rods - continue abx and supportive care - multimodal pain control - scheduled tylenol, Lidoderm patch, tramadol, morphine IV for severe - ambulate and use IS  FEN: soft ID: zosyn VTE: SCDs, heparin gtt   Recent COVID-19 infection PE - eliquis being held. Last does 6/22 pm   Dispo: once medically ready discharge on PO abx (Augmentin) and close follow up with IR and [place]     LOS: 3 days    Winferd Humphrey, [facility]  Surgery 01/07/2021, 7:45 AM Please see Amion for pager number during day hours 7:00am-4:30pm

## 2021-01-08 LAB — CBC
HCT: 39.3 % (ref 39.0–52.0)
Hemoglobin: 12.9 g/dL — ABNORMAL LOW (ref 13.0–17.0)
MCH: 29 pg (ref 26.0–34.0)
MCHC: 32.8 g/dL (ref 30.0–36.0)
MCV: 88.3 fL (ref 80.0–100.0)
Platelets: 286 10*3/uL (ref 150–400)
RBC: 4.45 MIL/uL (ref 4.22–5.81)
RDW: 13.1 % (ref 11.5–15.5)
WBC: 5.3 10*3/uL (ref 4.0–10.5)
nRBC: 0 % (ref 0.0–0.2)

## 2021-01-08 LAB — APTT: aPTT: 153 seconds — ABNORMAL HIGH (ref 24–36)

## 2021-01-08 LAB — HEPARIN LEVEL (UNFRACTIONATED): Heparin Unfractionated: 0.65 IU/mL (ref 0.30–0.70)

## 2021-01-08 MED ORDER — METRONIDAZOLE 500 MG PO TABS: 500.0000 mg | ORAL_TABLET | Freq: Two times a day (BID) | ORAL | 0 refills | Status: AC

## 2021-01-08 MED ORDER — APIXABAN 5 MG PO TABS
5.0000 mg | ORAL_TABLET | Freq: Two times a day (BID) | ORAL | Status: DC
Start: 2021-01-08 — End: 2021-01-09
  Administered 2021-01-08: 5 mg via ORAL
  Filled 2021-01-08: qty 1

## 2021-01-08 MED ORDER — AMOXICILLIN-POT CLAVULANATE 875-125 MG PO TABS: 1.0000 | ORAL_TABLET | Freq: Two times a day (BID) | ORAL | 0 refills | Status: DC

## 2021-01-08 MED ORDER — OXYCODONE-ACETAMINOPHEN 5-325 MG PO TABS: 1.0000 | ORAL_TABLET | ORAL | 0 refills | Status: DC | PRN

## 2021-01-08 MED ORDER — CEFDINIR 300 MG PO CAPS: 300.0000 mg | ORAL_CAPSULE | Freq: Two times a day (BID) | ORAL | 0 refills | Status: DC

## 2021-01-08 MED ORDER — LIDOCAINE 5 % EX OINT: 1.0000 "application " | TOPICAL_OINTMENT | CUTANEOUS | 0 refills | Status: DC | PRN

## 2021-01-08 MED ORDER — TRAMADOL HCL 50 MG PO TABS: 100.0000 mg | ORAL_TABLET | Freq: Four times a day (QID) | ORAL | 0 refills | Status: DC | PRN

## 2021-01-08 NOTE — Discharge Summary (Addendum)
Physician Discharge Summary  Marc Lowe XTG:[id number] DOB: 28-Nov-1950 DOA: 01/04/2021  PCP: Donella Stade, PA-C  Admit date: 01/04/2021 Discharge date: 01/08/2021  Time spent: 26 minutes  Recommendations for Outpatient Follow-up:  Outpatient close follow-up with general surgery for interval follow-up in addition to IR follow-up Complete antibiotics as dictated Needs outpatient CBC Chem-7 Consider TSH in about 3 to 4 weeks   Discharge Diagnoses:  MAIN problem for hospitalization   Left lower quadrant intra-abdominal abscess?  Diverticular  Please see below for itemized issues addressed in [place]- refer to other progress notes for clarity if needed  Discharge Condition: Improved  Diet recommendation: Heart healthy soft diet  Filed Weights   01/04/21 1903  Weight: 93.4 kg    History of present illness:    Hospital Course:  70 year old white male home dwelling Recent hospitalized 6/15 through 12/29/2020-LLE DVT + pulmonary embolism placed on Eliquis at that time Nonsymptomatic COVID infection earlier in June BPH followed by Dr. 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 2005 perforated diverticulitis status post ex lap with lysis of adhesion + left colon resection and anastomosis Venous insufficiency   Rx Cipro Flagyl for diverticulitis virtual visit with PCP-completed 7 days-office visit 6/22 revealed further symptoms-found to have small abscess General surgery consulted, IR consulted Zosyn started Eliquis transitioned to heparin   Small diverticular abscess +12 French drain placed 6/24 IR Underlying large surgery 2005 for perforated diverticulitis (in [place]) Appreciate IR/general surgery-Zosyn-->cefdinir/flagyl to complete 10 days [resistant e.coli to quinolones-augmentin] defer drain management in O/P to IR for drain study--needs drain flushes as OP bid 10 cc ?   Interval colectomy as OP versus other planning deferred to the outpatient with general surgery and IR following Recent pulmonary embolus and left lower extremity DVT Heparin is used as there was a concern for need for inpatient surgery however as no surgery is planned patient was transitioned back to prior dose of DOAC Will need at least 6 months of this Prior tubulovillous adenoma Will need interval 3-year follow-up with [facility] GI Dr. Quentin Cornwall  Rest of problems during hospital stay were relatively stable he was resumed on the majority of his meds on discharge  Procedures: 12 French placed drain 6/24 by IR  Consultations: General surgery IR  Discharge Exam: Vitals:   01/07/21 2015 01/08/21 0432  BP: 103/72 111/71  Pulse: 65 (!) 58  Resp: 20 18  Temp: 97.7 F (36.5 C) 97.8 F (36.6 C)  SpO2: 96% 97%    Subj on day of d/c   Awake alert coherent still having some abdominal pain We discussed lidocaine ointment as this may be cheaper than lidocaine He understands to take pain meds and graded fashion He is walked around several times in the room is not distressed He is passing bowel movements  General Exam on discharge  Awake coherent no distress EOMI NCAT no focal deficit Abdomen soft no rebound bowel sounds decreased slightly no organomegaly S1-S2 no murmur no rub no gallop No lower extremity edema Neurologically intact   Discharge Instructions   Discharge Instructions     Change dressing (specify)   Complete by: As directed    Dressing change: as needed to keep drain site clean and dry.   Diet - low sodium heart healthy   Complete by: As directed    Discharge instructions   Complete by: As directed    Flush drain 1-2 times daily with  5-10 mL sterile saline.  Dressing changes Q3days or PRN if soiled/wet and record output once daily. Bring log of output trends with you to appointments.  Expect to see Interventional Radiology in 7-10 days for follow up  CT/possible drain injection. I   Discharge instructions   Complete by: As directed    Complete ABx and follow instructions from surgery   Increase activity slowly   Complete by: As directed    No wound care   Complete by: As directed       Allergies as of 01/08/2021       Reactions   Nsaids Other (See Comments)   SHOULD NOT HAVE THIS CLASS OF MEDICATION- HISTORY OF DIVERTICULITIS   Hydromorphone Nausea And Vomiting   Atorvastatin Nausea Only, Other (See Comments)   Abdominal pain and reflux, also   Hydromorphone Hcl Nausea And Vomiting   Rosuvastatin Nausea And Vomiting   Zetia [ezetimibe] Nausea Only, Other (See Comments)   Caused an upset stomach and made patient's body hurt        Medication List     TAKE these medications    B COMPLEX-C ER PO Take 1 tablet by mouth daily.   cefdinir 300 MG capsule Commonly known as: OMNICEF Take 1 capsule (300 mg total) by mouth 2 (two) times daily.   Eliquis DVT/PE Starter Pack Generic drug: Apixaban Starter Pack (10mg  and 5mg ) Take as directed on package: start with two-5mg  tablets twice daily for 7 days. On day 8, switch to one-5mg  tablet twice daily.   Eliquis 5 MG Tabs tablet Generic drug: apixaban Take 1 tablet (5 mg total) by mouth 2 (two) times daily.   levothyroxine 25 MCG tablet Commonly known as: Euthyrox Take 1 tablet (25 mcg total) by mouth daily before breakfast.   lidocaine 5 % ointment Commonly known as: XYLOCAINE Apply 1 application topically as needed.   magnesium gluconate 500 MG tablet Commonly known as: MAGONATE Take 500 mg by mouth daily.   metroNIDAZOLE 500 MG tablet Commonly known as: Flagyl Take 1 tablet (500 mg total) by mouth 2 (two) times daily for 10 days.   NONFORMULARY OR COMPOUNDED ITEM Place 1 drop into the left eye See admin instructions. Sterile/Compounded eyedrops (Prednisolone/Moxifloxacin/Bromfenac) Instill 1 drop into the left eye in the morning and at bedtime for 5 weeks    oxyCODONE-acetaminophen 5-325 MG tablet Commonly known as: Percocet Take 1 tablet by mouth every 4 (four) hours as needed for severe pain.   tamsulosin 0.4 MG Caps capsule Commonly known as: FLOMAX Take 0.4 mg by mouth at bedtime.   traMADol 50 MG tablet Commonly known as: ULTRAM Take 2 tablets (100 mg total) by mouth every 6 (six) hours as needed for moderate pain.   Turmeric 500 MG Caps Take 500 mg by mouth daily.   VIAGRA PO Take by mouth.   Viibryd 20 MG Tabs Generic drug: Vilazodone HCl Take 1 tablet (20 mg total) by mouth daily.   VITAMIN A PO Take 375 mg by mouth daily.   vitamin C 250 MG tablet Commonly known as: ASCORBIC ACID Take 250 mg by mouth daily.   Vitamin D3 25 MCG (1000 UT) Caps Take 500 Units by mouth daily.   ZINC PO Take 7.5 mg by mouth daily.               Discharge Care Instructions  (From admission, onward)           Start     Ordered  01/07/21 0000  Change dressing (specify)       Comments: Dressing change: as needed to keep drain site clean and dry.   01/07/21 1333           Allergies  Allergen Reactions   Nsaids Other (See Comments)    SHOULD NOT HAVE THIS CLASS OF MEDICATION- HISTORY OF DIVERTICULITIS   Hydromorphone Nausea And Vomiting   Atorvastatin Nausea Only and Other (See Comments)    Abdominal pain and reflux, also   Hydromorphone Hcl Nausea And Vomiting   Rosuvastatin Nausea And Vomiting   Zetia [Ezetimibe] Nausea Only and Other (See Comments)    Caused an upset stomach and made patient's body hurt    Follow-up Information     Stechschulte, Nickola Major, MD Follow up in 2 week(s).   Specialty: Surgery Why: our office is working to schedule your appointment. Please call to confirm date and time Contact information: [place]. [address] [place] [address] [phone]         Arne Cleveland, MD Follow up.   Specialties: Interventional Radiology, Radiology Why: Schedulers will contact you  with date and time of follow-up appointment. Contact information: [place] [address] [phone]                  The results of significant diagnostics from this hospitalization (including imaging, microbiology, ancillary and laboratory) are listed below for reference.    Significant Diagnostic Studies: CT Angio Chest W/Cm &/Or Wo Cm  Result Date: 12/27/2020 CLINICAL DATA:  Increasing shortness of breath over the past few weeks, initial encounter EXAM: CT ANGIOGRAPHY CHEST WITH CONTRAST TECHNIQUE: Multidetector CT imaging of the chest was performed using the standard protocol during bolus administration of intravenous contrast. Multiplanar CT image reconstructions and MIPs were obtained to evaluate the vascular anatomy. CONTRAST:  131mL OMNIPAQUE IOHEXOL 350 MG/ML SOLN COMPARISON:  None. FINDINGS: Cardiovascular: Thoracic aorta and its branches demonstrate mild atherosclerotic calcification. No cardiac enlargement is seen. Pulmonary artery demonstrates multiple filling defects primarily on the right consistent with pulmonary emboli. No significant right heart strain is identified. Mild coronary calcifications are seen. Mediastinum/Nodes: Thoracic inlet is within normal limits. No sizable hilar or mediastinal adenopathy is noted. The esophagus as visualized is within normal limits. Lungs/Pleura: Small right pleural effusion is noted. Mild bibasilar atelectatic changes are seen slightly worse on the right than the left likely related to the underlying pulmonary emboli. No sizable parenchymal nodule is seen. Upper Abdomen: Visualized upper abdomen is within normal limits. Musculoskeletal: Degenerative changes of the thoracic spine are noted. Review of the MIP images confirms the above findings. IMPRESSION: Bilateral pulmonary emboli right greater than left with associated atelectatic changes in the bases as well as a small right pleural effusion. No right heart  strain is seen. Aortic Atherosclerosis (ICD10-I70.0). These results will be called to the ordering clinician or representative by the Radiologist Assistant, and communication documented in the PACS or [employer]. Electronically Signed   By: Inez Catalina M.D.   On: 12/27/2020 11:00   CT Abdomen Pelvis W Contrast  Result Date: 01/03/2021 CLINICAL DATA:  History of diverticulitis. Hyperactive bowel sounds. EXAM: CT ABDOMEN AND PELVIS WITH CONTRAST TECHNIQUE: Multidetector CT imaging of the abdomen and pelvis was performed using the standard protocol following bolus administration of intravenous contrast. CONTRAST:  131mL OMNIPAQUE IOHEXOL 300 MG/ML  SOLN COMPARISON:  05/10/2016 FINDINGS: Lower chest: Small right pleural effusion. Right basilar airspace disease. Hepatobiliary: No focal  liver abnormality is seen. No gallstones, gallbladder wall thickening, or biliary dilatation. Pancreas: Unremarkable. No pancreatic ductal dilatation or surrounding inflammatory changes. Spleen: Normal in size without focal abnormality. Adrenals/Urinary Tract: Normal adrenal glands. No urolithiasis or obstructive uropathy. Dystrophic left renal cortical calcification. Decompressed bladder. Stomach/Bowel: No bowel dilatation to suggest bowel obstruction. Bowel wall thickening of multiple small bowel loops in the left anterior abdomen. 4.4 x 4.8 x 4 cm complex fluid collection interspersed within the bowel loops in the left mid abdomen with air in the collection most consistent with an abscess. No pneumatosis, pneumoperitoneum or portal venous gas. Vascular/Lymphatic: Normal caliber abdominal aorta with mild atherosclerosis. No lymphadenopathy. Reproductive: Prostate is unremarkable. Other: Anterior abdominal wall hernia repair. Small right spigelian hernia of the right without incarceration. Musculoskeletal: Degenerative disease with disc height loss at L4-5 and L5-S1 with bilateral facet arthropathy. No acute osseous  abnormality. No aggressive osseous lesion. IMPRESSION: 1. Bowel wall thickening of multiple small bowel loops in the left anterior abdomen. 4.4 x 4.8 x 4 cm abscess between the small bowel loops in the left mid abdomen. No pneumatosis, pneumoperitoneum or portal venous gas. No bowel obstruction. 2.  Aortic Atherosclerosis (ICD10-I70.0). Electronically Signed   By: Kathreen Devoid   On: 01/03/2021 18:33   US Venous Img Lower Bilateral  Result Date: 12/27/2020 CLINICAL DATA:  Recent diagnosis of bilateral pulmonary emboli. EXAM: BILATERAL LOWER EXTREMITY VENOUS DOPPLER ULTRASOUND TECHNIQUE: Gray-scale sonography with compression, as well as color and duplex ultrasound, were performed to evaluate the deep venous system(s) from the level of the common femoral vein through the popliteal and proximal calf veins. COMPARISON:  07/02/2019 FINDINGS: VENOUS On the right, normal compressibility of the common femoral, superficial femoral, and popliteal veins, as well as the visualized calf veins. Visualized portions of profunda femoral vein and great saphenous vein unremarkable. No filling defects to suggest DVT on grayscale or color Doppler imaging. Doppler waveforms show normal direction of venous flow, normal respiratory phasicity and response to augmentation. On the left, normal compressibility of the common femoral, and femoral vein. Saphenofemoral junction patent. There is hypoechoic noncompressible thrombus in the popliteal vein with a small amount of persistent antegrade signal on Doppler and color Doppler. There is partially occlusive thrombus in the gastric vein, and occlusive thrombus in the posterior tibial vein. OTHER None. Limitations: none IMPRESSION: 1. POSITIVE for left posterior tibial, gastric, and popliteal DVT. 2. Negative for right lower extremity DVT. Electronically Signed   By: Lucrezia Europe M.D.   On: 12/27/2020 13:41   ECHOCARDIOGRAM COMPLETE  Result Date: 12/28/2020    ECHOCARDIOGRAM REPORT   Patient  Name:   Marc Lowe Date of Exam: 12/28/2020 Medical Rec #:  [id number]        Height:       74.0 in Accession #:    [id number]       Weight:       228.0 lb Date of Birth:  [birth year]       BSA:          2.298 m Patient Age:    53 years         BP:           114/72 mmHg Patient Gender: M                HR:           59 bpm. Exam Location:  Inpatient Procedure: 2D Echo, Cardiac Doppler and Color Doppler Indications:    I26.02  Pulmonary embolus  History:        Patient has no prior history of Echocardiogram examinations.                 COVID-19 Positive.  Sonographer:    Jonelle Sidle Dance Referring Phys: [id number] Lequita Halt  Sonographer Comments: No subcostal window. IMPRESSIONS  1. Left ventricular ejection fraction, by estimation, is 55 to 60%. The left ventricle has normal function. The left ventricle has no regional wall motion abnormalities. There is mild concentric left ventricular hypertrophy. Left ventricular diastolic parameters are indeterminate.  2. Right ventricular systolic function is normal. The right ventricular size is normal.  3. The mitral valve is normal in structure. Trivial mitral valve regurgitation. No evidence of mitral stenosis.  4. The aortic valve is normal in structure. Aortic valve regurgitation is trivial. No aortic stenosis is present.  5. Aortic dilatation noted. There is mild dilatation of the aortic root, measuring 43 mm. There is borderline dilatation of the ascending aorta, measuring 37 mm. FINDINGS  Left Ventricle: Left ventricular ejection fraction, by estimation, is 55 to 60%. The left ventricle has normal function. The left ventricle has no regional wall motion abnormalities. The left ventricular internal cavity size was normal in size. There is  mild concentric left ventricular hypertrophy. Left ventricular diastolic parameters are indeterminate. Normal left ventricular filling pressure. Right Ventricle: The right ventricular size is normal. No increase in right ventricular  wall thickness. Right ventricular systolic function is normal. Left Atrium: Left atrial size was normal in size. Right Atrium: Right atrial size was normal in size. Pericardium: There is no evidence of pericardial effusion. Mitral Valve: The mitral valve is normal in structure. Trivial mitral valve regurgitation. No evidence of mitral valve stenosis. Tricuspid Valve: The tricuspid valve is normal in structure. Tricuspid valve regurgitation is trivial. No evidence of tricuspid stenosis. Aortic Valve: The aortic valve is normal in structure. Aortic valve regurgitation is trivial. No aortic stenosis is present. Pulmonic Valve: The pulmonic valve was normal in structure. Pulmonic valve regurgitation is trivial. No evidence of pulmonic stenosis. Aorta: Aortic dilatation noted. There is mild dilatation of the aortic root, measuring 43 mm. There is borderline dilatation of the ascending aorta, measuring 37 mm. Venous: The inferior vena cava was not well visualized. IAS/Shunts: No atrial level shunt detected by color flow Doppler.  LEFT VENTRICLE PLAX 2D LVIDd:         4.40 cm  Diastology LVIDs:         2.70 cm  LV e' medial:    5.98 cm/s LV PW:         1.40 cm  LV E/e' medial:  11.9 LV IVS:        1.30 cm  LV e' lateral:   7.07 cm/s LVOT diam:     2.00 cm  LV E/e' lateral: 10.1 LV SV:         77 LV SV Index:   33 LVOT Area:     3.14 cm  RIGHT VENTRICLE RV Basal diam:  3.20 cm RV Mid diam:    2.10 cm RV S prime:     12.80 cm/s TAPSE (M-mode): 2.0 cm LEFT ATRIUM           Index       RIGHT ATRIUM           Index LA diam:      3.20 cm 1.39 cm/m  RA Area:     21.10 cm LA Vol (A2C):  77.3 ml 33.64 ml/m RA Volume:   63.50 ml  27.63 ml/m LA Vol (A4C): 77.3 ml 33.64 ml/m  AORTIC VALVE LVOT Vmax:   107.00 cm/s LVOT Vmean:  74.600 cm/s LVOT VTI:    0.245 m  AORTA Ao Root diam: 4.30 cm Ao Asc diam:  3.70 cm MITRAL VALVE               TRICUSPID VALVE MV Area (PHT): 4.46 cm    TR Peak grad:   17.5 mmHg MV Decel Time: 170 msec     TR Vmax:        209.00 cm/s MV E velocity: 71.10 cm/s MV A velocity: 52.30 cm/s  SHUNTS MV E/A ratio:  1.36        Systemic VTI:  0.24 m                            Systemic Diam: 2.00 cm Fransico Him MD Electronically signed by Fransico Him MD Signature Date/Time: 12/28/2020/2:59:59 PM    Final    CT IMAGE GUIDED DRAINAGE BY PERCUTANEOUS CATHETER  Result Date: 01/05/2021 CLINICAL DATA:  History of diverticulitis. 4.8 cm interloop abscess in the left lower quadrant. Drainage requested. EXAM: CT GUIDED DRAINAGE OF PELVIC ABSCESS ANESTHESIA/SEDATION: Intravenous Fentanyl 77mcg and Versed 1mg  were administered as conscious sedation during continuous monitoring of the patient's level of consciousness and physiological / cardiorespiratory status by the radiology RN, with a total moderate sedation time of 17 minutes. PROCEDURE: The procedure, risks, benefits, and alternatives were explained to the patient. Questions regarding the procedure were encouraged and answered. The patient understands and consents to the procedure. select axial scans through the lower abdomen pelvis obtained. the collection was localized and an appropriate skin entry site was determined and marked. The operative field was prepped with chlorhexidinein a sterile fashion, and a sterile drape was applied covering the operative field. A sterile gown and sterile gloves were used for the procedure. Local anesthesia was provided with 1% Lidocaine. Under CT fluoroscopic guidance, 18 gauge percutaneous entry needle advanced into the collection. Amplatz guidewire advanced easily within the collection, confirmed on CT. Tract dilated to facilitate placement of 12 French pigtail drain catheter, formed centrally. 10 mL of purulent material were aspirated, sent for Gram stain and culture. Catheter was flushed, secured externally with 0 Prolene suture and StatLock, and placed to gravity drain bag. The patient tolerated the procedure well. COMPLICATIONS: None  immediate FINDINGS: Complex left lower quadrant peripheral peritoneal fluid collection was again localized. 12 French pigtail drain catheter placed as above. Sample of the purulent aspirate sent for Gram stain and culture. IMPRESSION: 1. Technically successful CT-guided left lower quadrant abscess drain catheter placement Electronically Signed   By: Lucrezia Europe M.D.   On: 01/05/2021 12:46    Microbiology: Recent Results (from the past 240 hour(s))  Aerobic/Anaerobic Culture w Gram Stain (surgical/deep wound)     Status: None (Preliminary result)   Collection Time: 01/05/21 11:21 AM   Specimen: Abscess  Result Value Ref Range Status   Specimen Description ABSCESS  Final   Special Requests LLQ DRAINAGE  Final   Gram Stain   Final    ABUNDANT WBC PRESENT, PREDOMINANTLY PMN FEW GRAM POSITIVE COCCI IN PAIRS IN CHAINS RARE GRAM NEGATIVE RODS Performed at [facility], [address] [place], [place] [address]    Culture   Final    Emmit [place] NEGATIVE RODS IDENTIFICATION AND SUSCEPTIBILITIES TO  FOLLOW CULTURE REINCUBATED FOR BETTER GROWTH NO ANAEROBES ISOLATED; CULTURE IN PROGRESS FOR 5 DAYS    Report Status PENDING  Incomplete     Labs: Basic Metabolic Panel: Recent Labs  Lab 01/03/21 0000 01/04/21 0740 01/05/21 0243  NA 137 137 137  K 4.5 4.4 4.0  CL 100 102 102  CO2 28 23 28   GLUCOSE 105* 98 98  BUN 15 10 10   CREATININE 0.88 0.89 0.93  CALCIUM 9.1 9.3 8.6*   Liver Function Tests: Recent Labs  Lab 01/03/21 0000 01/04/21 0740  AST 20 26  ALT 30 38  ALKPHOS  --  64  BILITOT 0.5 0.9  PROT 6.8 7.4  ALBUMIN  --  3.3*   Recent Labs  Lab 01/04/21 0740  LIPASE 35   No results for input(s): AMMONIA in the last 168 hours. CBC: Recent Labs  Lab 01/03/21 0000 01/04/21 0740 01/05/21 0243 01/06/21 0057 01/07/21 0107 01/08/21 0043  WBC 7.8 7.6 5.6 5.5 5.3 5.3  NEUTROABS 5,858  --   --   --   --   --   HGB 14.6 14.5 12.9* 13.1 12.4* 12.9*  HCT 44.6 44.7  38.0* 40.3 38.1* 39.3  MCV 88.5 89.2 86.6 87.6 87.6 88.3  PLT 450* 378 296 302 271 286   Cardiac Enzymes: No results for input(s): CKTOTAL, CKMB, CKMBINDEX, TROPONINI in the last 168 hours. BNP: BNP (last 3 results) No results for input(s): BNP in the last 8760 hours.  ProBNP (last 3 results) No results for input(s): PROBNP in the last 8760 hours.  CBG: No results for input(s): GLUCAP in the last 168 hours.     Signed:  Nita Sells MD   [facility] 01/08/2021, 9:32 AM

## 2021-01-08 NOTE — Progress Notes (Signed)
[name] to be D/C'd  per MD order.  Discussed with the patient and all questions fully answered.  VSS, Skin clean, dry and intact without evidence of skin break down, no evidence of skin tears noted.   IV catheter discontinued intact. Site without signs and symptoms of complications. Dressing and pressure applied.  An After Visit Summary was printed and given to the patient.  D/c education completed with patient/family including follow up instructions, medication list, d/c activities limitations if indicated, with other d/c instructions as indicated by MD - patient able to verbalize understanding, all questions fully answered. Patient able to flush surgical drain and verbalizes education on changing dressings.   Patient instructed to return to ED, call 911, or call MD for any changes in condition.   Patient to be escorted via [place], and D/C home via private auto.

## 2021-01-08 NOTE — Discharge Instructions (Addendum)
Follow up with Interventional radiology to check your drain then with Dr. Thermon Leyland as scheduled for further treatment planning. Complete antibiotics as prescribed.

## 2021-01-08 NOTE — Progress Notes (Signed)
Progress Note     Subjective: CC: still with pain around drain site but lidocaine patch helped tremendously. Ambulating and tolerating diet. Having Bms  Objective: Vital signs in last 24 hours: Temp:  [97.7 F (36.5 C)-97.9 F (36.6 C)] 97.8 F (36.6 C) (06/27 0432) Pulse Rate:  [57-65] 58 (06/27 0432) Resp:  [18-20] 18 (06/27 0432) BP: (103-111)/(71-74) 111/71 (06/27 0432) SpO2:  [96 %-97 %] 97 % (06/27 0432) Last BM Date: 01/07/21  Intake/Output from previous day: 06/26 0701 - 06/27 0700 In: 965 [P.O.:960] Out: 1510 [Urine:1500; Drains:10] Intake/Output this shift: No intake/output data recorded.  PE: General: pleasant, WD, male who is laying in bed in NAD HEENT: head is normocephalic, atraumatic. Mouth is pink and moist Heart: regular rate.  Palpable radial and pedal pulses bilaterally Lungs: Respiratory effort nonlabored Abd: soft, ND, +BS, focal TTP left abdomen around drain. Drain with dressing c/d/I and scant thin brown liquid output MS: all 4 extremities are symmetrical with no cyanosis, clubbing, or edema. Skin: warm and dry with no masses, lesions, or rashes Psych: A&Ox3 with an appropriate affect.    Lab Results:  Recent Labs    01/07/21 0107 01/08/21 0043  WBC 5.3 5.3  HGB 12.4* 12.9*  HCT 38.1* 39.3  PLT 271 286    BMET No results for input(s): NA, K, CL, CO2, GLUCOSE, BUN, CREATININE, CALCIUM in the last 72 hours.  PT/INR No results for input(s): LABPROT, INR in the last 72 hours.  CMP     Component Value Date/Time   NA 137 01/05/2021 0243   K 4.0 01/05/2021 0243   CL 102 01/05/2021 0243   CO2 28 01/05/2021 0243   GLUCOSE 98 01/05/2021 0243   BUN 10 01/05/2021 0243   CREATININE 0.93 01/05/2021 0243   CREATININE 0.88 01/03/2021 0000   CALCIUM 8.6 (L) 01/05/2021 0243   PROT 7.4 01/04/2021 0740   ALBUMIN 3.3 (L) 01/04/2021 0740   AST 26 01/04/2021 0740   ALT 38 01/04/2021 0740   ALKPHOS 64 01/04/2021 0740   BILITOT 0.9 01/04/2021  0740   GFRNONAA >60 01/05/2021 0243   GFRNONAA 88 01/03/2021 0000   GFRAA 102 01/03/2021 0000   Lipase     Component Value Date/Time   LIPASE 35 01/04/2021 0740       Studies/Results: No results found.  Anti-infectives: Anti-infectives (From admission, onward)    Start     Dose/Rate Route Frequency Ordered Stop   01/07/21 1800  amoxicillin-clavulanate (AUGMENTIN) 875-125 MG per tablet 1 tablet        1 tablet Oral Every 12 hours 01/07/21 1318     01/04/21 1800  piperacillin-tazobactam (ZOSYN) IVPB 3.375 g  Status:  Discontinued        3.375 g 12.5 mL/hr over 240 Minutes Intravenous Every 8 hours 01/04/21 1648 01/07/21 1318   01/04/21 1545  piperacillin-tazobactam (ZOSYN) IVPB 3.375 g  Status:  Discontinued        3.375 g 100 mL/hr over 30 Minutes Intravenous Every 8 hours 01/04/21 1540 01/04/21 1648   01/04/21 0930  piperacillin-tazobactam (ZOSYN) IVPB 3.375 g        3.375 g 100 mL/hr over 30 Minutes Intravenous  Once 01/04/21 7893 01/04/21 1207        Assessment/Plan  LLQ intra-abdominal abscess, unclear etiology - history of exlap, sigmoidectomy, leak >> colostomy, colostomy take down, hernia repair with mesh  - possible diverticular origin vs mesh complication from prior IPOM VHR - afebrile, WBC WNL - s/p IR  drain placement 6/24. Will need to discharge with drain and follow up with IR and [place] - follow drain culture: few gram + cocci, abundant gram - rods - multimodal pain control - scheduled tylenol, Lidoderm patch, tramadol, morphine IV for severe - ambulate and use IS  FEN: soft ID: [email or web address] VTE: SCDs, heparin gtt   Recent COVID-19 infection PE - eliquis being held. Last does 6/22 pm  Dispo: from our perspective stable for discharge on PO abx (Augmentin) with drain and close follow up with IR and [place]. I am arranging follow-up     LOS: 4 days    Winferd Humphrey, [facility] 01/08/2021, 7:36 AM Please see Amion for pager number during day  hours 7:00am-4:30pm

## 2021-01-08 NOTE — Care Management Important Message (Signed)
Important Message  Patient Details  Name: Marc Lowe MRN: [id number] Date of Birth: 1951/05/13   Medicare Important Message Given:  Yes     Orbie Pyo 01/08/2021, 4:11 PM

## 2021-01-08 NOTE — TOC Initial Note (Addendum)
Transition of Care [facility]) - Initial/Assessment Note    Patient Details  Name: Marc Lowe MRN: [id number] Date of Birth: 1950/08/07  [facility] CM/SW Contact:    Marilu Favre, RN Phone Number: 01/08/2021, 4:33 PM  Clinical Narrative:                 Patient from home with wife. Confirmed face sheet information with patient.  Patient confirms bedside nurse has completed drain care including flushes .   Referral for [facility] sent to [facility] with [facility] , await determination. [facility] accepted , information placed on AVS   Expected Discharge Plan: [place] Barriers to Discharge: No Barriers Identified   Patient Goals and CMS Choice Patient states their goals for this hospitalization and ongoing recovery are:: to return to home CMS Medicare.gov Compare Post Acute Care list provided to:: Patient Choice offered to / list presented to : Patient  Expected Discharge Plan and Services Expected Discharge Plan: [place]   Discharge Planning Services: CM Consult Post Acute Care Choice: [place] arrangements for the past 2 months: Single Family Home Expected Discharge Date: 01/08/21                 DME Agency: NA       HH Arranged: RN          Prior Living Arrangements/Services Living arrangements for the past 2 months: Single Family Home Lives with:: Spouse Patient language and need for interpreter reviewed:: Yes Do you feel safe going back to the place where you live?: Yes      Need for Family Participation in Patient Care: Yes (Comment) Care giver support system in place?: Yes (comment)   Criminal Activity/Legal Involvement Pertinent to Current Situation/Hospitalization: No - Comment as needed  Activities of Daily Living      Permission Sought/Granted   Permission granted to share information with : No              Emotional Assessment Appearance:: Appears stated age Attitude/Demeanor/Rapport:  Engaged Affect (typically observed): Accepting Orientation: : Oriented to Self, Oriented to Place, Oriented to  Time, Oriented to Situation Alcohol / Substance Use: Not Applicable Psych Involvement: No (comment)  Admission diagnosis:  Intra-abdominal abscess ([place]) [K65.1] Colonic diverticular abscess [K57.20] Patient Active Problem List   Diagnosis Date Noted   Colonic diverticular abscess 01/04/2021   Hypothyroidism (acquired) 01/04/2021   Acute deep vein thrombosis (DVT) of proximal vein of left lower extremity ([place]) 01/03/2021   History of COVID-19 01/03/2021   Pulmonary embolism ([place]) 12/28/2020   Acute respiratory failure with hypoxia ([place]) 12/27/2020   Intercostal pain 12/27/2020   Mid back pain on right side 12/27/2020   SOB (shortness of breath) 12/27/2020   COVID-19 virus infection 12/27/2020   Pulmonary embolus ([place]) 12/27/2020   Bilateral pulmonary embolism ([place]) 12/27/2020   Need for zoster vaccination 05/29/2020   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   Seborrheic keratoses 02/21/2020   Cervical radiculopathy 02/18/2020   DDD (degenerative disc disease), cervical 02/18/2020   Mixed hyperlipidemia 02/18/2020   Memory changes 02/18/2020   Anhedonia 05/13/2017   Abnormal weight gain 05/13/2017   Elevated TSH 12/18/2016   No energy 12/17/2016   Toenail fungus 12/16/2016   Left shoulder pain 11/21/2016   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   Family history of atrial fibrillation 06/11/2016   Left lower quadrant abdominal pain 05/08/2016   History of diverticulitis 05/08/2016   Cervical nerve  root impingement 04/11/2015   Hyperlipidemia 04/15/2014   History of bowel resection 04/14/2014   Testosterone deficiency 04/14/2014   DIARRHEA 03/14/2010   ABDOMINAL PAIN, GENERALIZED 03/14/2010   VARICES OF OTHER SITES 10/04/2009   IMPAIRED FASTING GLUCOSE 07/24/2009   PARONYCHIA, FINGER 07/13/2009   SKIN RASH 07/13/2009   ELEVATED PROSTATE SPECIFIC ANTIGEN  04/10/2009   CELLULITIS AND ABSCESS OF OTHER SPECIFIED SITE 05/09/2008   CHRONIC RHINITIS 06/04/2007   CONDYLOMA ACUMINATA 03/26/2007   HYPOGONADISM, MALE 11/04/2006   Depression, recurrent ([place]) 11/04/2006   ACNE ROSACEA 10/07/2006   LIBIDO, DECREASED 10/07/2006   PCP:  Donella Stade, PA-C Pharmacy:   [facility] [address], [place] [address] Phone: [phone] Fax: [phone]  [facility] [address] [place] [address] Phone: [phone] Fax: [phone]     Social Determinants of Health (SDOH) Interventions    Readmission Risk Interventions No flowsheet data found.

## 2021-01-09 ENCOUNTER — Other Ambulatory Visit: Payer: Self-pay | Admitting: Surgery

## 2021-01-09 ENCOUNTER — Telehealth: Payer: Self-pay | Admitting: General Practice

## 2021-01-09 DIAGNOSIS — K651 Peritoneal abscess: Secondary | ICD-10-CM

## 2021-01-09 NOTE — Telephone Encounter (Signed)
Transition Care Management Follow-up Telephone Call Date of discharge and from where: 01/08/21 from [facility] How have you been since you were released from the hospital? Doing ok. Any questions or concerns? No  Items Reviewed: Did the pt receive and understand the discharge instructions provided? Yes  Medications obtained and verified? Yes  Other? No  Any new allergies since your discharge? No  Dietary orders reviewed? Yes Do you have support at home? Yes   Home Care and Equipment/Supplies: Were home health services ordered? no   Functional Questionnaire: (I = Independent and D = Dependent) ADLs: I  Bathing/Dressing- I  Meal Prep- I  Eating- I  Maintaining continence- I  Transferring/Ambulation- I  Managing Meds- I  Follow up appointments reviewed:  PCP Hospital f/u appt confirmed? Yes  Scheduled to see Iran Planas, PA on 01/10/21 @ 0810. [facility] f/u appt confirmed? No   Are transportation arrangements needed? No  If their condition worsens, is the pt aware to call PCP or go to the Emergency Dept.? Yes Was the patient provided with contact information for the PCP's office or ED? Yes Was to pt encouraged to call back with questions or concerns? Yes

## 2021-01-10 ENCOUNTER — Other Ambulatory Visit (HOSPITAL_COMMUNITY): Payer: Self-pay

## 2021-01-10 ENCOUNTER — Encounter: Payer: Self-pay | Admitting: Physician Assistant

## 2021-01-10 ENCOUNTER — Ambulatory Visit (INDEPENDENT_AMBULATORY_CARE_PROVIDER_SITE_OTHER): Payer: Medicare Other | Admitting: Physician Assistant

## 2021-01-10 ENCOUNTER — Telehealth (HOSPITAL_COMMUNITY): Payer: Self-pay

## 2021-01-10 ENCOUNTER — Other Ambulatory Visit: Payer: Self-pay

## 2021-01-10 VITALS — BP 98/60 | HR 82 | Wt 209.0 lb

## 2021-01-10 DIAGNOSIS — I824Y2 Acute embolism and thrombosis of unspecified deep veins of left proximal lower extremity: Secondary | ICD-10-CM | POA: Diagnosis not present

## 2021-01-10 DIAGNOSIS — I2699 Other pulmonary embolism without acute cor pulmonale: Secondary | ICD-10-CM

## 2021-01-10 DIAGNOSIS — K5720 Diverticulitis of large intestine with perforation and abscess without bleeding: Secondary | ICD-10-CM | POA: Diagnosis not present

## 2021-01-10 LAB — AEROBIC/ANAEROBIC CULTURE W GRAM STAIN (SURGICAL/DEEP WOUND)

## 2021-01-10 NOTE — Progress Notes (Signed)
Subjective:    Patient ID: Marc Lowe, male    DOB: [birth year], 70 y.o.   MRN: [id number]  HPI Pt is a 21 male with recent COVID, pulmonary embolism, left DVT, diverticular abscess who presents to the clinic for hospital follow-up.  He was admitted to the hospital on 01/04/2021 after hospital follow-up for PE and DVT with complaints of left lower quadrant pain.  Patient has a history of diverticulitis and CT scan was obtained.  It did show an abscess.  He did have to have abscess drained in the hospital.  He was discharged on 01/08/2021.  He is back on Eliquis.  He is doing much better.  He denies any fever, chills, body aches.  He is able to tolerate food at this time.  He continues to have a left lower quadrant drain. He is tender around the drain incision but so much better than was.  He is to follow-up with his gastroenterologist next Wednesday.  He does have home health nurse coming out twice a week to help him with wound and drain care.  He was discharged on Flagyl and cefdinir.    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   HYPOGONADISM, MALE 11/04/2006   Depression, recurrent ([place]) 11/04/2006   VARICES OF OTHER SITES 10/04/2009   CHRONIC RHINITIS 06/04/2007   PARONYCHIA, FINGER 07/13/2009   CELLULITIS AND ABSCESS OF OTHER SPECIFIED SITE 05/09/2008   ACNE ROSACEA 10/07/2006   SKIN RASH 07/13/2009   DIARRHEA 03/14/2010   ABDOMINAL PAIN, GENERALIZED 03/14/2010   IMPAIRED FASTING GLUCOSE 07/24/2009   ELEVATED PROSTATE SPECIFIC ANTIGEN 04/10/2009   LIBIDO, DECREASED 10/07/2006   History of bowel resection 04/14/2014   Testosterone deficiency 04/14/2014   Hyperlipidemia 04/15/2014   Cervical nerve root impingement 04/11/2015   Left lower quadrant abdominal pain 05/08/2016   History of diverticulitis 05/08/2016   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   Family history of atrial fibrillation 06/11/2016   Left shoulder pain 11/21/2016    Toenail fungus 12/16/2016   No energy 12/17/2016   Elevated TSH 12/18/2016   Anhedonia 05/13/2017   Abnormal weight gain 05/13/2017   Cervical radiculopathy 02/18/2020   DDD (degenerative disc disease), cervical 02/18/2020   Mixed hyperlipidemia 02/18/2020   Memory changes 02/18/2020   Seborrheic keratoses 02/21/2020   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   Need for zoster vaccination 05/29/2020   Acute respiratory failure with hypoxia ([place]) 12/27/2020   Intercostal pain 12/27/2020   Mid back pain on right side 12/27/2020   SOB (shortness of breath) 12/27/2020   COVID-19 virus infection 12/27/2020   Pulmonary embolus ([place]) 12/27/2020   Bilateral pulmonary embolism ([place]) 12/27/2020   Pulmonary embolism ([place]) 12/28/2020   Acute deep vein thrombosis (DVT) of proximal vein of left lower extremity ([place]) 01/03/2021   History of COVID-19 01/03/2021   Colonic diverticular abscess 01/04/2021   Hypothyroidism (acquired) 01/04/2021   Resolved Ambulatory Problems    Diagnosis Date Noted   HYPERLIPIDEMIA 10/07/2006   VIRAL URI 05/09/2008   Localized swelling of left lower extremity 07/02/2019   Past Medical History:  Diagnosis Date   BPH (benign prostatic hyperplasia)    Depression    Diverticulitis    Pulmonary emboli ([place])      Review of Systems  All other systems reviewed and are negative.     Objective:   Physical Exam Vitals reviewed.  Constitutional:      Appearance: Normal appearance.  HENT:  Head: Normocephalic.  Cardiovascular:     Rate and Rhythm: Normal rate and regular rhythm.  Pulmonary:     Effort: Pulmonary effort is normal.     Breath sounds: Normal breath sounds.  Abdominal:     General: There is no distension.     Palpations: Abdomen is soft.     Tenderness: There is abdominal tenderness. There is no right CVA tenderness or left CVA tenderness.     Comments: Left lower quadrant drain.   Neurological:     General: No focal deficit  present.     Mental Status: He is alert and oriented to person, place, and time.  Psychiatric:        Mood and Affect: Mood normal.     Assessment & Plan:  [name] KitchenMarland KitchenJakaree was seen today for hospitalization follow-up.  Diagnoses and all orders for this visit:  Colonic diverticular abscess -     CBC with Differential/Platelet -     COMPLETE METABOLIC PANEL WITH GFR  Bilateral pulmonary embolism (HCC)  Acute deep vein thrombosis (DVT) of proximal vein of left lower extremity (HCC)   Recheck CBC and CMP today from hospital.  Wound appears clean and home health is helping him with drain care.  Vitals are stable.  Keep gastroenterology, hematology appts for follow up.  Stay on Eliquis.  Finish both antibiotics.  Call with any fever, chills, SOB, body aches.  Continue to work on nutrition and regaining strength.  Follow up in 4 weeks.  Filled out paperwork for wife to help patient and be at home while patient is recovering.

## 2021-01-10 NOTE — Progress Notes (Signed)
CBC improved. HgB up and normal now. No elevation of WBC.

## 2021-01-10 NOTE — Telephone Encounter (Signed)
Transitions of Care Pharmacy  ° °Call attempted for a pharmacy transitions of care follow-up. HIPAA appropriate voicemail was left with call back information provided.  ° °Call attempt #1. Will follow-up in 2-3 days.  °  °

## 2021-01-11 ENCOUNTER — Telehealth (HOSPITAL_COMMUNITY): Payer: Self-pay | Admitting: Pharmacist

## 2021-01-11 LAB — CBC WITH DIFFERENTIAL/PLATELET
Absolute Monocytes: 611 cells/uL (ref 200–950)
Basophils Absolute: 19 cells/uL (ref 0–200)
Basophils Relative: 0.3 %
Eosinophils Absolute: 50 cells/uL (ref 15–500)
Eosinophils Relative: 0.8 %
HCT: 43 % (ref 38.5–50.0)
Hemoglobin: 14 g/dL (ref 13.2–17.1)
Lymphs Abs: 1090 cells/uL (ref 850–3900)
MCH: 28.7 pg (ref 27.0–33.0)
MCHC: 32.6 g/dL (ref 32.0–36.0)
MCV: 88.1 fL (ref 80.0–100.0)
MPV: 9.5 fL (ref 7.5–12.5)
Monocytes Relative: 9.7 %
Neutro Abs: 4530 cells/uL (ref 1500–7800)
Neutrophils Relative %: 71.9 %
Platelets: 332 10*3/uL (ref 140–400)
RBC: 4.88 10*6/uL (ref 4.20–5.80)
RDW: 13.1 % (ref 11.0–15.0)
Total Lymphocyte: 17.3 %
WBC: 6.3 10*3/uL (ref 3.8–10.8)

## 2021-01-11 LAB — COMPLETE METABOLIC PANEL WITH GFR
AG Ratio: 1.2 (calc) (ref 1.0–2.5)
ALT: 46 U/L (ref 9–46)
AST: 34 U/L (ref 10–35)
Albumin: 3.8 g/dL (ref 3.6–5.1)
Alkaline phosphatase (APISO): 68 U/L (ref 35–144)
BUN: 18 mg/dL (ref 7–25)
CO2: 29 mmol/L (ref 20–32)
Calcium: 9.6 mg/dL (ref 8.6–10.3)
Chloride: 100 mmol/L (ref 98–110)
Creat: 0.94 mg/dL (ref 0.70–1.25)
GFR, Est African American: 96 mL/min/{1.73_m2} (ref >=60)
GFR, Est Non African American: 83 mL/min/{1.73_m2} (ref >=60)
Globulin: 3.2 g/dL (calc) (ref 1.9–3.7)
Glucose, Bld: 98 mg/dL (ref 65–99)
Potassium: 4.6 mmol/L (ref 3.5–5.3)
Sodium: 138 mmol/L (ref 135–146)
Total Bilirubin: 0.4 mg/dL (ref 0.2–1.2)
Total Protein: 7 g/dL (ref 6.1–8.1)

## 2021-01-11 NOTE — Progress Notes (Signed)
Hagan,   Hemoglobin looks much better.  Kidney and liver looks good.  Electrolytes great.  WBC normal.   Looking good.

## 2021-01-11 NOTE — Telephone Encounter (Signed)
Pharmacy Transitions of Care Follow-up Telephone Call  Date of discharge: 12/29/2020    How have you been since you were released from the hospital? Good   Medication changes made at discharge:  - START: cefdinir, lidocaine, metronidazole, percocet, tramadol, eliquis  Medication changes verified by the patient? Yes (Yes/No)    Medication Accessibility    Medication Review:   APIXABAN (ELIQUIS)  Apixaban 10 mg BID initiated on 12/29/20. Will switch to apixaban 5 mg BID after 7 days .  - Discussed importance of taking medication around the same time everyday  - Reviewed potential DDIs with patient  - Advised patient of medications to avoid (NSAIDs, ASA)  - Educated that Tylenol (acetaminophen) will be the preferred analgesic to prevent risk of bleeding  - Emphasized importance of monitoring for signs and symptoms of bleeding (abnormal bruising, prolonged bleeding, nose bleeds, bleeding from gums, discolored urine, black tarry stools)  - Advised patient to alert all providers of anticoagulation therapy prior to starting a new medication or having a procedure   Follow-up Appointments:  PCP Hospital f/u appt confirmed? Yes  Specialist Hospital f/u appt confirmed? Yes  If their condition worsens, is the pt aware to call PCP or go to the Emergency Dept.? Yes  Final Patient Assessment: Patient reports he is doing well and had no questions.  Reviewed medications and possible side effects.

## 2021-01-12 ENCOUNTER — Ambulatory Visit (INDEPENDENT_AMBULATORY_CARE_PROVIDER_SITE_OTHER): Payer: Medicare Other | Admitting: Pharmacist

## 2021-01-12 ENCOUNTER — Other Ambulatory Visit: Payer: Self-pay

## 2021-01-12 DIAGNOSIS — E782 Mixed hyperlipidemia: Secondary | ICD-10-CM

## 2021-01-12 DIAGNOSIS — I2699 Other pulmonary embolism without acute cor pulmonale: Secondary | ICD-10-CM

## 2021-01-12 DIAGNOSIS — R7301 Impaired fasting glucose: Secondary | ICD-10-CM

## 2021-01-12 NOTE — Progress Notes (Signed)
Chronic Care Management Pharmacy Note  01/16/2021 Name:  Marc Lowe MRN:  [id number] DOB:  [birth year]  Summary: addressed impaired fasting glucose, HLD, and PE/anticoagulation  Recommendations/Changes made from today's visit: none  Plan: f/u with pharmacist in 7 months  Subjective: Marc Lowe is an 70 y.o. year old male who is a primary patient of Donella Stade, PA-C.  The CCM team was consulted for assistance with disease management and care coordination needs.    Engaged with patient by telephone for initial visit in response to provider referral for pharmacy case management and/or care coordination services.   Consent to Services:  The patient was given information about Chronic Care Management services, agreed to services, and gave verbal consent prior to initiation of services.  Please see initial visit note for detailed documentation.   Patient Care Team: Lavada Mesi as PCP - General (Family Medicine) Darius Bump, [facility] as Pharmacist (Pharmacist)  Recent office visits: 01/10/21 - [place] Planas (PCP) hospital follow-up  Recent consult visits: 02/06/21 - upcoming, Laverna Peace (heme/onc) coagulopathy workup & duration of [facility] visits: 01/04/21 - colonic diverticular abscess 12/27/20 - bilateral PE  Objective:  Lab Results  Component Value Date   CREATININE 0.94 01/10/2021   CREATININE 0.93 01/05/2021   CREATININE 0.89 01/04/2021    Lab Results  Component Value Date   HGBA1C 5.6 08/23/2020       Component Value Date/Time   CHOL 205 (H) 05/29/2020 0000   TRIG 100 05/29/2020 0000   HDL 57 05/29/2020 0000   CHOLHDL 3.6 05/29/2020 0000   VLDL 30 12/16/2016 1016   LDLCALC 127 (H) 05/29/2020 0000   LDLDIRECT 143 (H) 04/11/2009 0411    Hepatic Function Latest Ref Rng & Units 01/10/2021 01/04/2021 01/03/2021  Total Protein 6.1 - 8.1 g/dL 7.0 7.4 6.8  Albumin 3.5 - 5.0 g/dL - 3.3(L) -  AST 10 - 35 U/L 34 26 20  ALT 9 - 46  U/L 46 38 30  Alk Phosphatase 38 - 126 U/L - 64 -  Total Bilirubin 0.2 - 1.2 mg/dL 0.4 0.9 0.5  Bilirubin, Direct 0.0 - 0.3 mg/dL - - -    Lab Results  Component Value Date/Time   TSH 2.20 08/23/2020 12:00 AM   TSH 4.50 05/29/2020 12:00 AM    CBC Latest Ref Rng & Units 01/10/2021 01/08/2021 01/07/2021  WBC 3.8 - 10.8 Thousand/uL 6.3 5.3 5.3  Hemoglobin 13.2 - 17.1 g/dL 14.0 12.9(L) 12.4(L)  Hematocrit 38.5 - 50.0 % 43.0 39.3 38.1(L)  Platelets 140 - 400 Thousand/uL 332 286 271    Lab Results  Component Value Date/Time   VD25OH 30 12/16/2016 10:16 AM    Clinical ASCVD: No  The 10-year ASCVD risk score Mikey Bussing DC Jr., et al., 2013) is: 9.7%   Values used to calculate the score:     Age: 65 years     Sex: Male     Is Non-Hispanic African American: No     Diabetic: No     Tobacco smoker: No     Systolic Blood Pressure: 98 mmHg     Is BP treated: No     HDL Cholesterol: 57 mg/dL     Total Cholesterol: 205 mg/dL     Social History   Tobacco Use  Smoking Status Never  Smokeless Tobacco Never   BP Readings from Last 3 Encounters:  01/10/21 98/60  01/08/21 101/66  01/03/21 118/81   Pulse Readings from Last 3  Encounters:  01/10/21 82  01/08/21 67  01/03/21 86   Wt Readings from Last 3 Encounters:  01/10/21 209 lb (94.8 kg)  01/04/21 206 lb (93.4 kg)  01/03/21 210 lb (95.3 kg)    Assessment: Review of patient past medical history, allergies, medications, health status, including review of consultants reports, laboratory and other test data, was performed as part of comprehensive evaluation and provision of chronic care management services.   SDOH:  (Social Determinants of Health) assessments and interventions performed:    CCM Care Plan  Allergies  Allergen Reactions   Nsaids Other (See Comments)    SHOULD NOT HAVE THIS CLASS OF MEDICATION- HISTORY OF DIVERTICULITIS   Hydromorphone Nausea And Vomiting   Atorvastatin Nausea Only and Other (See Comments)     Abdominal pain and reflux, also   Hydromorphone Hcl Nausea And Vomiting   Rosuvastatin Nausea And Vomiting   Zetia [Ezetimibe] Nausea Only and Other (See Comments)    Caused an upset stomach and made patient's body hurt    Medications Reviewed Today     Reviewed by Darius Bump, [facility] (Pharmacist) on 01/12/21 at 1318  Med List Status: <None>   Medication Order Taking? Sig Documenting Provider Last Dose Status Informant  apixaban (ELIQUIS) 5 MG TABS tablet [id number] Yes Take 1 tablet (5 mg total) by mouth 2 (two) times daily. Donella Stade, PA-C Taking Active Multiple Informants           Med Note Marcello Fennel Jan 04, 2021 11:56 AM) Plan to transition to 5 mg twice daily (as part of starter pack) on 06.24   APIXABAN (ELIQUIS) VTE STARTER PACK (10MG AND 5MG) [id number]  Take as directed on package: start with two-80m tablets twice daily for 7 days. On day 8, switch to one-561mtablet twice daily. GhJonetta OsgoodMD  Active Multiple Informants           Med Note (AMarcello Fennelun 23, 2022 11:55 AM) Plan to transition to 5 mg twice daily on 06.24  cefdinir (OMNICEF) 300 MG capsule [id number] Take 1 capsule (300 mg total) by mouth 2 (two) times daily. SaNita SellsMD Taking Active    Patient not taking:   Discontinued 01/04/21 1321 levothyroxine (EUTHYROX) 25 MCG tablet [id number] Take 1 tablet (25 mcg total) by mouth daily before breakfast. BrAlden HippJade L, PA-C Taking Active Multiple Informants  lidocaine (XYLOCAINE) 5 % ointment [id number] Apply 1 application topically as needed. SaNita SellsMD Taking Active   metroNIDAZOLE (FLAGYL) 500 MG tablet [id number] Take 1 tablet (500 mg total) by mouth 2 (two) times daily for 10 days. SaNita SellsMD Taking Active   Sildenafil Citrate (VIAGRA PO) [id number] Take by mouth. [provider] Taking Active Multiple Informants  tamsulosin (FLOMAX) 0.4 MG CAPS capsule  [id number] Take 0.4 mg by mouth at bedtime. [provider] Taking Active Multiple Informants           Med Note (AMarcello Fennelun 23, 2022 11:40 AM) Pt emphasizes that he really needs this medication  Vilazodone HCl (VIIBRYD) 20 MG TABS [id number] Take 1 tablet (20 mg total) by mouth daily. Early, Coralee PesaNP Taking Active Multiple Informants           Med Note (DDuffy BruceKADeretha Emoryun 15, 2022  7:38 PM) 0.5 tablet = 10 mg  Patient Active Problem List   Diagnosis Date Noted   Colonic diverticular abscess 01/04/2021   Hypothyroidism (acquired) 01/04/2021   Acute deep vein thrombosis (DVT) of proximal vein of left lower extremity ([place]) 01/03/2021   History of COVID-19 01/03/2021   Pulmonary embolism ([employer]) 12/28/2020   Acute respiratory failure with hypoxia ([place]) 12/27/2020   Intercostal pain 12/27/2020   Mid back pain on right side 12/27/2020   SOB (shortness of breath) 12/27/2020   COVID-19 virus infection 12/27/2020   Pulmonary embolus ([place]) 12/27/2020   Bilateral pulmonary embolism ([place]) 12/27/2020   Need for zoster vaccination 05/29/2020   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   Seborrheic keratoses 02/21/2020   Cervical radiculopathy 02/18/2020   DDD (degenerative disc disease), cervical 02/18/2020   Mixed hyperlipidemia 02/18/2020   Memory changes 02/18/2020   Anhedonia 05/13/2017   Abnormal weight gain 05/13/2017   Elevated TSH 12/18/2016   No energy 12/17/2016   Toenail fungus 12/16/2016   Left shoulder pain 11/21/2016   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   Family history of atrial fibrillation 06/11/2016   Left lower quadrant abdominal pain 05/08/2016   History of diverticulitis 05/08/2016   Cervical nerve root impingement 04/11/2015   Hyperlipidemia 04/15/2014   History of bowel resection 04/14/2014   Testosterone deficiency 04/14/2014   DIARRHEA 03/14/2010   ABDOMINAL PAIN, GENERALIZED 03/14/2010   VARICES OF  OTHER SITES 10/04/2009   IMPAIRED FASTING GLUCOSE 07/24/2009   PARONYCHIA, FINGER 07/13/2009   SKIN RASH 07/13/2009   ELEVATED PROSTATE SPECIFIC ANTIGEN 04/10/2009   CELLULITIS AND ABSCESS OF OTHER SPECIFIED SITE 05/09/2008   CHRONIC RHINITIS 06/04/2007   CONDYLOMA ACUMINATA 03/26/2007   HYPOGONADISM, MALE 11/04/2006   Depression, recurrent ([place]) 11/04/2006   ACNE ROSACEA 10/07/2006   LIBIDO, DECREASED 10/07/2006    Immunization History  Administered Date(s) Administered   PFIZER(Purple Top)SARS-COV-2 Vaccination 09/30/2019, 10/21/2019   Pneumococcal Polysaccharide-23 05/06/2018   Tdap 04/14/2014   Zoster, Live 04/14/2014    Conditions to be addressed/monitored: HLD, impaired fasting glucose, PE/anticoagulation  Care Plan : Medication Management  Updates made by Darius Bump, [place] since 01/16/2021 12:00 AM     Problem: Impaired fasting glucose, HLD, PE      Long-Range Goal: Disease Progression Prevention   Start Date: 01/12/2021  This Visit's Progress: On track  Priority: High  Note:   Current Barriers:  None at present  Pharmacist Clinical Goal(s):  Over the next 180 days, patient will maintain control of chronic conditions as evidenced by medication fill history, lab values, and vital signs  through collaboration with PharmD and provider.   Interventions: 1:1 collaboration with Donella Stade, PA-C regarding development and update of comprehensive plan of care as evidenced by provider attestation and co-signature Inter-disciplinary care team collaboration (see longitudinal plan of care) Comprehensive medication review performed; medication list updated in electronic medical record  Impaired Fasting Glucose:  Controlled; current treatment:lifestyle modifications only;   Current meal patterns: breakfast: oatmeal, frozen strawberries; lunch: skipped, clif bar sometimes; dinner: [place], chicken, does not like vegetables, salad, ; snacks: doesn't snack; drinks:  gatorade, working on water intake, ice tea  Current exercise: active with construction work lifestyle  Recommended continue current regimen,  Hyperlipidemia:  Uncontrolled; current treatment:lifestyle modifications only;   Medications previously tried: atorvastatin, rosuvastatin, zetia (intolerances documented in allergies)   Counseled on importance of dietary choices to offset intolerance to medication Recommended continue current management, see visit instructions for information regarding cholesterol in food Pulmonary Embolus  Current medication: eliquis 76m  BID  Counseled on s/sx of bleeding Recommended continue current regimen, duration of anticoag and coagulopathy workup pending from heme consult  Patient Goals/Self-Care Activities Over the next 180 days, patient will:  take medications as prescribed  Follow Up Plan: Telephone follow up appointment with care management team member scheduled for:  7 months      Medication Assistance: None required.  Patient affirms current coverage meets needs.  Patient's preferred pharmacy is:  [facility] [address], [place] [address] Phone: [phone] Fax: [place] [address] [place] [address] Phone: [phone] Fax: [phone]   Follow Up:  Patient agrees to Care Plan and Follow-up.  Plan: Telephone follow up appointment with care management team member scheduled for:  7 months  Darius Bump

## 2021-01-16 NOTE — Patient Instructions (Signed)
Cholesterol Content in Foods Cholesterol is a waxy, fat-like substance that helps to carry fat in the blood. The body needs cholesterol in small amounts, but too much cholesterol can causedamage to the arteries and heart. Most people should eat less than 200 milligrams (mg) of cholesterol a day. Foods with cholesterol  Cholesterol is found in animal-based foods, such as meat, seafood, and dairy. Generally, low-fat dairy and lean meats have less cholesterol than full-fat dairy and fatty meats. The milligrams of cholesterol per serving (mg per serving) of common cholesterol-containing foods are listed below. Meat and other proteins Egg -- one large whole egg has 186 mg. Veal shank -- 4 oz has 141 mg. Lean ground turkey (93% lean) -- 4 oz has 118 mg. Fat-trimmed lamb loin -- 4 oz has 106 mg. Lean ground beef (90% lean) -- 4 oz has 100 mg. Lobster -- 3.5 oz has 90 mg. Pork loin chops -- 4 oz has 86 mg. Canned salmon -- 3.5 oz has 83 mg. Fat-trimmed beef top loin -- 4 oz has 78 mg. Frankfurter -- 1 frank (3.5 oz) has 77 mg. Crab -- 3.5 oz has 71 mg. Roasted chicken without skin, white meat -- 4 oz has 66 mg. Light bologna -- 2 oz has 45 mg. Deli-cut turkey -- 2 oz has 31 mg. Canned tuna -- 3.5 oz has 31 mg. Bacon -- 1 oz has 29 mg. Oysters and mussels (raw) -- 3.5 oz has 25 mg. Mackerel -- 1 oz has 22 mg. Trout -- 1 oz has 20 mg. Pork sausage -- 1 link (1 oz) has 17 mg. Salmon -- 1 oz has 16 mg. Tilapia -- 1 oz has 14 mg. Dairy Soft-serve ice cream --  cup (4 oz) has 103 mg. Whole-milk yogurt -- 1 cup (8 oz) has 29 mg. Cheddar cheese -- 1 oz has 28 mg. American cheese -- 1 oz has 28 mg. Whole milk -- 1 cup (8 oz) has 23 mg. 2% milk -- 1 cup (8 oz) has 18 mg. Cream cheese -- 1 tablespoon (Tbsp) has 15 mg. Cottage cheese --  cup (4 oz) has 14 mg. Low-fat (1%) milk -- 1 cup (8 oz) has 10 mg. Sour cream -- 1 Tbsp has 8.5 mg. Low-fat yogurt -- 1 cup (8 oz) has 8 mg. Nonfat Greek  yogurt -- 1 cup (8 oz) has 7 mg. Half-and-half cream -- 1 Tbsp has 5 mg. Fats and oils Cod liver oil -- 1 tablespoon (Tbsp) has 82 mg. Butter -- 1 Tbsp has 15 mg. Lard -- 1 Tbsp has 14 mg. Bacon grease -- 1 Tbsp has 14 mg. Mayonnaise -- 1 Tbsp has 5-10 mg. Margarine -- 1 Tbsp has 3-10 mg. Exact amounts of cholesterol in these foods may vary depending on specificingredients and brands. Foods without cholesterol Most plant-based foods do not have cholesterol unless you combine them with a food that has cholesterol. Foods without cholesterol include: Grains and cereals. Vegetables. Fruits. Vegetable oils, such as olive, canola, and sunflower oil. Legumes, such as peas, beans, and lentils. Nuts and seeds. Egg whites. Summary The body needs cholesterol in small amounts, but too much cholesterol can cause damage to the arteries and heart. Most people should eat less than 200 milligrams (mg) of cholesterol a day. This information is not intended to replace advice given to you by your health care provider. Make sure you discuss any questions you have with your healthcare provider. Document Revised: 10/12/2019 Document Reviewed: 11/22/2019 Elsevier Patient Education  2022   Elsevier Inc[employer]  Visit Information   PATIENT GOALS:   Goals Addressed             This Visit's Progress    Medication Management       Patient Goals/Self-Care Activities Over the next 180 days, patient will:  take medications as prescribed  Follow Up Plan: Telephone follow up appointment with care management team member scheduled for:  7 months          Consent to CCM Services: Mr. Knick was given information about Chronic Care Management services today including:  CCM service includes personalized support from designated clinical staff supervised by his physician, including individualized plan of care and coordination with other care providers 24/7 contact phone numbers for assistance for urgent and  routine care needs. Service will only be billed when office clinical staff spend 20 minutes or more in a month to coordinate care. Only one practitioner may furnish and bill the service in a calendar month. The patient may stop CCM services at any time (effective at the end of the month) by phone call to the office staff. The patient will be responsible for cost sharing (co-pay) of up to 20% of the service fee (after annual deductible is met).  Patient agreed to services and verbal consent obtained.   Patient verbalizes understanding of instructions provided today and agrees to view in [place].   Telephone follow up appointment with care management team member scheduled for: 7 months  [address]: Patient Care Plan: Medication Management     Problem Identified: Impaired fasting glucose, HLD, PE      Long-Range Goal: Disease Progression Prevention   Start Date: 01/12/2021  This Visit's Progress: On track  Priority: High  Note:   Current Barriers:  None at present  Pharmacist Clinical Goal(s):  Over the next 180 days, patient will maintain control of chronic conditions as evidenced by medication fill history, lab values, and vital signs  through collaboration with PharmD and provider.   Interventions: 1:1 collaboration with Donella Stade, PA-C regarding development and update of comprehensive plan of care as evidenced by provider attestation and co-signature Inter-disciplinary care team collaboration (see longitudinal plan of care) Comprehensive medication review performed; medication list updated in electronic medical record  Impaired Fasting Glucose:  Controlled; current treatment:lifestyle modifications only;   Current meal patterns: breakfast: oatmeal, frozen strawberries; lunch: skipped, clif bar sometimes; dinner: [place], chicken, does not like vegetables, salad, ; snacks: doesn't snack; drinks: gatorade, working on water intake, ice tea  Current  exercise: active with construction work lifestyle  Recommended continue current regimen,  Hyperlipidemia:  Uncontrolled; current treatment:lifestyle modifications only;   Medications previously tried: atorvastatin, rosuvastatin, zetia (intolerances documented in allergies)   Counseled on importance of dietary choices to offset intolerance to medication Recommended continue current management, see visit instructions for information regarding cholesterol in food Pulmonary Embolus  Current medication: eliquis 5mg  BID  Counseled on s/sx of bleeding Recommended continue current regimen, duration of anticoag and coagulopathy workup pending from heme consult  Patient Goals/Self-Care Activities Over the next 180 days, patient will:  take medications as prescribed  Follow Up Plan: Telephone follow up appointment with care management team member scheduled for:  7 months

## 2021-01-17 ENCOUNTER — Ambulatory Visit
Admission: RE | Admit: 2021-01-17 | Discharge: 2021-01-17 | Disposition: A | Discharge status: Home / Self Care | Payer: Medicare Other | Source: Ambulatory Visit | Attending: Surgery | Admitting: Surgery

## 2021-01-17 ENCOUNTER — Encounter: Payer: Self-pay | Admitting: Physician Assistant

## 2021-01-17 ENCOUNTER — Ambulatory Visit
Admission: RE | Admit: 2021-01-17 | Discharge: 2021-01-17 | Disposition: A | Discharge status: Home / Self Care | Payer: Medicare Other | Source: Ambulatory Visit | Attending: Student | Admitting: Student

## 2021-01-17 DIAGNOSIS — K651 Peritoneal abscess: Secondary | ICD-10-CM

## 2021-01-17 HISTORY — PX: IR RADIOLOGIST EVAL & MGMT: IMG5224

## 2021-01-17 MED ORDER — IOPAMIDOL (ISOVUE-300) INJECTION 61%
100.0000 mL | Freq: Once | INTRAVENOUS | Status: AC | PRN
  Administered 2021-01-17: 100 mL via INTRAVENOUS

## 2021-01-17 NOTE — Progress Notes (Signed)
Chief Complaint: Patient was seen in consultation today for follow up of intra-abdominal abscess drain  Referring Physician(s): Stechschulte, Eddie Dibbles, MD  Supervising Physician: Sandi Mariscal  History of Present Illness: Marc Lowe is a 70 y.o. male with past medical history significant for depression, hypothyroidism, BPH, diverticulitis and PE on Eliquis who presented to [facility] ED on 01/04/21 after results of outpatient CT showed LLQ abdominal abscess for which he ultimately underwent percutaneous drain placement in IR on 6/24. He was discharged home on 6/27 with the drain in place and presents today for follow up CT and drain evaluation.  Marc Lowe has been feeling well overall. He has been flushing the drain once daily with 5 mL of normal saline and has had essentially no output from the drain for the last 3 days. He denies any fevers, chills, n/v or abdominal pain. He is having regular bowel movements and tolerating his typical diet. He has not been seen by general surgery yet and is unsure if he has a follow up scheduled as his wife keeps track of his appointments right now. He is currently taking oral antibiotics without issue.   Past Medical History:  Diagnosis Date   BPH (benign prostatic hyperplasia)    Depression    Diverticulitis    Hypothyroidism (acquired)    Pulmonary emboli [facility])     Past Surgical History:  Procedure Laterality Date   COLON RESECTION SIGMOID     2005   history of colostomy  2005   s/p takedown with mesh    Allergies: Nsaids, Hydromorphone, Atorvastatin, Hydromorphone hcl, Rosuvastatin, and Zetia [ezetimibe]  Medications: Prior to Admission medications   Medication Sig Start Date End Date Taking? Authorizing Provider  apixaban (ELIQUIS) 5 MG TABS tablet Take 1 tablet (5 mg total) by mouth 2 (two) times daily. 01/03/21   Donella Stade, PA-C  APIXABAN (ELIQUIS) VTE STARTER PACK (10MG  AND 5MG ) Take as directed on package: start with two-5mg   tablets twice daily for 7 days. On day 8, switch to one-5mg  tablet twice daily. 12/29/20   Ghimire, Henreitta Leber, MD  cefdinir (OMNICEF) 300 MG capsule Take 1 capsule (300 mg total) by mouth 2 (two) times daily. 01/08/21   Nita Sells, MD  levothyroxine (EUTHYROX) 25 MCG tablet Take 1 tablet (25 mcg total) by mouth daily before breakfast. 08/24/20   Breeback, Jade L, PA-C  lidocaine (XYLOCAINE) 5 % ointment Apply 1 application topically as needed. 01/08/21   Nita Sells, MD  metroNIDAZOLE (FLAGYL) 500 MG tablet Take 1 tablet (500 mg total) by mouth 2 (two) times daily for 10 days. 01/08/21 01/18/21  Nita Sells, MD  Sildenafil Citrate (VIAGRA PO) Take by mouth.    [provider]  tamsulosin (FLOMAX) 0.4 MG CAPS capsule Take 0.4 mg by mouth at bedtime. 11/18/20   [provider]  Vilazodone HCl (VIIBRYD) 20 MG TABS Take 1 tablet (20 mg total) by mouth daily. 05/29/20   Orma Render, NP  ezetimibe (ZETIA) 10 MG tablet Take 1 tablet (10 mg total) by mouth daily. Patient not taking: No sig reported 03/16/20 01/04/21  Donella Stade, PA-C     Family History  Problem Relation Age of Onset   Atrial fibrillation Father     Social History   Socioeconomic History   Marital status: Married    Spouse name: Tillman Abide   Number of children: 4   Years of education: 14   Highest education level: Bachelor's degree (e.g., BA, AB, BS)  Occupational History   Occupation: Chief Strategy Officer    Comment: retired  Tobacco Use   Smoking status: Never   Smokeless tobacco: Never  Vaping Use   Vaping Use: Never used  Substance and Sexual Activity   Alcohol use: Yes    Alcohol/week: 3.0 standard drinks    Types: 3 Glasses of wine per week    Comment: 4 ounces every couple of days   Drug use: No   Sexual activity: Yes    Partners: Female  Other Topics Concern   Not on file  Social History Narrative   Plays golf once a week. 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 Strain: Low Risk    Difficulty of Paying Living Expenses: Not hard at all  Food Insecurity: No Food Insecurity   Worried About Charity fundraiser in the Last Year: Never true   Arboriculturist in the Last Year: Never true  Transportation Needs: No Transportation Needs   Lack of Transportation (Medical): No   Lack of Transportation (Non-Medical): No  Physical Activity: Sufficiently Active   Days of Exercise per Week: 5 days   Minutes of Exercise per Session: 100 min  Stress: No Stress Concern Present   Feeling of Stress : Not at all  Social Connections: Moderately Integrated   Frequency of Communication with Friends and Family: More than three times a week   Frequency of Social Gatherings with Friends and Family: More than three times a week   Attends Religious Services: Never   Marine scientist or Organizations: Yes   Attends Music therapist: More than 4 times per year   Marital Status: Married   Review of Systems: A 12 point ROS discussed and pertinent positives are indicated in the HPI above.  All other systems are negative.  Review of Systems  Constitutional:  Negative for appetite change, chills and fever.  Respiratory:  Negative for cough and shortness of breath.   Cardiovascular:  Negative for chest pain.  Gastrointestinal:  Negative for abdominal pain, diarrhea, nausea and vomiting.   Vital Signs: There were no vitals taken for this visit.  Physical Exam Constitutional:      General: He is not in acute distress. HENT:     Head: Normocephalic.  Pulmonary:     Effort: Pulmonary effort is normal.  Abdominal:     Palpations: Abdomen is soft.     Comments: (+) LLQ drain to suction with scant serous output. Suture and stat lock in tact. Insertion site unremarkable.  Drain injection performed with 20 cc omnipaque today which shows collapse abscess cavity without obvious fistula.  Skin:     General: Skin is warm and dry.  Neurological:     Mental Status: He is alert. Mental status is at baseline.     Imaging: CT Angio Chest W/Cm &/Or Wo Cm  Result Date: 12/27/2020 CLINICAL DATA:  Increasing shortness of breath over the past few weeks, initial encounter EXAM: CT ANGIOGRAPHY CHEST WITH CONTRAST TECHNIQUE: Multidetector CT imaging of the chest was performed using the standard protocol during bolus administration of intravenous contrast. Multiplanar CT image reconstructions and MIPs were obtained to evaluate the vascular anatomy. CONTRAST:  129mL OMNIPAQUE IOHEXOL 350 MG/ML SOLN COMPARISON:  None. FINDINGS: Cardiovascular: Thoracic aorta and its branches demonstrate mild atherosclerotic calcification. No cardiac enlargement is seen. Pulmonary artery demonstrates multiple filling defects primarily on the right consistent with pulmonary emboli. No  significant right heart strain is identified. Mild coronary calcifications are seen. Mediastinum/Nodes: Thoracic inlet is within normal limits. No sizable hilar or mediastinal adenopathy is noted. The esophagus as visualized is within normal limits. Lungs/Pleura: Small right pleural effusion is noted. Mild bibasilar atelectatic changes are seen slightly worse on the right than the left likely related to the underlying pulmonary emboli. No sizable parenchymal nodule is seen. Upper Abdomen: Visualized upper abdomen is within normal limits. Musculoskeletal: Degenerative changes of the thoracic spine are noted. Review of the MIP images confirms the above findings. IMPRESSION: Bilateral pulmonary emboli right greater than left with associated atelectatic changes in the bases as well as a small right pleural effusion. No right heart strain is seen. Aortic Atherosclerosis (ICD10-I70.0). These results will be called to the ordering clinician or representative by the Radiologist Assistant, and communication documented in the PACS or [employer]. Electronically  Signed   By: Inez Catalina M.D.   On: 12/27/2020 11:00   CT Abdomen Pelvis W Contrast  Result Date: 01/03/2021 CLINICAL DATA:  History of diverticulitis. Hyperactive bowel sounds. EXAM: CT ABDOMEN AND PELVIS WITH CONTRAST TECHNIQUE: Multidetector CT imaging of the abdomen and pelvis was performed using the standard protocol following bolus administration of intravenous contrast. CONTRAST:  127mL OMNIPAQUE IOHEXOL 300 MG/ML  SOLN COMPARISON:  05/10/2016 FINDINGS: Lower chest: Small right pleural effusion. Right basilar airspace disease. Hepatobiliary: No focal liver abnormality is seen. No gallstones, gallbladder wall thickening, or biliary dilatation. Pancreas: Unremarkable. No pancreatic ductal dilatation or surrounding inflammatory changes. Spleen: Normal in size without focal abnormality. Adrenals/Urinary Tract: Normal adrenal glands. No urolithiasis or obstructive uropathy. Dystrophic left renal cortical calcification. Decompressed bladder. Stomach/Bowel: No bowel dilatation to suggest bowel obstruction. Bowel wall thickening of multiple small bowel loops in the left anterior abdomen. 4.4 x 4.8 x 4 cm complex fluid collection interspersed within the bowel loops in the left mid abdomen with air in the collection most consistent with an abscess. No pneumatosis, pneumoperitoneum or portal venous gas. Vascular/Lymphatic: Normal caliber abdominal aorta with mild atherosclerosis. No lymphadenopathy. Reproductive: Prostate is unremarkable. Other: Anterior abdominal wall hernia repair. Small right spigelian hernia of the right without incarceration. Musculoskeletal: Degenerative disease with disc height loss at L4-5 and L5-S1 with bilateral facet arthropathy. No acute osseous abnormality. No aggressive osseous lesion. IMPRESSION: 1. Bowel wall thickening of multiple small bowel loops in the left anterior abdomen. 4.4 x 4.8 x 4 cm abscess between the small bowel loops in the left mid abdomen. No pneumatosis,  pneumoperitoneum or portal venous gas. No bowel obstruction. 2.  Aortic Atherosclerosis (ICD10-I70.0). Electronically Signed   By: Kathreen Devoid   On: 01/03/2021 18:33   US Venous Img Lower Bilateral  Result Date: 12/27/2020 CLINICAL DATA:  Recent diagnosis of bilateral pulmonary emboli. EXAM: BILATERAL LOWER EXTREMITY VENOUS DOPPLER ULTRASOUND TECHNIQUE: Gray-scale sonography with compression, as well as color and duplex ultrasound, were performed to evaluate the deep venous system(s) from the level of the common femoral vein through the popliteal and proximal calf veins. COMPARISON:  07/02/2019 FINDINGS: VENOUS On the right, normal compressibility of the common femoral, superficial femoral, and popliteal veins, as well as the visualized calf veins. Visualized portions of profunda femoral vein and great saphenous vein unremarkable. No filling defects to suggest DVT on grayscale or color Doppler imaging. Doppler waveforms show normal direction of venous flow, normal respiratory phasicity and response to augmentation. On the left, normal compressibility of the common femoral, and femoral vein. Saphenofemoral junction patent. There is hypoechoic  noncompressible thrombus in the popliteal vein with a small amount of persistent antegrade signal on Doppler and color Doppler. There is partially occlusive thrombus in the gastric vein, and occlusive thrombus in the posterior tibial vein. OTHER None. Limitations: none IMPRESSION: 1. POSITIVE for left posterior tibial, gastric, and popliteal DVT. 2. Negative for right lower extremity DVT. Electronically Signed   By: Lucrezia Europe M.D.   On: 12/27/2020 13:41   ECHOCARDIOGRAM COMPLETE  Result Date: 12/28/2020    ECHOCARDIOGRAM REPORT   Patient Name:   AYAN HEFFINGTON Date of Exam: 12/28/2020 Medical Rec #:  [id number]        Height:       74.0 in Accession #:    [id number]       Weight:       228.0 lb Date of Birth:  1951/01/12       BSA:          2.298 m Patient Age:    27  years         BP:           114/72 mmHg Patient Gender: M                HR:           59 bpm. Exam Location:  Inpatient Procedure: 2D Echo, Cardiac Doppler and Color Doppler Indications:    I26.02 Pulmonary embolus  History:        Patient has no prior history of Echocardiogram examinations.                 COVID-19 Positive.  Sonographer:    Jonelle Sidle Dance Referring Phys: [id number] Lequita Halt  Sonographer Comments: No subcostal window. IMPRESSIONS  1. Left ventricular ejection fraction, by estimation, is 55 to 60%. The left ventricle has normal function. The left ventricle has no regional wall motion abnormalities. There is mild concentric left ventricular hypertrophy. Left ventricular diastolic parameters are indeterminate.  2. Right ventricular systolic function is normal. The right ventricular size is normal.  3. The mitral valve is normal in structure. Trivial mitral valve regurgitation. No evidence of mitral stenosis.  4. The aortic valve is normal in structure. Aortic valve regurgitation is trivial. No aortic stenosis is present.  5. Aortic dilatation noted. There is mild dilatation of the aortic root, measuring 43 mm. There is borderline dilatation of the ascending aorta, measuring 37 mm. FINDINGS  Left Ventricle: Left ventricular ejection fraction, by estimation, is 55 to 60%. The left ventricle has normal function. The left ventricle has no regional wall motion abnormalities. The left ventricular internal cavity size was normal in size. There is  mild concentric left ventricular hypertrophy. Left ventricular diastolic parameters are indeterminate. Normal left ventricular filling pressure. Right Ventricle: The right ventricular size is normal. No increase in right ventricular wall thickness. Right ventricular systolic function is normal. Left Atrium: Left atrial size was normal in size. Right Atrium: Right atrial size was normal in size. Pericardium: There is no evidence of pericardial effusion. Mitral  Valve: The mitral valve is normal in structure. Trivial mitral valve regurgitation. No evidence of mitral valve stenosis. Tricuspid Valve: The tricuspid valve is normal in structure. Tricuspid valve regurgitation is trivial. No evidence of tricuspid stenosis. Aortic Valve: The aortic valve is normal in structure. Aortic valve regurgitation is trivial. No aortic stenosis is present. Pulmonic Valve: The pulmonic valve was normal in structure. Pulmonic valve regurgitation is trivial. No evidence of pulmonic stenosis. Aorta: Aortic dilatation  noted. There is mild dilatation of the aortic root, measuring 43 mm. There is borderline dilatation of the ascending aorta, measuring 37 mm. Venous: The inferior vena cava was not well visualized. IAS/Shunts: No atrial level shunt detected by color flow Doppler.  LEFT VENTRICLE PLAX 2D LVIDd:         4.40 cm  Diastology LVIDs:         2.70 cm  LV e' medial:    5.98 cm/s LV PW:         1.40 cm  LV E/e' medial:  11.9 LV IVS:        1.30 cm  LV e' lateral:   7.07 cm/s LVOT diam:     2.00 cm  LV E/e' lateral: 10.1 LV SV:         77 LV SV Index:   33 LVOT Area:     3.14 cm  RIGHT VENTRICLE RV Basal diam:  3.20 cm RV Mid diam:    2.10 cm RV S prime:     12.80 cm/s TAPSE (M-mode): 2.0 cm LEFT ATRIUM           Index       RIGHT ATRIUM           Index LA diam:      3.20 cm 1.39 cm/m  RA Area:     21.10 cm LA Vol (A2C): 77.3 ml 33.64 ml/m RA Volume:   63.50 ml  27.63 ml/m LA Vol (A4C): 77.3 ml 33.64 ml/m  AORTIC VALVE LVOT Vmax:   107.00 cm/s LVOT Vmean:  74.600 cm/s LVOT VTI:    0.245 m  AORTA Ao Root diam: 4.30 cm Ao Asc diam:  3.70 cm MITRAL VALVE               TRICUSPID VALVE MV Area (PHT): 4.46 cm    TR Peak grad:   17.5 mmHg MV Decel Time: 170 msec    TR Vmax:        209.00 cm/s MV E velocity: 71.10 cm/s MV A velocity: 52.30 cm/s  SHUNTS MV E/A ratio:  1.36        Systemic VTI:  0.24 m                            Systemic Diam: 2.00 cm Fransico Him MD Electronically signed by  Fransico Him MD Signature Date/Time: 12/28/2020/2:59:59 PM    Final    CT IMAGE GUIDED DRAINAGE BY PERCUTANEOUS CATHETER  Result Date: 01/05/2021 CLINICAL DATA:  History of diverticulitis. 4.8 cm interloop abscess in the left lower quadrant. Drainage requested. EXAM: CT GUIDED DRAINAGE OF PELVIC ABSCESS ANESTHESIA/SEDATION: Intravenous Fentanyl 65mcg and Versed 1mg  were administered as conscious sedation during continuous monitoring of the patient's level of consciousness and physiological / cardiorespiratory status by the radiology RN, with a total moderate sedation time of 17 minutes. PROCEDURE: The procedure, risks, benefits, and alternatives were explained to the patient. Questions regarding the procedure were encouraged and answered. The patient understands and consents to the procedure. select axial scans through the lower abdomen pelvis obtained. the collection was localized and an appropriate skin entry site was determined and marked. The operative field was prepped with chlorhexidinein a sterile fashion, and a sterile drape was applied covering the operative field. A sterile gown and sterile gloves were used for the procedure. Local anesthesia was provided with 1% Lidocaine. Under CT fluoroscopic guidance, 18 gauge percutaneous entry needle advanced  into the collection. Amplatz guidewire advanced easily within the collection, confirmed on CT. Tract dilated to facilitate placement of 12 French pigtail drain catheter, formed centrally. 10 mL of purulent material were aspirated, sent for Gram stain and culture. Catheter was flushed, secured externally with 0 Prolene suture and StatLock, and placed to gravity drain bag. The patient tolerated the procedure well. COMPLICATIONS: None immediate FINDINGS: Complex left lower quadrant peripheral peritoneal fluid collection was again localized. 12 French pigtail drain catheter placed as above. Sample of the purulent aspirate sent for Gram stain and culture.  IMPRESSION: 1. Technically successful CT-guided left lower quadrant abscess drain catheter placement Electronically Signed   By: Lucrezia Europe M.D.   On: 01/05/2021 12:46    Labs:  CBC: Recent Labs    01/06/21 0057 01/07/21 0107 01/08/21 0043 01/10/21 0000  WBC 5.5 5.3 5.3 6.3  HGB 13.1 12.4* 12.9* 14.0  HCT 40.3 38.1* 39.3 43.0  PLT 302 271 286 332    COAGS: Recent Labs    01/04/21 1435 01/04/21 2055 01/05/21 0243 01/06/21 0057 01/07/21 0107 01/08/21 0043  INR 1.4*  --   --   --   --   --   APTT 37*   < > 99* 75* 94* 153*   < > = values in this interval not displayed.    BMP: Recent Labs    03/14/20 0856 05/29/20 0000 12/27/20 1105 01/03/21 0000 01/04/21 0740 01/05/21 0243 01/10/21 0000  NA 141 140   < > 137 137 137 138  K 4.7 4.8   < > 4.5 4.4 4.0 4.6  CL 105 105   < > 100 102 102 100  CO2 25 28   < > 28 23 28 29   GLUCOSE 83 101*   < > 105* 98 98 98  BUN 19 15   < > 15 10 10 18   CALCIUM 9.3 9.4   < > 9.1 9.3 8.6* 9.6  CREATININE 1.00 0.89   < > 0.88 0.89 0.93 0.94  GFRNONAA 78 88   < > 88 >60 >60 83  GFRAA 90 103  --  102  --   --  96   < > = values in this interval not displayed.    LIVER FUNCTION TESTS: Recent Labs    12/27/20 1105 01/03/21 0000 01/04/21 0740 01/10/21 0000  BILITOT 0.5 0.5 0.9 0.4  AST 16 20 26  34  ALT 25 30 38 46  ALKPHOS 51  --  64  --   PROT 6.4* 6.8 7.4 7.0  ALBUMIN 3.0*  --  3.3*  --     TUMOR MARKERS: No results for input(s): AFPTM, CEA, CA199, CHROMGRNA in the last 8760 hours.  Assessment:  70 y/o M who presented to [facility] ED on 01/04/21 after outpatient CT scan noted LLQ abdominal abscess, he subsequently underwent percutaneous drain placement in IR on 6/24 and was discharged to home on 6/27. He is seen today for follow up imaging and drain evaluation.  Patient tolerating PO intake, no significant abdominal pain, no fever. Currently taking PO antibiotics with 1 day left. Drain being flushed QD with 5 cc NS, essentially  no output from drain in 3 days.  CT abd/pelvis with IV contrast today shows resolution of previously seen LLQ abscess.  Drain injection with 20 cc contrast today shows collapsed abscess cavity without obvious fistula.   Above findings reviewed by Dr. Pascal Lux who recommends drain removal -- drain was removed in tact today without complication.  4x4 gauze + medipore tape applied, patient instructed to leave dressing x 24H then can remove and shower, do not submerge x 7 days.  Encouraged patient to complete abx course, maintain all follow up appointments and to contact [facility] if follow appointment has not already been scheduled (per d/c summary their office was to reach out to him about 2 weeks post d/c to schedule).  No further IR follow up needed at this time, please call with questions or concerns.   Electronically Signed: Joaquim Nam PA-C 01/17/2021, 1:03 PM

## 2021-01-24 ENCOUNTER — Encounter: Payer: Self-pay | Admitting: Physician Assistant

## 2021-01-24 ENCOUNTER — Other Ambulatory Visit: Payer: Self-pay | Admitting: Physician Assistant

## 2021-01-24 DIAGNOSIS — I2699 Other pulmonary embolism without acute cor pulmonale: Secondary | ICD-10-CM

## 2021-01-24 DIAGNOSIS — I824Y2 Acute embolism and thrombosis of unspecified deep veins of left proximal lower extremity: Secondary | ICD-10-CM

## 2021-01-24 MED ORDER — RIVAROXABAN 20 MG PO TABS: 20.0000 mg | ORAL_TABLET | Freq: Every day | ORAL | 4 refills | Status: DC

## 2021-01-24 NOTE — Telephone Encounter (Signed)
Patient also left a vm re: cost of Eliquis. He applied for assistance but makes too much money. He is down to his last pill. He wants to know if he could be put on an alternative drug. Please advise?

## 2021-02-05 ENCOUNTER — Other Ambulatory Visit: Payer: Self-pay | Admitting: Family

## 2021-02-05 DIAGNOSIS — E7211 Homocystinuria: Secondary | ICD-10-CM

## 2021-02-05 DIAGNOSIS — I2699 Other pulmonary embolism without acute cor pulmonale: Secondary | ICD-10-CM

## 2021-02-05 DIAGNOSIS — I824Y2 Acute embolism and thrombosis of unspecified deep veins of left proximal lower extremity: Secondary | ICD-10-CM

## 2021-02-06 ENCOUNTER — Other Ambulatory Visit: Payer: Self-pay

## 2021-02-06 ENCOUNTER — Inpatient Hospital Stay (HOSPITAL_BASED_OUTPATIENT_CLINIC_OR_DEPARTMENT_OTHER): Payer: Medicare Other | Admitting: Family

## 2021-02-06 ENCOUNTER — Inpatient Hospital Stay: Payer: Medicare Other | Attending: Hematology & Oncology

## 2021-02-06 ENCOUNTER — Encounter: Payer: Self-pay | Admitting: Family

## 2021-02-06 VITALS — BP 99/68 | HR 65 | Temp 98.6°F | Resp 18 | Wt 212.4 lb

## 2021-02-06 DIAGNOSIS — Z7901 Long term (current) use of anticoagulants: Secondary | ICD-10-CM | POA: Diagnosis not present

## 2021-02-06 DIAGNOSIS — E7211 Homocystinuria: Secondary | ICD-10-CM

## 2021-02-06 DIAGNOSIS — Z808 Family history of malignant neoplasm of other organs or systems: Secondary | ICD-10-CM | POA: Insufficient documentation

## 2021-02-06 DIAGNOSIS — I824Y2 Acute embolism and thrombosis of unspecified deep veins of left proximal lower extremity: Secondary | ICD-10-CM

## 2021-02-06 DIAGNOSIS — E039 Hypothyroidism, unspecified: Secondary | ICD-10-CM | POA: Insufficient documentation

## 2021-02-06 DIAGNOSIS — I82442 Acute embolism and thrombosis of left tibial vein: Secondary | ICD-10-CM

## 2021-02-06 DIAGNOSIS — I82432 Acute embolism and thrombosis of left popliteal vein: Secondary | ICD-10-CM | POA: Insufficient documentation

## 2021-02-06 DIAGNOSIS — Z8616 Personal history of COVID-19: Secondary | ICD-10-CM | POA: Diagnosis not present

## 2021-02-06 DIAGNOSIS — Z79899 Other long term (current) drug therapy: Secondary | ICD-10-CM | POA: Diagnosis not present

## 2021-02-06 DIAGNOSIS — I2699 Other pulmonary embolism without acute cor pulmonale: Secondary | ICD-10-CM | POA: Diagnosis present

## 2021-02-06 LAB — CBC WITH DIFFERENTIAL (CANCER CENTER ONLY)
Abs Immature Granulocytes: 0.01 10*3/uL (ref 0.00–0.07)
Basophils Absolute: 0 10*3/uL (ref 0.0–0.1)
Basophils Relative: 1 %
Eosinophils Absolute: 0.1 10*3/uL (ref 0.0–0.5)
Eosinophils Relative: 1 %
HCT: 40.2 % (ref 39.0–52.0)
Hemoglobin: 13.4 g/dL (ref 13.0–17.0)
Immature Granulocytes: 0 %
Lymphocytes Relative: 31 %
Lymphs Abs: 1.3 10*3/uL (ref 0.7–4.0)
MCH: 29.5 pg (ref 26.0–34.0)
MCHC: 33.3 g/dL (ref 30.0–36.0)
MCV: 88.5 fL (ref 80.0–100.0)
Monocytes Absolute: 0.4 10*3/uL (ref 0.1–1.0)
Monocytes Relative: 11 %
Neutro Abs: 2.3 10*3/uL (ref 1.7–7.7)
Neutrophils Relative %: 56 %
Platelet Count: 210 10*3/uL (ref 150–400)
RBC: 4.54 MIL/uL (ref 4.22–5.81)
RDW: 13.9 % (ref 11.5–15.5)
WBC Count: 4.1 10*3/uL (ref 4.0–10.5)
nRBC: 0 % (ref 0.0–0.2)

## 2021-02-06 LAB — CMP (CANCER CENTER ONLY)
ALT: 17 U/L (ref 0–44)
AST: 16 U/L (ref 15–41)
Albumin: 4 g/dL (ref 3.5–5.0)
Alkaline Phosphatase: 64 U/L (ref 38–126)
Anion gap: 5 (ref 5–15)
BUN: 15 mg/dL (ref 8–23)
CO2: 31 mmol/L (ref 22–32)
Calcium: 9.9 mg/dL (ref 8.9–10.3)
Chloride: 104 mmol/L (ref 98–111)
Creatinine: 0.97 mg/dL (ref 0.61–1.24)
GFR, Estimated: >60 mL/min (ref >60)
Glucose, Bld: 84 mg/dL (ref 70–99)
Potassium: 4.1 mmol/L (ref 3.5–5.1)
Sodium: 140 mmol/L (ref 135–145)
Total Bilirubin: 0.5 mg/dL (ref 0.3–1.2)
Total Protein: 6.7 g/dL (ref 6.5–8.1)

## 2021-02-06 LAB — D-DIMER, QUANTITATIVE: D-Dimer, Quant: 0.44 ug/mL-FEU (ref 0.00–0.50)

## 2021-02-06 LAB — ANTITHROMBIN III: AntiThromb III Func: 106 % (ref 75–120)

## 2021-02-06 NOTE — Progress Notes (Signed)
Hematology/Oncology Consultation   Name: Marc Lowe      MRN: [id number]    Location: Room/bed info not found  Date: 02/06/2021 Time:3:06 PM   REFERRING PHYSICIAN: Iran Planas, PA-C  REASON FOR CONSULT: Bilateral pulmonary emboli and acute DVT of the left lower extremity   DIAGNOSIS: Bilateral pulmonary emboli and acute DVT of the left lower extremity  HISTORY OF PRESENT ILLNESS: Marc Lowe is a very pleasant 70 yo caucasian gentleman with recent diagnosis of bilateral pulmonary emboli and left lower extremity DVT. He was treated with IV Heparin during admission and transitioned to Eliquis at discharge. He then changed to Xarelto 20 mg PO daily due to cost.  He has no prior history of thrombus. No known family history.  He had been diagnosed with Covid 2 weeks before thrombotic event.  He denies testosterone or herbal supplement use.  No smoking or recreational drug use. He has 2-3 glasses of wine per week.  Most recent colonoscopy was in March 2021. He had a polyp noted at the rectum and internal hemorrhoids. Surgical consult was placed due to high risk of perforation and bleed and he had the rectal polyp removed in May 2021. 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 Colonoscopy due again at 3 years.  He had a bout with diverticulitis with small abscess treated with Cipro/Flagyl as well as a drain placed during hospital admission 01/04/2021 - 01/08/2021.  He states that he had a colon resection in 2015 due to E. coli infection and required a temporary ostomy. This was eventually reversed.  No history of diabetes.  He is on Synthroid for hypothyroidism.  No personal history of cancer. His mother had history of skin cancer.  No fever, chills, n/v, cough, rash, dizziness, SOB, chest pain, palpitations, abdominal pain or changes in bowel or bladder habits.  No blood loss noted. No abnormal bruising, no petechiae.  No tenderness, numbness or tingling in his extremities.  He has  mild chronic swelling in the left ankle since having veins stripped in that leg with history of varicose veins. Pedal pulses are 2+.  No falls or syncope to report.  He has maintained a good appetite and is staying well hydrated. His weight is stable at 212 lbs.  He enjoys playing gold and walking for exercise.  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   ROS: All other 10 point review of systems is negative.   PAST MEDICAL HISTORY:   Past Medical History:  Diagnosis Date   BPH (benign prostatic hyperplasia)    Depression    Diverticulitis    Hypothyroidism (acquired)    Pulmonary emboli (HCC)     ALLERGIES: Allergies  Allergen Reactions   Nsaids Other (See Comments)    SHOULD NOT HAVE THIS CLASS OF MEDICATION- HISTORY OF DIVERTICULITIS   Hydromorphone Nausea And Vomiting   Atorvastatin Nausea Only and Other (See Comments)    Abdominal pain and reflux, also   Hydromorphone Hcl Nausea And Vomiting   Rosuvastatin Nausea And Vomiting   Zetia [Ezetimibe] Nausea Only and Other (See Comments)    Caused an upset stomach and made patient's body hurt      MEDICATIONS:  Current Outpatient Medications on File Prior to Visit  Medication Sig Dispense Refill   levothyroxine (EUTHYROX) 25 MCG tablet Take 1 tablet (25 mcg total) by mouth daily before breakfast. 90 tablet 1   lidocaine (XYLOCAINE) 5 % ointment Apply 1 application topically as needed.  35.44 g 0   rivaroxaban (XARELTO) 20 MG TABS tablet Take 1 tablet (20 mg total) by mouth daily with supper. 30 tablet 4   Sildenafil Citrate (VIAGRA PO) Take by mouth.     tamsulosin (FLOMAX) 0.4 MG CAPS capsule Take 0.4 mg by mouth at bedtime.     Vilazodone HCl (VIIBRYD) 20 MG TABS Take 1 tablet (20 mg total) by mouth daily. 90 tablet 3   apixaban (ELIQUIS) 5 MG TABS tablet Take 1 tablet (5 mg total) by mouth 2 (two) times daily. (Patient not taking: Reported on 02/06/2021) 60 tablet 4   APIXABAN (ELIQUIS) VTE  STARTER PACK ('10MG'$  AND '5MG'$ ) Take as directed on package: start with two-'5mg'$  tablets twice daily for 7 days. On day 8, switch to one-'5mg'$  tablet twice daily. (Patient not taking: Reported on 02/06/2021) 74 each 0   cefdinir (OMNICEF) 300 MG capsule Take 1 capsule (300 mg total) by mouth 2 (two) times daily. (Patient not taking: Reported on 02/06/2021) 20 capsule 0   [DISCONTINUED] ezetimibe (ZETIA) 10 MG tablet Take 1 tablet (10 mg total) by mouth daily. (Patient not taking: No sig reported) 90 tablet 3   No current facility-administered medications on file prior to visit.     PAST SURGICAL HISTORY Past Surgical History:  Procedure Laterality Date   COLON RESECTION SIGMOID     2005   history of colostomy  2005   s/p takedown with mesh   IR RADIOLOGIST EVAL & MGMT  01/17/2021    FAMILY HISTORY: Family History  Problem Relation Age of Onset   Atrial fibrillation Father     SOCIAL HISTORY:  reports that he has never smoked. He has never used smokeless tobacco. He reports current alcohol use of about 3.0 standard drinks of alcohol per week. He reports that he does not use drugs.  PERFORMANCE STATUS: The patient's performance status is 1 - Symptomatic but completely ambulatory  PHYSICAL EXAM: Most Recent Vital Signs: There were no vitals taken for this visit. BP 99/68 (BP Location: Left Arm, Patient Position: Sitting)   Pulse 65   Temp 98.6 F (37 C) (Oral)   Resp 18   Wt 212 lb 6.4 oz (96.3 kg)   SpO2 98%   BMI 27.27 kg/m   General Appearance:    Alert, cooperative, no distress, appears stated age  Head:    Normocephalic, without obvious abnormality, atraumatic        Nose:   Nares normal, septum midline, mucosa normal, no drainage    or sinus tenderness  Throat:   Lips, mucosa, and tongue normal; teeth and gums normal  Neck:   Supple, symmetrical, trachea midline, no adenopathy;       thyroid:  No enlargement/tenderness/nodules; no carotid   bruit or JVD  Back:     Symmetric,  no curvature, ROM normal, no CVA tenderness  Lungs:     Clear to auscultation bilaterally, respirations unlabored  Chest wall:    No tenderness or deformity  Heart:    Regular rate and rhythm, S1 and S2 normal, no murmur, rub   or gallop  Abdomen:     Soft, non-tender, bowel sounds active all four quadrants,    no masses, no organomegaly        Extremities:   Extremities normal, atraumatic, no cyanosis or edema  Pulses:   2+ and symmetric all extremities  Skin:   Skin color, texture, turgor normal, no rashes or lesions  Lymph nodes:   Cervical, supraclavicular, and  axillary nodes normal  Neurologic:   CNII-XII intact. Normal strength, sensation and reflexes      throughout    LABORATORY DATA:  Results for orders placed or performed in visit on 02/06/21 (from the past 48 hour(s))  CBC with Differential ([place] Only)     Status: None   Collection Time: 02/06/21  2:47 PM  Result Value Ref Range   WBC Count 4.1 4.0 - 10.5 K/uL   RBC 4.54 4.22 - 5.81 MIL/uL   Hemoglobin 13.4 13.0 - 17.0 g/dL   HCT 40.2 39.0 - 52.0 %   MCV 88.5 80.0 - 100.0 fL   MCH 29.5 26.0 - 34.0 pg   MCHC 33.3 30.0 - 36.0 g/dL   RDW 13.9 11.5 - 15.5 %   Platelet Count 210 150 - 400 K/uL   nRBC 0.0 0.0 - 0.2 %   Neutrophils Relative % 56 %   Neutro Abs 2.3 1.7 - 7.7 K/uL   Lymphocytes Relative 31 %   Lymphs Abs 1.3 0.7 - 4.0 K/uL   Monocytes Relative 11 %   Monocytes Absolute 0.4 0.1 - 1.0 K/uL   Eosinophils Relative 1 %   Eosinophils Absolute 0.1 0.0 - 0.5 K/uL   Basophils Relative 1 %   Basophils Absolute 0.0 0.0 - 0.1 K/uL   Immature Granulocytes 0 %   Abs Immature Granulocytes 0.01 0.00 - 0.07 K/uL    Comment: Performed at [facility] at [facility], [address] [place], [place] [address]      RADIOGRAPHY: No results found.     PATHOLOGY: None  ASSESSMENT/PLAN: Marc Lowe is a very pleasant 70 yo caucasian gentleman with recent diagnosis of bilateral pulmonary  emboli and left lower extremity DVT. He had recently had Covid and then developed diverticulitis with abscess.  D-Dimer is 0.44.  He is doing well on Xarelto and will continue his same regimen.  We did a hyper coag panel today to rule out any underlying clotting issue that may have contributed to his thrombotic event.  We will go ahead and plan to see him again in another 2 months and will repeat his Ct angio and left lower extremity US at that time to assess his response to Xarelto.   All questions were answered. The patient knows to call the clinic with any problems, questions or concerns. We can certainly see the patient much sooner if necessary.  The patient was discussed with Dr. Marin Olp and he is in agreement with the aforementioned.   Laverna Peace, NP

## 2021-02-07 ENCOUNTER — Telehealth: Payer: Self-pay | Admitting: *Deleted

## 2021-02-07 ENCOUNTER — Encounter: Payer: Self-pay | Admitting: Physician Assistant

## 2021-02-07 ENCOUNTER — Ambulatory Visit (INDEPENDENT_AMBULATORY_CARE_PROVIDER_SITE_OTHER): Payer: Medicare Other | Admitting: Physician Assistant

## 2021-02-07 ENCOUNTER — Other Ambulatory Visit: Payer: Self-pay

## 2021-02-07 VITALS — BP 128/69 | HR 72 | Ht 74.0 in | Wt 212.0 lb

## 2021-02-07 DIAGNOSIS — I2699 Other pulmonary embolism without acute cor pulmonale: Secondary | ICD-10-CM | POA: Diagnosis not present

## 2021-02-07 DIAGNOSIS — K5720 Diverticulitis of large intestine with perforation and abscess without bleeding: Secondary | ICD-10-CM | POA: Diagnosis not present

## 2021-02-07 DIAGNOSIS — I824Y2 Acute embolism and thrombosis of unspecified deep veins of left proximal lower extremity: Secondary | ICD-10-CM

## 2021-02-07 DIAGNOSIS — E039 Hypothyroidism, unspecified: Secondary | ICD-10-CM

## 2021-02-07 MED ORDER — LEVOTHYROXINE SODIUM 25 MCG PO TABS: 25.0000 ug | ORAL_TABLET | Freq: Every day | ORAL | 1 refills | Status: DC

## 2021-02-07 NOTE — Telephone Encounter (Signed)
No 02/06/21 los

## 2021-02-07 NOTE — Progress Notes (Signed)
Subjective:    Patient ID: Marc Lowe, male    DOB: 04-Sep-1950, 70 y.o.   MRN: [id number]  HPI Patient is a 70 year old male with recent history of bilateral pulmonary embolism, left DVT, colonic abscess after COVID infection who presents to the clinic for follow-up.  Patient did see hematology yesterday with extensive blood work.  He remains on Xarelto.  He is doing excellent.  His energy is returning.  He is starting to gain weight.  He denies any shortness of breath, leg pain, cough, weakness.  He has started back with lots of his extracurricular activities.  He does need is thyroid medication refilled. Taking daily no problems.   No left lower quadrant pain today. Following up with GI.   .. 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   HYPOGONADISM, MALE 11/04/2006   Depression, recurrent ([place]) 11/04/2006   VARICES OF OTHER SITES 10/04/2009   CHRONIC RHINITIS 06/04/2007   PARONYCHIA, FINGER 07/13/2009   CELLULITIS AND ABSCESS OF OTHER SPECIFIED SITE 05/09/2008   ACNE ROSACEA 10/07/2006   SKIN RASH 07/13/2009   DIARRHEA 03/14/2010   ABDOMINAL PAIN, GENERALIZED 03/14/2010   IMPAIRED FASTING GLUCOSE 07/24/2009   ELEVATED PROSTATE SPECIFIC ANTIGEN 04/10/2009   LIBIDO, DECREASED 10/07/2006   History of bowel resection 04/14/2014   Testosterone deficiency 04/14/2014   Hyperlipidemia 04/15/2014   Cervical nerve root impingement 04/11/2015   Left lower quadrant abdominal pain 05/08/2016   History of diverticulitis 05/08/2016   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   Family history of atrial fibrillation 06/11/2016   Left shoulder pain 11/21/2016   Toenail fungus 12/16/2016   No energy 12/17/2016   Elevated TSH 12/18/2016   Anhedonia 05/13/2017   Abnormal weight gain 05/13/2017   Cervical radiculopathy 02/18/2020   DDD (degenerative disc disease), cervical 02/18/2020   Mixed hyperlipidemia 02/18/2020   Memory changes  02/18/2020   Seborrheic keratoses 02/21/2020   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   Need for zoster vaccination 05/29/2020   Acute respiratory failure with hypoxia ([place]) 12/27/2020   Intercostal pain 12/27/2020   Mid back pain on right side 12/27/2020   SOB (shortness of breath) 12/27/2020   COVID-19 virus infection 12/27/2020   Pulmonary embolus ([place]) 12/27/2020   Bilateral pulmonary embolism ([place]) 12/27/2020   Pulmonary embolism ([place]) 12/28/2020   Acute deep vein thrombosis (DVT) of proximal vein of left lower extremity ([place]) 01/03/2021   History of COVID-19 01/03/2021   Colonic diverticular abscess 01/04/2021   Hypothyroidism 01/04/2021   Resolved Ambulatory Problems    Diagnosis Date Noted   HYPERLIPIDEMIA 10/07/2006   VIRAL URI 05/09/2008   Localized swelling of left lower extremity 07/02/2019   Past Medical History:  Diagnosis Date   BPH (benign prostatic hyperplasia)    Depression    Diverticulitis    Hypothyroidism (acquired)    Pulmonary emboli ([place])       Review of Systems  All other systems reviewed and are negative.     Objective:   Physical Exam Vitals reviewed.  Constitutional:      Appearance: Normal appearance.  HENT:     Head: Normocephalic.  Cardiovascular:     Rate and Rhythm: Normal rate and regular rhythm.     Pulses: Normal pulses.     Heart sounds: Normal heart sounds. No murmur heard. Pulmonary:     Effort: Pulmonary effort is normal.     Breath sounds: Normal breath sounds.  Abdominal:  General: Bowel sounds are normal. There is no distension.     Palpations: Abdomen is soft. There is no mass.     Tenderness: There is no abdominal tenderness. There is no right CVA tenderness, left CVA tenderness, guarding or rebound.  Musculoskeletal:     Right lower leg: No edema.     Left lower leg: No edema.     Comments: No calf tenderness, redness, warmth, or swelling.   Neurological:     General: No focal deficit present.      Mental Status: He is alert and oriented to person, place, and time.  Psychiatric:        Mood and Affect: Mood normal.          Assessment & Plan:  Marc Lowe KitchenMarland KitchenDraelyn was seen today for follow-up.  Diagnoses and all orders for this visit:  Hypothyroidism, unspecified type -     levothyroxine (EUTHYROX) 25 MCG tablet; Take 1 tablet (25 mcg total) by mouth daily before breakfast.  Bilateral pulmonary embolism (HCC)  Acute deep vein thrombosis (DVT) of proximal vein of left lower extremity ([place])  Colonic diverticular abscess  Patient has had recent extensive labs.  We do not have results for those yet.  He is being followed by hematology after his bilateral pulmonary embolism likely due to COVID infection.  He will remain on Xarelto for likely 6 months.  TSH was last done in February and has been stable.  Sent refill for 6 months.  Continue to follow up with gastroenterology on colonic diverticular abscess.  Certainly if have any acute pain he cannot get in with GI follow-up in office.  Pt is seemly making a full recovery. Follow up in 6 months or sooner if needed.

## 2021-02-08 LAB — LUPUS ANTICOAGULANT PANEL
DRVVT: 59.6 s — ABNORMAL HIGH (ref 0.0–47.0)
PTT Lupus Anticoagulant: 44.6 s (ref 0.0–51.9)

## 2021-02-08 LAB — DRVVT MIX: dRVVT Mix: 52.1 s — ABNORMAL HIGH (ref 0.0–40.4)

## 2021-02-08 LAB — PROTEIN S, TOTAL: Protein S Ag, Total: 98 % (ref 60–150)

## 2021-02-08 LAB — PROTEIN S ACTIVITY: Protein S Activity: 98 % (ref 63–140)

## 2021-02-08 LAB — DRVVT CONFIRM: dRVVT Confirm: 1.3 ratio — ABNORMAL HIGH (ref 0.8–1.2)

## 2021-02-08 LAB — PROTEIN C, TOTAL: Protein C, Total: 100 % (ref 60–150)

## 2021-02-08 LAB — HOMOCYSTEINE: Homocysteine: 12.8 umol/L (ref 0.0–17.2)

## 2021-02-08 LAB — PROTEIN C ACTIVITY: Protein C Activity: 113 % (ref 73–180)

## 2021-02-09 ENCOUNTER — Encounter: Payer: Self-pay | Admitting: Physician Assistant

## 2021-02-09 LAB — BETA-2-GLYCOPROTEIN I ABS, IGG/M/A
Beta-2 Glyco I IgG: <9 GPI IgG units (ref 0–20)
Beta-2-Glycoprotein I IgA: <9 GPI IgA units (ref 0–25)
Beta-2-Glycoprotein I IgM: <9 GPI IgM units (ref 0–32)

## 2021-02-09 LAB — CARDIOLIPIN ANTIBODIES, IGG, IGM, IGA
Anticardiolipin IgA: <9 U/mL (ref 0–11)
Anticardiolipin IgG: 9 GPL U/mL (ref 0–14)
Anticardiolipin IgM: 15 [MPL'U]/mL — ABNORMAL HIGH (ref 0–12)

## 2021-02-14 LAB — PROTHROMBIN GENE MUTATION

## 2021-02-14 LAB — FACTOR 5 LEIDEN

## 2021-02-16 ENCOUNTER — Other Ambulatory Visit: Payer: Self-pay | Admitting: Family

## 2021-02-16 DIAGNOSIS — I824Y2 Acute embolism and thrombosis of unspecified deep veins of left proximal lower extremity: Secondary | ICD-10-CM

## 2021-02-16 DIAGNOSIS — R760 Raised antibody titer: Secondary | ICD-10-CM

## 2021-02-16 DIAGNOSIS — I2699 Other pulmonary embolism without acute cor pulmonale: Secondary | ICD-10-CM

## 2021-02-21 ENCOUNTER — Encounter: Payer: Self-pay | Admitting: Physician Assistant

## 2021-02-22 NOTE — Telephone Encounter (Signed)
I'm wondering if he is in the donut hole or has a deductible to meet. Usually the pharmacy can determine this - sometimes this might mean that it is $480 as a one-time to meet the deductible, but then $37-40 per fill thereafter. When I investigated this last time, the pharmacist told me that his insurance preferred Xarelto. So this is either a deductible to meet, or donut hole. I would 1) have patient ask the pharmacy if that is the case (and, if he is interested/able to do the one-time deductible but then lower cost thereafter), OR 2) if he is not wanting above option, begin warfarin '5mg'$  daily and INR in 1 week.

## 2021-02-25 ENCOUNTER — Encounter: Payer: Self-pay | Admitting: Physician Assistant

## 2021-03-07 ENCOUNTER — Encounter: Payer: Self-pay | Admitting: Physician Assistant

## 2021-03-07 ENCOUNTER — Telehealth (INDEPENDENT_AMBULATORY_CARE_PROVIDER_SITE_OTHER): Payer: Medicare Other | Admitting: Physician Assistant

## 2021-03-07 VITALS — Ht 74.0 in

## 2021-03-07 DIAGNOSIS — F339 Major depressive disorder, recurrent, unspecified: Secondary | ICD-10-CM | POA: Diagnosis not present

## 2021-03-07 MED ORDER — VILAZODONE HCL 40 MG PO TABS: 40.0000 mg | ORAL_TABLET | Freq: Every day | ORAL | 3 refills | Status: DC

## 2021-03-07 NOTE — Progress Notes (Signed)
..Virtual Visit via Video Note  I connected with Marc Lowe on 03/07/21 at  9:10 AM EDT by a video enabled telemedicine application and verified that I am speaking with the correct person using two identifiers.  Location: Patient: home Provider: clinic  .[name] KitchenParticipating in visit:  Patient: Marc Lowe Provider: Iran Planas PA-C   I discussed the limitations of evaluation and management by telemedicine and the availability of in person appointments. The patient expressed understanding and agreed to proceed.  History of Present Illness: Pt is a 70 yo male with recurrent depression in remission who calls in to discuss medication prices. Viibryd is just too expensive and he needs others options. He loves how it has treated his depression and concerned about changing.   .. 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   SKIN RASH 07/13/2009   DIARRHEA 03/14/2010   ABDOMINAL PAIN, GENERALIZED 03/14/2010   IMPAIRED FASTING GLUCOSE 07/24/2009   ELEVATED PROSTATE SPECIFIC ANTIGEN 04/10/2009   LIBIDO, DECREASED 10/07/2006   History of bowel resection 04/14/2014   Testosterone deficiency 04/14/2014   Hyperlipidemia 04/15/2014   Cervical nerve root impingement 04/11/2015   Left lower quadrant abdominal pain 05/08/2016   History of diverticulitis 05/08/2016   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   Family history of atrial fibrillation 06/11/2016   Left shoulder pain 11/21/2016   Toenail fungus 12/16/2016   No energy 12/17/2016   Elevated TSH 12/18/2016   Anhedonia 05/13/2017   Abnormal weight gain 05/13/2017   Cervical radiculopathy 02/18/2020   DDD (degenerative disc disease), cervical  02/18/2020   Mixed hyperlipidemia 02/18/2020   Memory changes 02/18/2020   Seborrheic keratoses 02/21/2020   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   Need for zoster vaccination 05/29/2020   Acute respiratory failure with hypoxia ([place]) 12/27/2020   Intercostal pain 12/27/2020   Mid back pain on right side 12/27/2020   SOB (shortness of breath) 12/27/2020   COVID-19 virus infection 12/27/2020   Pulmonary embolus ([place]) 12/27/2020   Bilateral pulmonary embolism ([place]) 12/27/2020   Pulmonary embolism ([place]) 12/28/2020   Acute deep vein thrombosis (DVT) of proximal vein of left lower extremity ([place]) 01/03/2021   History of COVID-19 01/03/2021   Colonic diverticular abscess 01/04/2021   Hypothyroidism 01/04/2021   Resolved Ambulatory Problems    Diagnosis Date Noted   HYPERLIPIDEMIA 10/07/2006   VIRAL URI 05/09/2008   Localized swelling of left lower extremity 07/02/2019   Past Medical History:  Diagnosis Date   BPH (benign prostatic hyperplasia)    Depression    Diverticulitis    Hypothyroidism (acquired)    Pulmonary emboli (HCC)        Observations/Objective: No acute distress Normal mood and appearance  .[name] Kitchen Depression screen [facility] 2/9 03/07/2021 05/29/2020 05/25/2019 05/06/2018 05/13/2017  Decreased Interest 0 0 0 1 3  Down, Depressed, Hopeless 0 0 0 0 0  PHQ - 2 Score 0 0 0 1 3  Altered sleeping 0 - - 0 0  Tired, decreased energy 0 - - 0 0  Change in appetite 0 - - 0 0  Feeling bad or failure about yourself  0 - - 0 0  Trouble concentrating 0 - - 0 0  Moving slowly or fidgety/restless  0 - - 0 0  Suicidal thoughts 0 - - 0 0  PHQ-9 Score 0 - - 1 3  Difficult doing work/chores Not difficult at all - - Not difficult at all Not difficult at all       Assessment and Plan: [name] KitchenMarland KitchenRahil was seen today for medication problem.  Diagnoses and all orders for this visit:  Depression, recurrent ([place]) -     Vilazodone HCl (VIIBRYD) 40 MG TABS; Take 1 tablet (40 mg  total) by mouth daily.   Good rx at [facility] makes it about 32-35 dollars a month. Sent '40mg'$  for patient to cut in half for the '20mg'$  dose. Pt ok with this.   Follow Up Instructions:    I discussed the assessment and treatment plan with the patient. The patient was provided an opportunity to ask questions and all were answered. The patient agreed with the plan and demonstrated an understanding of the instructions.   The patient was advised to call back or seek an in-person evaluation if the symptoms worsen or if the condition fails to improve as anticipated.    Iran Planas, PA-C

## 2021-03-07 NOTE — Progress Notes (Signed)
Patient would like to discuss his medication. Did not provide any vitals. He would not answer any further questions

## 2021-04-11 ENCOUNTER — Inpatient Hospital Stay: Payer: Medicare Other | Attending: Hematology & Oncology

## 2021-04-11 ENCOUNTER — Other Ambulatory Visit: Payer: Self-pay

## 2021-04-11 ENCOUNTER — Encounter (HOSPITAL_BASED_OUTPATIENT_CLINIC_OR_DEPARTMENT_OTHER): Payer: Self-pay

## 2021-04-11 ENCOUNTER — Ambulatory Visit (HOSPITAL_BASED_OUTPATIENT_CLINIC_OR_DEPARTMENT_OTHER)
Admission: RE | Admit: 2021-04-11 | Discharge: 2021-04-11 | Disposition: A | Discharge status: Home / Self Care | Payer: Medicare Other | Source: Ambulatory Visit | Attending: Family | Admitting: Family

## 2021-04-11 DIAGNOSIS — I824Y2 Acute embolism and thrombosis of unspecified deep veins of left proximal lower extremity: Secondary | ICD-10-CM

## 2021-04-11 DIAGNOSIS — I2699 Other pulmonary embolism without acute cor pulmonale: Secondary | ICD-10-CM

## 2021-04-11 DIAGNOSIS — I82402 Acute embolism and thrombosis of unspecified deep veins of left lower extremity: Secondary | ICD-10-CM | POA: Insufficient documentation

## 2021-04-11 LAB — CBC WITH DIFFERENTIAL (CANCER CENTER ONLY)
Abs Immature Granulocytes: 0.01 10*3/uL (ref 0.00–0.07)
Basophils Absolute: 0 10*3/uL (ref 0.0–0.1)
Basophils Relative: 1 %
Eosinophils Absolute: 0.1 10*3/uL (ref 0.0–0.5)
Eosinophils Relative: 2 %
HCT: 46.1 % (ref 39.0–52.0)
Hemoglobin: 15.7 g/dL (ref 13.0–17.0)
Immature Granulocytes: 0 %
Lymphocytes Relative: 36 %
Lymphs Abs: 1.4 10*3/uL (ref 0.7–4.0)
MCH: 29.5 pg (ref 26.0–34.0)
MCHC: 34.1 g/dL (ref 30.0–36.0)
MCV: 86.7 fL (ref 80.0–100.0)
Monocytes Absolute: 0.5 10*3/uL (ref 0.1–1.0)
Monocytes Relative: 12 %
Neutro Abs: 2 10*3/uL (ref 1.7–7.7)
Neutrophils Relative %: 49 %
Platelet Count: 206 10*3/uL (ref 150–400)
RBC: 5.32 MIL/uL (ref 4.22–5.81)
RDW: 13.2 % (ref 11.5–15.5)
WBC Count: 4 10*3/uL (ref 4.0–10.5)
nRBC: 0 % (ref 0.0–0.2)

## 2021-04-11 LAB — CMP (CANCER CENTER ONLY)
ALT: 16 U/L (ref 0–44)
AST: 17 U/L (ref 15–41)
Albumin: 4.2 g/dL (ref 3.5–5.0)
Alkaline Phosphatase: 55 U/L (ref 38–126)
Anion gap: 7 (ref 5–15)
BUN: 22 mg/dL (ref 8–23)
CO2: 29 mmol/L (ref 22–32)
Calcium: 9.5 mg/dL (ref 8.9–10.3)
Chloride: 102 mmol/L (ref 98–111)
Creatinine: 0.95 mg/dL (ref 0.61–1.24)
GFR, Estimated: >60 mL/min (ref >60)
Glucose, Bld: 93 mg/dL (ref 70–99)
Potassium: 4.9 mmol/L (ref 3.5–5.1)
Sodium: 138 mmol/L (ref 135–145)
Total Bilirubin: 0.7 mg/dL (ref 0.3–1.2)
Total Protein: 6.7 g/dL (ref 6.5–8.1)

## 2021-04-11 MED ORDER — IOHEXOL 350 MG/ML SOLN
100.0000 mL | Freq: Once | INTRAVENOUS | Status: AC | PRN
  Administered 2021-04-11: 100 mL via INTRAVENOUS

## 2021-04-13 ENCOUNTER — Telehealth: Payer: Self-pay | Admitting: Family

## 2021-04-13 NOTE — Telephone Encounter (Signed)
I was able to call and go over CT angio and [place] results with the patient. He has had a nice response with complete resolution or pulmonary emboli and improvement in left lower extremity DVT.  He will continue his same regimen with Xarelto and we will plan to see him for follow-up in another 3 months.  No questions or concerns at this time. Patient appreciative of call.

## 2021-05-14 ENCOUNTER — Telehealth: Payer: Self-pay | Admitting: Neurology

## 2021-05-14 DIAGNOSIS — E039 Hypothyroidism, unspecified: Secondary | ICD-10-CM

## 2021-05-14 NOTE — Telephone Encounter (Signed)
Received note from [facility] that they need to change manufacturers of patient's Levothyroxine. Patient needs TSH in 6 weeks.

## 2021-05-15 NOTE — Telephone Encounter (Signed)
Patient made aware. Lab ordered for 6 weeks.

## 2021-07-23 ENCOUNTER — Telehealth: Payer: Self-pay | Admitting: Physician Assistant

## 2021-07-23 NOTE — Chronic Care Management (AMB) (Signed)
°  Care Management   Note  07/23/2021 Name: DREYTON ROESSNER MRN: [id number] DOB: 20-Jun-1951  MIKAELE STECHER is a 71 y.o. year old male who is a primary care patient of Lavada Mesi and is actively engaged with the care management team. I reached out to [name] by phone today to assist with re-scheduling a follow up visit with the Pharmacist  Follow up plan: Telephone appointment with care management team member scheduled for: 08/09/2021  Julian Hy, [place], [place] [facility]  Direct Dial: [phone]

## 2021-07-23 NOTE — Telephone Encounter (Signed)
Patient's wife called and stated they have a family emergency and need to CANCEL/RESCHEDULE patient's phone visit for tomorrow morning with Larinda Buttery. They said if they could schedule atleast a week out would be great. Please Advise.

## 2021-07-24 ENCOUNTER — Telehealth: Payer: No Typology Code available for payment source

## 2021-08-03 DIAGNOSIS — E039 Hypothyroidism, unspecified: Secondary | ICD-10-CM | POA: Diagnosis not present

## 2021-08-03 LAB — TSH: TSH: 3.61 mIU/L (ref 0.40–4.50)

## 2021-08-06 ENCOUNTER — Other Ambulatory Visit: Payer: Self-pay | Admitting: Physician Assistant

## 2021-08-06 DIAGNOSIS — E039 Hypothyroidism, unspecified: Secondary | ICD-10-CM

## 2021-08-06 MED ORDER — LEVOTHYROXINE SODIUM 25 MCG PO TABS: 25.0000 ug | ORAL_TABLET | Freq: Every day | ORAL | 3 refills | Status: DC

## 2021-08-06 NOTE — Progress Notes (Signed)
Normal thyroid range. Sent refills for 1 year.

## 2021-08-09 ENCOUNTER — Telehealth: Payer: No Typology Code available for payment source

## 2021-08-10 ENCOUNTER — Ambulatory Visit: Payer: Medicare Other | Admitting: Physician Assistant

## 2021-08-13 ENCOUNTER — Ambulatory Visit (INDEPENDENT_AMBULATORY_CARE_PROVIDER_SITE_OTHER): Payer: No Typology Code available for payment source | Admitting: Physician Assistant

## 2021-08-13 ENCOUNTER — Inpatient Hospital Stay: Payer: No Typology Code available for payment source

## 2021-08-13 ENCOUNTER — Inpatient Hospital Stay: Payer: No Typology Code available for payment source | Admitting: Family

## 2021-08-13 DIAGNOSIS — Z0000 Encounter for general adult medical examination without abnormal findings: Secondary | ICD-10-CM | POA: Diagnosis not present

## 2021-08-13 NOTE — Progress Notes (Signed)
MEDICARE ANNUAL WELLNESS VISIT  08/13/2021  Telephone Visit Disclaimer This Medicare AWV was conducted by telephone due to national recommendations for restrictions regarding the COVID-19 Pandemic (e.g. social distancing).  I verified, using two identifiers, that I am speaking with Marc Lowe or their authorized healthcare agent. I discussed the limitations, risks, security, and privacy concerns of performing an evaluation and management service by telephone and the potential availability of an in-person appointment in the future. The patient expressed understanding and agreed to proceed.  Location of Patient: Home Location of Provider (nurse):  In the office.  Subjective:    Marc Lowe is a 71 y.o. male patient of Marc Lowe, Marc Car, PA-C who had a Medicare Annual Wellness Visit today via telephone. Marc Lowe is Retired and lives with their spouse. he has 4 children. he reports that he is socially active and does interact with friends/family regularly. he is moderately physically active and enjoys playing golf.  Patient Care Team: Marc Lowe as PCP - General (Family Medicine) Marc Lowe, [facility] as Pharmacist (Pharmacist)  Advanced Directives 08/13/2021 02/06/2021 12/27/2020 12/27/2020 12/27/2020 12/27/2020 05/29/2020  Does Patient Have a Medical Advance Directive? Yes No - - Yes No Yes  Type of Advance Directive Living will - [place];Living will  Does patient want to make changes to medical advance directive? No - Patient declined - No - Patient declined No - Patient declined - - No - Patient declined  Copy of [place] in Chart? - - No - copy requested - - - No - copy requested  Would patient like information on creating a medical advance directive? - No - Patient declined No - Patient declined No - Patient declined No - Patient declined - -    Hospital Utilization Over the Past 12 Months: #  of hospitalizations or ER visits: 3 # of surgeries: 0  Review of Systems    Patient reports that his overall health is unchanged compared to last year.  History obtained from chart review and the patient  Patient Reported Readings (BP, Pulse, CBG, Weight, etc) none  Pain Assessment Pain : No/denies pain     Current Medications & Allergies (verified) Allergies as of 08/13/2021       Reactions   Nsaids Other (See Comments)   SHOULD NOT HAVE THIS CLASS OF MEDICATION- HISTORY OF DIVERTICULITIS   Hydromorphone Nausea And Vomiting   Atorvastatin Nausea Only, Other (See Comments)   Abdominal pain and reflux, also   Hydromorphone Hcl Nausea And Vomiting   Rosuvastatin Nausea And Vomiting   Zetia [ezetimibe] Nausea Only, Other (See Comments)   Caused an upset stomach and made patient's body hurt        Medication List        Accurate as of August 13, 2021  2:57 PM. If you have any questions, ask your nurse or doctor.          levothyroxine 25 MCG tablet Commonly known as: Euthyrox Take 1 tablet (25 mcg total) by mouth daily before breakfast.   lidocaine 5 % ointment Commonly known as: XYLOCAINE Apply 1 application topically as needed.   rivaroxaban 20 MG Tabs tablet Commonly known as: XARELTO Take 1 tablet (20 mg total) by mouth daily with supper.   tamsulosin 0.4 MG Caps capsule Commonly known as: FLOMAX Take 0.4 mg by mouth at bedtime.   VIAGRA PO Take by mouth.   Viibryd  20 MG Tabs Generic drug: Vilazodone HCl Take 1 tablet (20 mg total) by mouth daily.   Vilazodone HCl 40 MG Tabs Commonly known as: Viibryd Take 1 tablet (40 mg total) by mouth daily.        History (reviewed): Past Medical History:  Diagnosis Date   BPH (benign prostatic hyperplasia)    Depression    Diverticulitis    Hypothyroidism (acquired)    Pulmonary emboli (HCC)    Past Surgical History:  Procedure Laterality Date   COLON RESECTION SIGMOID     2005   history of  colostomy  2005   s/p takedown with mesh   IR RADIOLOGIST EVAL & MGMT  01/17/2021   Family History  Problem Relation Age of Onset   Atrial fibrillation Father    Social History   Socioeconomic History   Marital status: Married    Spouse name: Marc Lowe   Number of children: 4   Years of education: 14   Highest education level: Bachelor's degree (e.g., BA, AB, BS)  Occupational History   Occupation: Chief Strategy Officer    Comment: retired  Tobacco Use   Smoking status: Never   Smokeless tobacco: Never  Vaping Use   Vaping Use: Never used  Substance and Sexual Activity   Alcohol use: Yes    Alcohol/week: 7.0 standard drinks    Types: 7 Glasses of wine per week    Comment: 5 oz   Drug use: No   Sexual activity: Yes    Partners: Female  Other Topics Concern   Not on file  Social History Narrative   Lives with his wife. They have four children. Plays golf once a week. 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    Social Determinants of Health   Financial Resource Strain: Low Risk    Difficulty of Paying Living Expenses: Not hard at all  Food Insecurity: No Food Insecurity   Worried About Charity fundraiser in the Last Year: Never true   [place] in the Last Year: Never true  Transportation Needs: No Transportation Needs   Lack of Transportation (Medical): No   Lack of Transportation (Non-Medical): No  Physical Activity: Sufficiently Active   Days of Exercise per Week: 2 days   Minutes of Exercise per Session: 150+ min  Stress: No Stress Concern Present   Feeling of Stress : Not at all  Social Connections: Moderately Isolated   Frequency of Communication with Friends and Family: More than three times a week   Frequency of Social Gatherings with Friends and Family: Three times a week   Attends Religious Services: Never   Active Member of Clubs or Organizations: No   Attends Archivist Meetings: Never   Marital Status: Married    Activities of Daily  Living In your present state of health, do you have any difficulty performing the following activities: 08/13/2021 12/27/2020  Hearing? N N  Vision? N N  Difficulty concentrating or making decisions? N N  Walking or climbing stairs? N N  Dressing or bathing? N N  Doing errands, shopping? N N  Preparing Food and eating ? N -  Using the Toilet? N -  In the past six months, have you accidently leaked urine? N -  Do you have problems with loss of bowel control? N -  Managing your Medications? N -  Managing your Finances? N -  Housekeeping or managing your Housekeeping? N -  Some recent data might be hidden  Patient Education/ Literacy How often do you need to have someone help you when you read instructions, pamphlets, or other written materials from your doctor or pharmacy?: 1 - Never What is the last grade level you completed in school?: Associates degree  Exercise Current Exercise Habits: Structured exercise class, Type of exercise: Other - see comments (plays golf), Time (Minutes): > 60, Frequency (Times/Week): 2, Weekly Exercise (Minutes/Week): 0, Intensity: Moderate, Exercise limited by: None identified  Diet Patient reports consuming  2-3  meals a day and 0 snack(s) a day Patient reports that his primary diet is: Regular Patient reports that she does have regular access to food.   Depression Screen PHQ 2/9 Scores 08/13/2021 03/07/2021 05/29/2020 05/25/2019 05/06/2018 05/13/2017 12/16/2016  PHQ - 2 Score 0 0 0 0 1 3 6   PHQ- 9 Score - 0 - - 1 3 12   Exception Documentation - - - Medical reason - - -     Fall Risk Fall Risk  08/13/2021 05/29/2020 05/25/2019  Falls in the past year? 0 0 0  Number falls in past yr: 0 0 0  Injury with Fall? 0 0 0  Risk for fall due to : No Fall Risks No Fall Risks -  Follow up Falls evaluation completed;Education provided Falls prevention discussed Falls prevention discussed     Objective:  Marc Lowe seemed alert and oriented and he  participated appropriately during our telephone visit.  Blood Pressure Weight BMI  BP Readings from Last 3 Encounters:  02/07/21 128/69  02/06/21 99/68  01/17/21 117/64   Wt Readings from Last 3 Encounters:  02/07/21 212 lb (96.2 kg)  02/06/21 212 lb 6.4 oz (96.3 kg)  01/10/21 209 lb (94.8 kg)   BMI Readings from Last 1 Encounters:  03/07/21 27.22 kg/m    *Unable to obtain current vital signs, weight, and BMI due to telephone visit type  Hearing/Vision  Isaiah did not seem to have difficulty with hearing/understanding during the telephone conversation Reports that he has had a formal eye exam by an eye care professional within the past year Reports that he has not had a formal hearing evaluation within the past year *Unable to fully assess hearing and vision during telephone visit type  Cognitive Function: 6CIT Screen 08/13/2021 05/29/2020 05/25/2019  What Year? 0 points 0 points 0 points  What month? 0 points 0 points 0 points  What time? 0 points 0 points 0 points  Count back from 20 0 points 0 points 0 points  Months in reverse 0 points 0 points 0 points  Repeat phrase 0 points 0 points 0 points  Total Score 0 0 0   (Normal:0-7, Significant for Dysfunction: >8)  Normal Cognitive Function Screening: Yes   Immunization & Health Maintenance Record Immunization History  Administered Date(s) Administered   PFIZER(Purple Top)SARS-COV-2 Vaccination 09/30/2019, 10/21/2019   Pneumococcal Polysaccharide-23 05/06/2018   Tdap 04/14/2014   Zoster, Live 04/14/2014    Health Maintenance  Topic Date Due   COVID-19 Vaccine (3 - Pfizer risk series) 08/29/2021 (Originally 11/18/2019)   INFLUENZA VACCINE  10/12/2021 (Originally 02/12/2021)   Zoster Vaccines- Shingrix (1 of 2) 11/11/2021 (Originally 06/30/1970)   Pneumonia Vaccine 70+ Years old (2 - PCV) 08/13/2022 (Originally 05/07/2019)   TETANUS/TDAP  04/14/2024   COLONOSCOPY (Pts 45-36yrs Insurance coverage will need to be  confirmed)  10/06/2029   Hepatitis C Screening  Completed   HPV VACCINES  Aged Out       Assessment  This is a routine wellness  examination for Marc Lowe.  Health Maintenance: Due or Overdue There are no preventive care reminders to display for this patient.   Marc Lowe does not need a referral for Community Assistance: Care Management:   no Social Work:    no Prescription Assistance:  no Nutrition/Diabetes Education:  no   Plan:  Personalized Goals  Goals Addressed               This Visit's Progress     Patient Stated (pt-stated)        Loose 10 lbs.       Personalized Health Maintenance & Screening Recommendations  Pneumococcal vaccine  Shingrix   Lung Cancer Screening Recommended: no (Low Dose CT Chest recommended if Age 32-80 years, 30 pack-year currently smoking OR have quit w/in past 15 years) Hepatitis C Screening recommended: no HIV Screening recommended: no  Advanced Directives: Written information was not prepared per patient's request.  Referrals & Orders No orders of the defined types were placed in this encounter.   Follow-up Plan Follow-up with Donella Stade, PA-C as planned Schedule your shingrix vaccine at the pharmacy.  Pneumonia vaccine can be done in the office.  Medicare wellness visit in one year. Patient will AVS on my chart.   I have personally reviewed and noted the following in the patients chart:   Medical and social history Use of alcohol, tobacco or illicit drugs  Current medications and supplements Functional ability and status Nutritional status Physical activity Advanced directives List of other physicians Hospitalizations, surgeries, and ER visits in previous 12 months Vitals Screenings to include cognitive, depression, and falls Referrals and appointments  In addition, I have reviewed and discussed with Marc Lowe certain preventive protocols, quality metrics, and best practice  recommendations. A written personalized care plan for preventive services as well as general preventive health recommendations is available and can be mailed to the patient at his request.      Tinnie Gens, RN  08/13/2021

## 2021-08-13 NOTE — Patient Instructions (Addendum)
[place] Maintenance Summary and Written Plan of Care  Marc Lowe ,  Thank you for allowing me to perform your Medicare Annual Wellness Visit and for your ongoing commitment to your health.   Health Maintenance & Immunization History Health Maintenance  Topic Date Due   COVID-19 Vaccine (3 - Pfizer risk series) 08/29/2021 (Originally 11/18/2019)   INFLUENZA VACCINE  10/12/2021 (Originally 02/12/2021)   Zoster Vaccines- Shingrix (1 of 2) 11/11/2021 (Originally 06/30/1970)   Pneumonia Vaccine 2+ Years old (2 - PCV) 08/13/2022 (Originally 05/07/2019)   TETANUS/TDAP  04/14/2024   COLONOSCOPY (Pts 45-66yrs Insurance coverage will need to be confirmed)  10/06/2029   Hepatitis C Screening  Completed   HPV VACCINES  Aged Out   Immunization History  Administered Date(s) Administered   PFIZER(Purple Top)SARS-COV-2 Vaccination 09/30/2019, 10/21/2019   Pneumococcal Polysaccharide-23 05/06/2018   Tdap 04/14/2014   Zoster, Live 04/14/2014    These are the patient goals that we discussed:  Goals Addressed               This Visit's Progress     Patient Stated (pt-stated)        Loose 10 lbs.         This is a list of Health Maintenance Items that are overdue or due now: Pneumococcal vaccine  Shingrix   Orders/Referrals Placed Today: No orders of the defined types were placed in this encounter.  (Contact our referral department at [phone] if you have not spoken with someone about your referral appointment within the next 5 days)    Follow-up Plan Follow-up with Donella Stade, PA-C as planned Schedule your shingrix vaccine at the pharmacy.  Pneumonia vaccine can be done in the office.  Medicare wellness visit in one year. Patient will AVS on my chart.      Health Maintenance, Male Adopting a healthy lifestyle and getting preventive care are important in promoting health and wellness. Ask your health care provider about: The right  schedule for you to have regular tests and exams. Things you can do on your own to prevent diseases and keep yourself healthy. What should I know about diet, weight, and exercise? Eat a healthy diet  Eat a diet that includes plenty of vegetables, fruits, low-fat dairy products, and lean protein. Do not eat a lot of foods that are high in solid fats, added sugars, or sodium. Maintain a healthy weight Body mass index (BMI) is a measurement that can be used to identify possible weight problems. It estimates body fat based on height and weight. Your health care provider can help determine your BMI and help you achieve or maintain a healthy weight. Get regular exercise Get regular exercise. This is one of the most important things you can do for your health. Most adults should: Exercise for at least 150 minutes each week. The exercise should increase your heart rate and make you sweat (moderate-intensity exercise). Do strengthening exercises at least twice a week. This is in addition to the moderate-intensity exercise. Spend less time sitting. Even light physical activity can be beneficial. Watch cholesterol and blood lipids Have your blood tested for lipids and cholesterol at 70 years of age, then have this test every 5 years. You may need to have your cholesterol levels checked more often if: Your lipid or cholesterol levels are high. You are older than 70 years of age. You are at high risk for heart disease. What should I know about cancer screening? Many types of  cancers can be detected early and may often be prevented. Depending on your health history and family history, you may need to have cancer screening at various ages. This may include screening for: Colorectal cancer. Prostate cancer. Skin cancer. Lung cancer. What should I know about heart disease, diabetes, and high blood pressure? Blood pressure and heart disease High blood pressure causes heart disease and increases the risk of  stroke. This is more likely to develop in people who have high blood pressure readings or are overweight. Talk with your health care provider about your target blood pressure readings. Have your blood pressure checked: Every 3-5 years if you are 2-66 years of age. Every year if you are 73 years old or older. If you are between the ages of 79 and 59 and are a current or former smoker, ask your health care provider if you should have a one-time screening for abdominal aortic aneurysm (AAA). Diabetes Have regular diabetes screenings. This checks your fasting blood sugar level. Have the screening done: Once every three years after age 80 if you are at a normal weight and have a low risk for diabetes. More often and at a younger age if you are overweight or have a high risk for diabetes. What should I know about preventing infection? Hepatitis B If you have a higher risk for hepatitis B, you should be screened for this virus. Talk with your health care provider to find out if you are at risk for hepatitis B infection. Hepatitis C Blood testing is recommended for: Everyone born from [birth year] through 1964. Anyone with known risk factors for hepatitis C. Sexually transmitted infections (STIs) You should be screened each year for STIs, including gonorrhea and chlamydia, if: You are sexually active and are younger than 70 years of age. You are older than 70 years of age and your health care provider tells you that you are at risk for this type of infection. Your sexual activity has changed since you were last screened, and you are at increased risk for chlamydia or gonorrhea. Ask your health care provider if you are at risk. Ask your health care provider about whether you are at high risk for HIV. Your health care provider may recommend a prescription medicine to help prevent HIV infection. If you choose to take medicine to prevent HIV, you should first get tested for HIV. You should then be tested every 3  months for as long as you are taking the medicine. Follow these instructions at home: Alcohol use Do not drink alcohol if your health care provider tells you not to drink. If you drink alcohol: Limit how much you have to 0-2 drinks a day. Know how much alcohol is in your drink. In the [place] one drink equals one 12 oz bottle of beer (355 mL), one 5 oz glass of wine (148 mL), or one 1 oz glass of hard liquor (44 mL). Lifestyle Do not use any products that contain nicotine or tobacco. These products include cigarettes, chewing tobacco, and vaping devices, such as e-cigarettes. If you need help quitting, ask your health care provider. Do not use street drugs. Do not share needles. Ask your health care provider for help if you need support or information about quitting drugs. General instructions Schedule regular health, dental, and eye exams. Stay current with your vaccines. Tell your health care provider if: You often feel depressed. You have ever been abused or do not feel safe at home. Summary Adopting a healthy lifestyle and getting  preventive care are important in promoting health and wellness. Follow your health care provider's instructions about healthy diet, exercising, and getting tested or screened for diseases. Follow your health care provider's instructions on monitoring your cholesterol and blood pressure. This information is not intended to replace advice given to you by your health care provider. Make sure you discuss any questions you have with your health care provider. Document Revised: 11/20/2020 Document Reviewed: 11/20/2020 Elsevier Patient Education  [place].

## 2021-08-14 ENCOUNTER — Other Ambulatory Visit: Payer: Self-pay | Admitting: Physician Assistant

## 2021-08-17 ENCOUNTER — Other Ambulatory Visit: Payer: Self-pay

## 2021-08-17 ENCOUNTER — Encounter: Payer: Self-pay | Admitting: Physician Assistant

## 2021-08-17 ENCOUNTER — Ambulatory Visit (INDEPENDENT_AMBULATORY_CARE_PROVIDER_SITE_OTHER): Payer: No Typology Code available for payment source | Admitting: Physician Assistant

## 2021-08-17 ENCOUNTER — Ambulatory Visit (INDEPENDENT_AMBULATORY_CARE_PROVIDER_SITE_OTHER): Payer: No Typology Code available for payment source

## 2021-08-17 VITALS — BP 134/84 | HR 64 | Ht 74.0 in | Wt 226.0 lb

## 2021-08-17 DIAGNOSIS — M19012 Primary osteoarthritis, left shoulder: Secondary | ICD-10-CM | POA: Diagnosis not present

## 2021-08-17 DIAGNOSIS — M25512 Pain in left shoulder: Secondary | ICD-10-CM | POA: Diagnosis not present

## 2021-08-17 DIAGNOSIS — L814 Other melanin hyperpigmentation: Secondary | ICD-10-CM

## 2021-08-17 DIAGNOSIS — Z131 Encounter for screening for diabetes mellitus: Secondary | ICD-10-CM | POA: Diagnosis not present

## 2021-08-17 DIAGNOSIS — Z806 Family history of leukemia: Secondary | ICD-10-CM

## 2021-08-17 DIAGNOSIS — E039 Hypothyroidism, unspecified: Secondary | ICD-10-CM

## 2021-08-17 DIAGNOSIS — E782 Mixed hyperlipidemia: Secondary | ICD-10-CM

## 2021-08-17 DIAGNOSIS — G8929 Other chronic pain: Secondary | ICD-10-CM

## 2021-08-17 DIAGNOSIS — R7301 Impaired fasting glucose: Secondary | ICD-10-CM | POA: Diagnosis not present

## 2021-08-17 DIAGNOSIS — F339 Major depressive disorder, recurrent, unspecified: Secondary | ICD-10-CM

## 2021-08-17 DIAGNOSIS — R413 Other amnesia: Secondary | ICD-10-CM

## 2021-08-17 DIAGNOSIS — M25551 Pain in right hip: Secondary | ICD-10-CM | POA: Diagnosis not present

## 2021-08-17 MED ORDER — VILAZODONE HCL 20 MG PO TABS: 20.0000 mg | ORAL_TABLET | Freq: Every day | ORAL | 3 refills | Status: DC

## 2021-08-17 MED ORDER — LEVOTHYROXINE SODIUM 25 MCG PO TABS: 25.0000 ug | ORAL_TABLET | Freq: Every day | ORAL | 3 refills | Status: DC

## 2021-08-17 NOTE — Progress Notes (Signed)
Subjective:    Patient ID: Marc Lowe, male    DOB: [birth year], 71 y.o.   MRN: [id number]  HPI Pt is a 71 yo male who presents to the clinic for follow up and medication refill.   He is 6 months past PE after covid. He is still on Xarelto.   He continues on viibryd and levothyroxine. No problems or concerns.   He does have some concerns:   He has a dark spot on left external ear. Notice it a week or so ago. Not changing. Denies any bleeding. Not raised.   Pt's sister passed away from [place] and he wants to be screened. Denies any fever, chills, night sweats.   He is having some left shoulder and right hip pain for a while. Some days are worse than others. No known injury. Noticing it has worsened since he is not taking aleve because of being on Xarelto.   He is concerned about his memory. He forgets things like names or details that he did not used to forget. This is very frustrating. His wife has noticed it as well.   .. 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   HYPOGONADISM, MALE 11/04/2006   Depression, recurrent ([place]) 11/04/2006   VARICES OF OTHER SITES 10/04/2009   CHRONIC RHINITIS 06/04/2007   PARONYCHIA, FINGER 07/13/2009   CELLULITIS AND ABSCESS OF OTHER SPECIFIED SITE 05/09/2008   ACNE ROSACEA 10/07/2006   SKIN RASH 07/13/2009   DIARRHEA 03/14/2010   ABDOMINAL PAIN, GENERALIZED 03/14/2010   IMPAIRED FASTING GLUCOSE 07/24/2009   ELEVATED PROSTATE SPECIFIC ANTIGEN 04/10/2009   LIBIDO, DECREASED 10/07/2006   History of bowel resection 04/14/2014   Testosterone deficiency 04/14/2014   Hyperlipidemia 04/15/2014   Cervical nerve root impingement 04/11/2015   Left lower quadrant abdominal pain 05/08/2016   History of diverticulitis 05/08/2016   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   Family history of atrial fibrillation 06/11/2016   Left shoulder pain 11/21/2016   Toenail fungus 12/16/2016   No energy 12/17/2016    Elevated TSH 12/18/2016   Anhedonia 05/13/2017   Abnormal weight gain 05/13/2017   Cervical radiculopathy 02/18/2020   DDD (degenerative disc disease), cervical 02/18/2020   Mixed hyperlipidemia 02/18/2020   Memory changes 02/18/2020   Seborrheic keratoses 02/21/2020   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   Need for zoster vaccination 05/29/2020   Acute respiratory failure with hypoxia ([place]) 12/27/2020   Intercostal pain 12/27/2020   Mid back pain on right side 12/27/2020   SOB (shortness of breath) 12/27/2020   COVID-19 virus infection 12/27/2020   Pulmonary embolus ([place]) 12/27/2020   Bilateral pulmonary embolism ([place]) 12/27/2020   Pulmonary embolism ([place]) 12/28/2020   Acute deep vein thrombosis (DVT) of proximal vein of left lower extremity ([place]) 01/03/2021   History of COVID-19 01/03/2021   Colonic diverticular abscess 01/04/2021   Hypothyroidism 01/04/2021   Chronic left shoulder pain 08/17/2021   Right hip pain 08/17/2021   Resolved Ambulatory Problems    Diagnosis Date Noted   HYPERLIPIDEMIA 10/07/2006   VIRAL URI 05/09/2008   Localized swelling of left lower extremity 07/02/2019   Past Medical History:  Diagnosis Date   BPH (benign prostatic hyperplasia)    Depression    Diverticulitis    Hypothyroidism (acquired)    Pulmonary emboli ([place])       Review of Systems See HPI.     Objective:   Physical Exam Vitals reviewed.  Constitutional:  Appearance: Normal appearance.  HENT:     Head: Normocephalic.     Ears:     Comments: Hyperpigmented macule of lower auricle of left ear Neck:     Vascular: No carotid bruit.  Cardiovascular:     Rate and Rhythm: Normal rate and regular rhythm.     Pulses: Normal pulses.     Heart sounds: No murmur heard. Pulmonary:     Effort: Pulmonary effort is normal.     Breath sounds: Normal breath sounds.  Musculoskeletal:        General: Normal range of motion.     Cervical back: Normal range of motion.      Right lower leg: No edema.     Left lower leg: No edema.  Lymphadenopathy:     Cervical: No cervical adenopathy.  Skin:    Findings: No rash.  Neurological:     General: No focal deficit present.     Mental Status: He is alert and oriented to person, place, and time.     Motor: No weakness.     Coordination: Coordination normal.     Gait: Gait normal.  Psychiatric:        Mood and Affect: Mood normal.      .. Depression screen [facility] 2/9 08/13/2021 03/07/2021 05/29/2020 05/25/2019 05/06/2018  Decreased Interest 0 0 0 0 1  Down, Depressed, Hopeless 0 0 0 0 0  PHQ - 2 Score 0 0 0 0 1  Altered sleeping - 0 - - 0  Tired, decreased energy - 0 - - 0  Change in appetite - 0 - - 0  Feeling bad or failure about yourself  - 0 - - 0  Trouble concentrating - 0 - - 0  Moving slowly or fidgety/restless - 0 - - 0  Suicidal thoughts - 0 - - 0  PHQ-9 Score - 0 - - 1  Difficult doing work/chores - Not difficult at all - - Not difficult at all   .[name] Kitchen GAD 7 : Generalized Anxiety Score 05/06/2018 05/13/2017 05/13/2017 12/16/2016  Nervous, Anxious, on Edge 0 0 0 1  Control/stop worrying 0 0 0 0  Worry too much - different things 0 0 0 0  Trouble relaxing 0 0 0 0  Restless 0 0 0 0  Easily annoyed or irritable 1 0 0 3  Afraid - awful might happen 0 0 0 0  Total GAD 7 Score 1 0 0 4  Anxiety Difficulty Not difficult at all - Not difficult at all -   .. [place] Cognitive Assessment  08/17/2021  Visuospatial/ Executive (0/5) 5  Naming (0/3) 3  Attention: Read list of digits (0/2) 2  Attention: Read list of letters (0/1) 1  Attention: Serial 7 subtraction starting at 100 (0/3) 3  Language: Repeat phrase (0/2) 2  Language : Fluency (0/1) 1  Abstraction (0/2) 2  Delayed Recall (0/5) 3  Orientation (0/6) 6  Total 28  Adjusted Score (based on education) 28        Assessment & Plan:  [name] KitchenMarland KitchenTaevyn was seen today for follow-up.  Diagnoses and all orders for this visit:  Chronic left shoulder  pain -     DG Shoulder Left; Future  Hypothyroidism, unspecified type -     levothyroxine (EUTHYROX) 25 MCG tablet; Take 1 tablet (25 mcg total) by mouth daily before breakfast. -     CBC with Differential/Platelet  Depression, recurrent (HCC) -     Vilazodone HCl (VIIBRYD) 20 MG TABS;  Take 1 tablet (20 mg total) by mouth daily. -     CBC with Differential/Platelet  Right hip pain -     DG Hip Unilat W OR W/O Pelvis 2-3 Views Right; Future  Mixed hyperlipidemia -     Lipid Panel w/reflex Direct LDL  Screening for diabetes mellitus -     COMPLETE METABOLIC PANEL WITH GFR  Family history of leukemia -     CBC with Differential/Platelet  Memory changes  Age spots   Refilled levothyroxine based on TSH levels.   PHQ/GAD numbers look great refilled Viibryd.   Reassured patient that spot on ear looked like age/sun spot. Continue to watch for any rapid growth, color change.   Stop Xarelto. Has been 6 months after provoked PE. Call with any changes.   Get xrays of shoulder and hip due to pain. Likely OA. Ok to use as needed NSAIDS. Ok to try turmeric 500mg  bid. Consider shoulder exercises and follow up with sports medicine provider Dr. Darene Lamer.   Reassured pt about memory changes.  MOCA looked great today. Will continue to monitor.  Could be some post covid brain fog and memory loss.  Work on TEFL teacher.  Get good rest and proper nutrition.  Consider b vitamin.   Follow up in 3 months.     Spent 40 minutes with patient reviewing chart, discussing concerns and plans.

## 2021-08-17 NOTE — Patient Instructions (Addendum)
Stop xarelto. Can now use NSAIDs.  Turmeric 500mg  twice a day for inflammation.   Memory Compensation Strategies  Use "WARM" strategy.  W= write it down  A= associate it  R= repeat it  M= make a mental note  2.   You can keep a Social worker.  Use a 3-ring notebook with sections for the following: calendar, important names and phone numbers,  medications, doctors' names/phone numbers, lists/reminders, and a section to journal what you did  each day.   3.    Use a calendar to write appointments down.  4.    Write yourself a schedule for the day.  This can be placed on the calendar or in a separate section of the Memory Notebook.  Keeping a  regular schedule can help memory.  5.    Use medication organizer with sections for each day or morning/evening pills.  You may need help loading it  6.    Keep a basket, or pegboard by the door.  Place items that you need to take out with you in the basket or on the pegboard.  You may also want to  include a message board for reminders.  7.    Use sticky notes.  Place sticky notes with reminders in a place where the task is performed.  For example: " turn off the  stove" placed by the stove, "lock the door" placed on the door at eye level, " take your medications" on  the bathroom mirror or by the place where you normally take your medications.  8.    Use alarms/timers.  Use while cooking to remind yourself to check on food or as a reminder to take your medicine, or as a  reminder to make a call, or as a reminder to perform another task, etc.   Shoulder Impingement Syndrome Rehab Ask your health care provider which exercises are safe for you. Do exercises exactly as told by your health care provider and adjust them as directed. It is normal to feel mild stretching, pulling, tightness, or discomfort as you do these exercises. Stop right away if you feel sudden pain or your pain gets worse. Do not begin these exercises until told by your health  care provider. Stretching and range-of-motion exercise This exercise warms up your muscles and joints and improves the movement and flexibility of your shoulder. This exercise also helps to relieve pain and stiffness. Passive horizontal adduction In passive adduction, you use your other hand to move the injured arm toward your body. The injured arm does not move on its own. In this movement, your arm is moved across your body in the horizontal plane (horizontal adduction). Sit or stand and pull your left / right elbow across your chest, toward your other shoulder. Stop when you feel a gentle stretch in the back of your shoulder and upper arm. Keep your arm at shoulder height. Keep your arm as close to your body as you comfortably can. Hold for __________ seconds. Slowly return to the starting position. Repeat __________ times. Complete this exercise __________ times a day. Strengthening exercises These exercises build strength and endurance in your shoulder. Endurance is the ability to use your muscles for a long time, even after they get tired. External rotation, isometric This is an exercise in which you press the back of your wrist against a door frame without moving your shoulder joint (isometric). Stand or sit in a doorway, facing the door frame. Bend your left / right elbow  and place the back of your wrist against the door frame. Only the back of your wrist should be touching the frame. Keep your upper arm at your side. Gently press your wrist against the door frame, as if you are trying to push your arm away from your abdomen (external rotation). Press as hard as you are able without pain. Avoid shrugging your shoulder while you press your wrist against the door frame. Keep your shoulder blade tucked down toward the middle of your back. Hold for __________ seconds. Slowly release the tension, and relax your muscles completely before you repeat the exercise. Repeat __________ times.  Complete this exercise __________ times a day. Internal rotation, isometric This is an exercise in which you press your palm against a door frame without moving your shoulder joint (isometric). Stand or sit in a doorway, facing the door frame. Bend your left / right elbow and place the palm of your hand against the door frame. Only your palm should be touching the frame. Keep your upper arm at your side. Gently press your hand against the door frame, as if you are trying to push your arm toward your abdomen (internal rotation). Press as hard as you are able without pain. Avoid shrugging your shoulder while you press your hand against the door frame. Keep your shoulder blade tucked down toward the middle of your back. Hold for __________ seconds. Slowly release the tension, and relax your muscles completely before you repeat the exercise. Repeat __________ times. Complete this exercise __________ times a day. Scapular protraction, supine  Lie on your back on a firm surface (supine position). Hold a __________ weight in your left / right hand. Raise your left / right arm straight into the air so your hand is directly above your shoulder joint. Push the weight into the air so your shoulder (scapula) lifts off the surface that you are lying on. The scapula will push up or forward (protraction). Do not move your head, neck, or back. Hold for __________ seconds. Slowly return to the starting position. Let your muscles relax completely before you repeat this exercise. Repeat __________ times. Complete this exercise __________ times a day. Scapular retraction  Sit in a stable chair without armrests, or stand up. Secure an exercise band to a stable object in front of you so the band is at shoulder height. Hold one end of the exercise band in each hand. Your palms should face down. Squeeze your shoulder blades together (retraction) and move your elbows slightly behind you. Do not shrug your shoulders  upward while you do this. Hold for __________ seconds. Slowly return to the starting position. Repeat __________ times. Complete this exercise __________ times a day. Shoulder extension  Sit in a stable chair without armrests, or stand up. Secure an exercise band to a stable object in front of you so the band is above shoulder height. Hold one end of the exercise band in each hand. Straighten your elbows and lift your hands up to shoulder height. Squeeze your shoulder blades together and pull your hands down to the sides of your thighs (extension). Stop when your hands are straight down by your sides. Do not let your hands go behind your body. Hold for __________ seconds. Slowly return to the starting position. Repeat __________ times. Complete this exercise __________ times a day. This information is not intended to replace advice given to you by your health care provider. Make sure you discuss any questions you have with your health care  provider. Document Revised: 10/23/2018 Document Reviewed: 07/27/2018 Elsevier Patient Education  Marc Lowe.

## 2021-08-20 ENCOUNTER — Encounter: Payer: Self-pay | Admitting: Physician Assistant

## 2021-08-20 DIAGNOSIS — M19012 Primary osteoarthritis, left shoulder: Secondary | ICD-10-CM | POA: Insufficient documentation

## 2021-08-20 DIAGNOSIS — M1611 Unilateral primary osteoarthritis, right hip: Secondary | ICD-10-CM | POA: Insufficient documentation

## 2021-08-20 NOTE — Progress Notes (Signed)
Kidney, liver, electrolytes look good.  Fasting glucose up just a hair. Add A1C for better evaluation.  Hemoglobin looks good.  Platelets look good.  WBC a little low. Recheck in 1 month.  LDL, bad cholesterol, not to primary prevention goal. Current 10 year risk is 18.4 percent. Strongly advise to start statin and ASA 81mg  daily because you are off [place]. Thoughts?  HDL, good cholesterol, looks good.

## 2021-08-20 NOTE — Progress Notes (Signed)
More arthritis in right hip. Physical therapy, consider injections with Dr. Darene Lamer, NsAIDs orally can all help.

## 2021-08-20 NOTE — Progress Notes (Signed)
No acute changes but arthritis seen in [facility] joint. Could benefit from physical therapy and injection consideration from Dr. Darene Lamer? Thoughts?

## 2021-08-21 ENCOUNTER — Encounter: Payer: Self-pay | Admitting: Physician Assistant

## 2021-08-22 LAB — COMPLETE METABOLIC PANEL WITH GFR
AG Ratio: 1.9 (calc) (ref 1.0–2.5)
ALT: 25 U/L (ref 9–46)
AST: 22 U/L (ref 10–35)
Albumin: 4.4 g/dL (ref 3.6–5.1)
Alkaline phosphatase (APISO): 67 U/L (ref 35–144)
BUN: 19 mg/dL (ref 7–25)
CO2: 29 mmol/L (ref 20–32)
Calcium: 9.7 mg/dL (ref 8.6–10.3)
Chloride: 104 mmol/L (ref 98–110)
Creat: 1.06 mg/dL (ref 0.70–1.35)
Globulin: 2.3 g/dL (calc) (ref 1.9–3.7)
Glucose, Bld: 100 mg/dL — ABNORMAL HIGH (ref 65–99)
Potassium: 5.1 mmol/L (ref 3.5–5.3)
Sodium: 140 mmol/L (ref 135–146)
Total Bilirubin: 0.6 mg/dL (ref 0.2–1.2)
Total Protein: 6.7 g/dL (ref 6.1–8.1)
eGFR: 76 mL/min/{1.73_m2} (ref >=60)

## 2021-08-22 LAB — CBC WITH DIFFERENTIAL/PLATELET
Absolute Monocytes: 439 cells/uL (ref 200–950)
Basophils Absolute: 29 cells/uL (ref 0–200)
Basophils Relative: 0.8 %
Eosinophils Absolute: 50 cells/uL (ref 15–500)
Eosinophils Relative: 1.4 %
HCT: 45.3 % (ref 38.5–50.0)
Hemoglobin: 15.3 g/dL (ref 13.2–17.1)
Lymphs Abs: 1163 cells/uL (ref 850–3900)
MCH: 29.8 pg (ref 27.0–33.0)
MCHC: 33.8 g/dL (ref 32.0–36.0)
MCV: 88.1 fL (ref 80.0–100.0)
MPV: 10 fL (ref 7.5–12.5)
Monocytes Relative: 12.2 %
Neutro Abs: 1919 cells/uL (ref 1500–7800)
Neutrophils Relative %: 53.3 %
Platelets: 205 10*3/uL (ref 140–400)
RBC: 5.14 10*6/uL (ref 4.20–5.80)
RDW: 12.7 % (ref 11.0–15.0)
Total Lymphocyte: 32.3 %
WBC: 3.6 10*3/uL — ABNORMAL LOW (ref 3.8–10.8)

## 2021-08-22 LAB — LIPID PANEL W/REFLEX DIRECT LDL
Cholesterol: 197 mg/dL (ref <200)
HDL: 48 mg/dL (ref >=40)
LDL Cholesterol (Calc): 132 mg/dL (calc) — ABNORMAL HIGH
Non-HDL Cholesterol (Calc): 149 mg/dL (calc) — ABNORMAL HIGH (ref <130)
Total CHOL/HDL Ratio: 4.1 (calc) (ref <5.0)
Triglycerides: 75 mg/dL (ref <150)

## 2021-08-22 LAB — HEMOGLOBIN A1C W/OUT EAG: Hgb A1c MFr Bld: 5.4 % of total Hgb (ref <5.7)

## 2021-08-22 MED ORDER — ROSUVASTATIN CALCIUM 20 MG PO TABS: 20.0000 mg | ORAL_TABLET | Freq: Every day | ORAL | 3 refills | Status: DC

## 2021-08-22 NOTE — Addendum Note (Signed)
Addended by: Donella Stade on: 08/22/2021 05:59 AM   Modules accepted: Orders

## 2021-08-28 ENCOUNTER — Inpatient Hospital Stay: Payer: No Typology Code available for payment source | Attending: Hematology & Oncology

## 2021-08-28 ENCOUNTER — Inpatient Hospital Stay (HOSPITAL_BASED_OUTPATIENT_CLINIC_OR_DEPARTMENT_OTHER): Payer: No Typology Code available for payment source | Admitting: Hematology & Oncology

## 2021-08-28 ENCOUNTER — Other Ambulatory Visit: Payer: Self-pay

## 2021-08-28 ENCOUNTER — Encounter: Payer: Self-pay | Admitting: Hematology & Oncology

## 2021-08-28 VITALS — BP 126/85 | HR 69 | Temp 98.5°F | Resp 18 | Ht 74.5 in | Wt 224.5 lb

## 2021-08-28 DIAGNOSIS — I82402 Acute embolism and thrombosis of unspecified deep veins of left lower extremity: Secondary | ICD-10-CM | POA: Insufficient documentation

## 2021-08-28 DIAGNOSIS — Z7901 Long term (current) use of anticoagulants: Secondary | ICD-10-CM | POA: Diagnosis not present

## 2021-08-28 DIAGNOSIS — I2699 Other pulmonary embolism without acute cor pulmonale: Secondary | ICD-10-CM | POA: Diagnosis not present

## 2021-08-28 DIAGNOSIS — I824Y2 Acute embolism and thrombosis of unspecified deep veins of left proximal lower extremity: Secondary | ICD-10-CM

## 2021-08-28 DIAGNOSIS — I2601 Septic pulmonary embolism with acute cor pulmonale: Secondary | ICD-10-CM

## 2021-08-28 DIAGNOSIS — R760 Raised antibody titer: Secondary | ICD-10-CM

## 2021-08-28 LAB — CMP (CANCER CENTER ONLY)
ALT: 24 U/L (ref 0–44)
AST: 23 U/L (ref 15–41)
Albumin: 4 g/dL (ref 3.5–5.0)
Alkaline Phosphatase: 61 U/L (ref 38–126)
Anion gap: 6 (ref 5–15)
BUN: 15 mg/dL (ref 8–23)
CO2: 30 mmol/L (ref 22–32)
Calcium: 9 mg/dL (ref 8.9–10.3)
Chloride: 103 mmol/L (ref 98–111)
Creatinine: 1.01 mg/dL (ref 0.61–1.24)
GFR, Estimated: >60 mL/min (ref >60)
Glucose, Bld: 99 mg/dL (ref 70–99)
Potassium: 4.3 mmol/L (ref 3.5–5.1)
Sodium: 139 mmol/L (ref 135–145)
Total Bilirubin: 0.6 mg/dL (ref 0.3–1.2)
Total Protein: 6.8 g/dL (ref 6.5–8.1)

## 2021-08-28 LAB — CBC WITH DIFFERENTIAL (CANCER CENTER ONLY)
Abs Immature Granulocytes: 0 10*3/uL (ref 0.00–0.07)
Basophils Absolute: 0 10*3/uL (ref 0.0–0.1)
Basophils Relative: 1 %
Eosinophils Absolute: 0.1 10*3/uL (ref 0.0–0.5)
Eosinophils Relative: 1 %
HCT: 45.7 % (ref 39.0–52.0)
Hemoglobin: 15.4 g/dL (ref 13.0–17.0)
Immature Granulocytes: 0 %
Lymphocytes Relative: 28 %
Lymphs Abs: 1.1 10*3/uL (ref 0.7–4.0)
MCH: 29.8 pg (ref 26.0–34.0)
MCHC: 33.7 g/dL (ref 30.0–36.0)
MCV: 88.6 fL (ref 80.0–100.0)
Monocytes Absolute: 0.4 10*3/uL (ref 0.1–1.0)
Monocytes Relative: 10 %
Neutro Abs: 2.4 10*3/uL (ref 1.7–7.7)
Neutrophils Relative %: 60 %
Platelet Count: 195 10*3/uL (ref 150–400)
RBC: 5.16 MIL/uL (ref 4.22–5.81)
RDW: 12.8 % (ref 11.5–15.5)
WBC Count: 3.9 10*3/uL — ABNORMAL LOW (ref 4.0–10.5)
nRBC: 0 % (ref 0.0–0.2)

## 2021-08-28 LAB — LACTATE DEHYDROGENASE: LDH: 156 U/L (ref 98–192)

## 2021-08-28 LAB — D-DIMER, QUANTITATIVE: D-Dimer, Quant: 0.27 ug/mL-FEU (ref 0.00–0.50)

## 2021-08-28 NOTE — Progress Notes (Signed)
Hematology and Oncology Follow Up Visit  Marc Lowe [id number] March 22, 1951 71 y.o. 08/28/2021   Principle Diagnosis:  Bilateral pulmonary emboli/left lower extremity DVT --possible lupus anticoagulant  Current Therapy:   Xarelto 20 mg p.o. daily-started 12/2020     Interim History:  Marc Lowe is back for follow-up.  This is a second office visit.  We saw him back in July for the first time.  It is possible that he may have had a DVT secondary to COVID.  He did have a positive lupus anticoagulant when we first saw him.  We are rechecking this to see if this is truly positive.  He did have a follow-up CT angiogram done.  This was done on 04/11/2021.  This showed resolution of the pulmonary emboli.  He had a Doppler done of his left leg on 04/11/2021.  This showed resolution of the lower extremity DVT although there is still a DVT in the gastrocnemius vein.  He has had no problems with bleeding.  He has had no problems with nausea or vomiting.  There are no change in bowel or bladder habits.  He has had no leg swelling.  He has had no cough or shortness of breath.  There is been no chest wall pain.  Overall, his performance status is ECOG 0.  Medications:  Current Outpatient Medications:    levothyroxine (EUTHYROX) 25 MCG tablet, Take 1 tablet (25 mcg total) by mouth daily before breakfast., Disp: 90 tablet, Rfl: 3   Sildenafil Citrate (VIAGRA PO), Take by mouth., Disp: , Rfl:    tamsulosin (FLOMAX) 0.4 MG CAPS capsule, Take 0.4 mg by mouth at bedtime., Disp: , Rfl:    Vilazodone HCl (VIIBRYD) 20 MG TABS, Take 1 tablet (20 mg total) by mouth daily., Disp: 90 tablet, Rfl: 3   rosuvastatin (CRESTOR) 20 MG tablet, Take 1 tablet (20 mg total) by mouth daily., Disp: 90 tablet, Rfl: 3  Allergies:  Allergies  Allergen Reactions   Nsaids Other (See Comments)    SHOULD NOT HAVE THIS CLASS OF MEDICATION- HISTORY OF DIVERTICULITIS   Hydromorphone Hcl Nausea And Vomiting    Past Medical  History, Surgical history, Social history, and Family History were reviewed and updated.  Review of Systems: Review of Systems  Constitutional: Negative.   HENT:  Negative.    Eyes: Negative.   Respiratory: Negative.    Cardiovascular: Negative.   Gastrointestinal: Negative.   Endocrine: Negative.   Genitourinary: Negative.    Musculoskeletal: Negative.   Skin: Negative.   Neurological: Negative.   Hematological: Negative.   Psychiatric/Behavioral: Negative.     Physical Exam:  height is 6' 2.5" (1.892 m) and weight is 224 lb 8 oz (101.8 kg). His oral temperature is 98.5 F (36.9 C). His blood pressure is 126/85 and his pulse is 69. His respiration is 18 and oxygen saturation is 98%.   Wt Readings from Last 3 Encounters:  08/28/21 224 lb 8 oz (101.8 kg)  08/17/21 226 lb (102.5 kg)  02/07/21 212 lb (96.2 kg)    Physical Exam Vitals reviewed.  HENT:     Head: Normocephalic and atraumatic.  Eyes:     Pupils: Pupils are equal, round, and reactive to light.  Cardiovascular:     Rate and Rhythm: Normal rate and regular rhythm.     Heart sounds: Normal heart sounds.  Pulmonary:     Effort: Pulmonary effort is normal.     Breath sounds: Normal breath sounds.  Abdominal:  General: Bowel sounds are normal.     Palpations: Abdomen is soft.  Musculoskeletal:        General: No tenderness or deformity. Normal range of motion.     Cervical back: Normal range of motion.  Lymphadenopathy:     Cervical: No cervical adenopathy.  Skin:    General: Skin is warm and dry.     Findings: No erythema or rash.  Neurological:     Mental Status: He is alert and oriented to person, place, and time.  Psychiatric:        Behavior: Behavior normal.        Thought Content: Thought content normal.        Judgment: Judgment normal.     Lab Results  Component Value Date   WBC 3.9 (L) 08/28/2021   HGB 15.4 08/28/2021   HCT 45.7 08/28/2021   MCV 88.6 08/28/2021   PLT 195 08/28/2021      Chemistry      Component Value Date/Time   NA 139 08/28/2021 0835   K 4.3 08/28/2021 0835   CL 103 08/28/2021 0835   CO2 30 08/28/2021 0835   BUN 15 08/28/2021 0835   CREATININE 1.01 08/28/2021 0835   CREATININE 1.06 08/17/2021 0000      Component Value Date/Time   CALCIUM 9.0 08/28/2021 0835   ALKPHOS 61 08/28/2021 0835   AST 23 08/28/2021 0835   ALT 24 08/28/2021 0835   BILITOT 0.6 08/28/2021 0835      Impression and Plan: Marc Lowe is a very nice 71 year old white male.  He is rising from [place].  We talked a lot about [place].  He had a pulmonary embolus.  He had a left lower extremity DVT.  There could certainly be associated with the COVID-vaccine.    He did have positive lupus anticoagulant when we saw him initially.  We are repeating this today.  I would keep him on full dose Xarelto for another 3 to 4 months.  I would then put him on maintenance Xarelto for 1 year.  He is having no problems with the Xarelto.  He is having no bleeding.  I will repeat a Doppler of his left leg when we see him back.  I think this would be reasonable.  I am just glad that there is resolution of the pulmonary emboli.  Looks like there is been fairly good response in the leg.   Volanda Napoleon, MD 2/14/202312:23 PM

## 2021-08-29 LAB — DRVVT MIX: dRVVT Mix: 69.4 s — ABNORMAL HIGH (ref 0.0–40.4)

## 2021-08-29 LAB — HEXAGONAL PHASE PHOSPHOLIPID: Hexagonal Phase Phospholipid: 5 s (ref 0–11)

## 2021-08-29 LAB — LUPUS ANTICOAGULANT PANEL
DRVVT: 109.8 s — ABNORMAL HIGH (ref 0.0–47.0)
PTT Lupus Anticoagulant: 61.3 s — ABNORMAL HIGH (ref 0.0–43.5)

## 2021-08-29 LAB — PTT-LA MIX: PTT-LA Mix: 51.6 s — ABNORMAL HIGH (ref 0.0–40.5)

## 2021-08-29 LAB — DRVVT CONFIRM: dRVVT Confirm: 1.7 ratio — ABNORMAL HIGH (ref 0.8–1.2)

## 2021-09-05 ENCOUNTER — Telehealth: Payer: Self-pay

## 2021-09-05 NOTE — Telephone Encounter (Signed)
Per Dr Marin Olp - nothing to worry about. The positive result will come and go. Pt advised and states understanding.

## 2021-09-05 NOTE — Telephone Encounter (Signed)
Called and informed patient of lab results, patient verbalized understanding and would like to know if he needs to do anything since it is positive.

## 2021-09-05 NOTE — Telephone Encounter (Signed)
-----   Message from Volanda Napoleon, MD sent at 09/05/2021 10:51 AM EST ----- Please call and let him know that he does have the lupus anticoagulant.  It is still positive.  I suppose I am not surprised.  [name]

## 2021-09-23 ENCOUNTER — Encounter: Payer: Self-pay | Admitting: Physician Assistant

## 2021-10-24 DIAGNOSIS — H524 Presbyopia: Secondary | ICD-10-CM | POA: Diagnosis not present

## 2021-10-24 DIAGNOSIS — Z961 Presence of intraocular lens: Secondary | ICD-10-CM | POA: Diagnosis not present

## 2021-10-30 ENCOUNTER — Ambulatory Visit (INDEPENDENT_AMBULATORY_CARE_PROVIDER_SITE_OTHER): Payer: No Typology Code available for payment source | Admitting: Sports Medicine

## 2021-10-30 DIAGNOSIS — M47816 Spondylosis without myelopathy or radiculopathy, lumbar region: Secondary | ICD-10-CM | POA: Diagnosis not present

## 2021-10-30 DIAGNOSIS — K439 Ventral hernia without obstruction or gangrene: Secondary | ICD-10-CM | POA: Insufficient documentation

## 2021-10-30 NOTE — Progress Notes (Signed)
? ? ?  Procedures performed today:   ? ?None. ? ?Independent interpretation of notes and tests performed by another provider:  ? ?I did review his CT scan from last year that shows multilevel lumbar spondylosis with disc disease L4-5 and L5-S1 but worst finding is facet arthritis on the right and the bottom 3 levels. ? ?Brief History, Exam, Impression, and Recommendations:   ? ?Lumbar spondylosis ?This is a pleasant 71 year old male, he is having some axial low back pain, right worse than left, worse during the follow-through phase of his swing, this is facetogenic pain. ?He did some chiropractic manipulation and some stretching and symptoms have improved dramatically. ?I did review his CT scan from last year that shows multilevel lumbar spondylosis with disc disease L4-5 and L5-S1 but worst finding is facet arthritis on the right and the bottom 3 levels. ?Currently on Eliquis so we are unable to use NSAIDs, he can use arthritis from Tylenol as needed, I have added some advanced herniated disc home conditioning exercises, we went over the CT together, he can return to see me on an as-needed basis. ? ?Hernia, ventral ?Also noted midline hernia. ?Fairly large, no evidence of incarceration or strangulation. ?He did have a CT scan from last year that did also show a spigelian hernia. 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 ?He will get the CT burned to a disc for their review. ? ? ? ?___________________________________________ ?Gwen Her. Dianah Field, M.D., ABFM., CAQSM. ?Primary Care and Sports Medicine ?[place] ? ?Adjunct Instructor of Family Medicine  ?[employer] ?

## 2021-10-30 NOTE — Assessment & Plan Note (Signed)
Also noted midline hernia. ?Fairly large, no evidence of incarceration or strangulation. ?He did have a CT scan from last year that did also show a spigelian hernia. 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 ?He will get the CT burned to a disc for their review. ?

## 2021-10-30 NOTE — Assessment & Plan Note (Signed)
This is a pleasant 71 year old male, he is having some axial low back pain, right worse than left, worse during the follow-through phase of his swing, this is facetogenic pain. ?He did some chiropractic manipulation and some stretching and symptoms have improved dramatically. ?I did review his CT scan from last year that shows multilevel lumbar spondylosis with disc disease L4-5 and L5-S1 but worst finding is facet arthritis on the right and the bottom 3 levels. ?Currently on Eliquis so we are unable to use NSAIDs, he can use arthritis from Tylenol as needed, I have added some advanced herniated disc home conditioning exercises, we went over the CT together, he can return to see me on an as-needed basis. ?

## 2021-11-13 ENCOUNTER — Inpatient Hospital Stay (HOSPITAL_BASED_OUTPATIENT_CLINIC_OR_DEPARTMENT_OTHER): Payer: No Typology Code available for payment source | Admitting: Hematology & Oncology

## 2021-11-13 ENCOUNTER — Inpatient Hospital Stay: Payer: No Typology Code available for payment source | Attending: Hematology & Oncology

## 2021-11-13 ENCOUNTER — Ambulatory Visit (HOSPITAL_BASED_OUTPATIENT_CLINIC_OR_DEPARTMENT_OTHER)
Admission: RE | Admit: 2021-11-13 | Discharge: 2021-11-13 | Disposition: A | Discharge status: Home / Self Care | Payer: No Typology Code available for payment source | Source: Ambulatory Visit | Attending: Hematology & Oncology | Admitting: Hematology & Oncology

## 2021-11-13 ENCOUNTER — Encounter: Payer: Self-pay | Admitting: Hematology & Oncology

## 2021-11-13 VITALS — BP 129/78 | HR 61 | Temp 98.6°F | Resp 18 | Ht 74.0 in | Wt 231.2 lb

## 2021-11-13 DIAGNOSIS — D6862 Lupus anticoagulant syndrome: Secondary | ICD-10-CM | POA: Insufficient documentation

## 2021-11-13 DIAGNOSIS — I82452 Acute embolism and thrombosis of left peroneal vein: Secondary | ICD-10-CM | POA: Insufficient documentation

## 2021-11-13 DIAGNOSIS — I824Y2 Acute embolism and thrombosis of unspecified deep veins of left proximal lower extremity: Secondary | ICD-10-CM

## 2021-11-13 DIAGNOSIS — Z7901 Long term (current) use of anticoagulants: Secondary | ICD-10-CM | POA: Insufficient documentation

## 2021-11-13 DIAGNOSIS — K439 Ventral hernia without obstruction or gangrene: Secondary | ICD-10-CM | POA: Diagnosis not present

## 2021-11-13 DIAGNOSIS — I82890 Acute embolism and thrombosis of other specified veins: Secondary | ICD-10-CM | POA: Insufficient documentation

## 2021-11-13 DIAGNOSIS — I82462 Acute embolism and thrombosis of left calf muscular vein: Secondary | ICD-10-CM | POA: Insufficient documentation

## 2021-11-13 DIAGNOSIS — I82512 Chronic embolism and thrombosis of left femoral vein: Secondary | ICD-10-CM | POA: Insufficient documentation

## 2021-11-13 DIAGNOSIS — I2601 Septic pulmonary embolism with acute cor pulmonale: Secondary | ICD-10-CM

## 2021-11-13 DIAGNOSIS — I2699 Other pulmonary embolism without acute cor pulmonale: Secondary | ICD-10-CM | POA: Diagnosis not present

## 2021-11-13 DIAGNOSIS — I82412 Acute embolism and thrombosis of left femoral vein: Secondary | ICD-10-CM | POA: Diagnosis not present

## 2021-11-13 DIAGNOSIS — I82402 Acute embolism and thrombosis of unspecified deep veins of left lower extremity: Secondary | ICD-10-CM | POA: Diagnosis not present

## 2021-11-13 LAB — CBC WITH DIFFERENTIAL (CANCER CENTER ONLY)
Abs Immature Granulocytes: 0.01 10*3/uL (ref 0.00–0.07)
Basophils Absolute: 0 10*3/uL (ref 0.0–0.1)
Basophils Relative: 0 %
Eosinophils Absolute: 0 10*3/uL (ref 0.0–0.5)
Eosinophils Relative: 1 %
HCT: 44.2 % (ref 39.0–52.0)
Hemoglobin: 14.8 g/dL (ref 13.0–17.0)
Immature Granulocytes: 0 %
Lymphocytes Relative: 23 %
Lymphs Abs: 1 10*3/uL (ref 0.7–4.0)
MCH: 29.6 pg (ref 26.0–34.0)
MCHC: 33.5 g/dL (ref 30.0–36.0)
MCV: 88.4 fL (ref 80.0–100.0)
Monocytes Absolute: 0.4 10*3/uL (ref 0.1–1.0)
Monocytes Relative: 10 %
Neutro Abs: 2.7 10*3/uL (ref 1.7–7.7)
Neutrophils Relative %: 66 %
Platelet Count: 187 10*3/uL (ref 150–400)
RBC: 5 MIL/uL (ref 4.22–5.81)
RDW: 13.2 % (ref 11.5–15.5)
WBC Count: 4.1 10*3/uL (ref 4.0–10.5)
nRBC: 0 % (ref 0.0–0.2)

## 2021-11-13 LAB — CMP (CANCER CENTER ONLY)
ALT: 32 U/L (ref 0–44)
AST: 24 U/L (ref 15–41)
Albumin: 4.2 g/dL (ref 3.5–5.0)
Alkaline Phosphatase: 66 U/L (ref 38–126)
Anion gap: 6 (ref 5–15)
BUN: 17 mg/dL (ref 8–23)
CO2: 29 mmol/L (ref 22–32)
Calcium: 9.5 mg/dL (ref 8.9–10.3)
Chloride: 104 mmol/L (ref 98–111)
Creatinine: 0.96 mg/dL (ref 0.61–1.24)
GFR, Estimated: >60 mL/min (ref >60)
Glucose, Bld: 104 mg/dL — ABNORMAL HIGH (ref 70–99)
Potassium: 4.2 mmol/L (ref 3.5–5.1)
Sodium: 139 mmol/L (ref 135–145)
Total Bilirubin: 0.4 mg/dL (ref 0.3–1.2)
Total Protein: 6.7 g/dL (ref 6.5–8.1)

## 2021-11-13 LAB — D-DIMER, QUANTITATIVE: D-Dimer, Quant: <0.27 ug/mL-FEU (ref 0.00–0.50)

## 2021-11-13 NOTE — Progress Notes (Signed)
?Hematology and Oncology Follow Up Visit ? ?Marc Lowe ?[id number] ?06-19-1951 71 yMarco. ?11/13/2021 ? ? ?Principle Diagnosis:  ?Bilateral pulmonary emboli/left lower extremity DVT --(+) lupus anticoagulant ? ?Current Therapy:   ?Xarelto 20 mg pMarco. daily-started 12/2020 ?EC ASA 81 mg po q day -- start on 11/13/2021 ?    ?Interim History:  Marc Lowe is back for follow-up.  He is doing quite well.  He really has no complaints.  Did not plan golf.  He is waiting for his new set of golf clubs to be delivered.  I am so happy that he is able to be so active. ? ?We did do a Doppler of his leg today.  This was his LEFT leg.  This did show the nonocclusive thrombus in the left profundofemoral, peroneal and gastrocnemius veins. ? ?He does have a positive lupus anticoagulant I rechecked over last saw him. ? ?Told him that we really probably need to start him on some baby aspirin in addition to the Xarelto. ? ?He has had no problems with leg swelling.  There is no leg pain.  He has had no bleeding or bruising.  He has had no cough or shortness of breath.  He has had no chest wall pain. ? ?Overall, I would say his performance status is ECOG 0.   ? ?Medications:  ?Current Outpatient Medications:  ?  levothyroxine (EUTHYROX) 25 MCG tablet, Take 1 tablet (25 mcg total) by mouth daily before breakfast., Disp: 90 tablet, Rfl: 3 ?  rivaroxaban (XARELTO) 20 MG TABS tablet, Take 20 mg by mouth daily with supper., Disp: , Rfl:  ?  Sildenafil Citrate (VIAGRA PO), Take by mouth., Disp: , Rfl:  ?  tamsulosin (FLOMAX) 0Marc4 MG CAPS capsule, Take 0Marc4 mg by mouth at bedtime., Disp: , Rfl:  ?  Vilazodone HCl (VIIBRYD) 20 MG TABS, Take 1 tablet (20 mg total) by mouth daily., Disp: 90 tablet, Rfl: 3 ? ?Allergies:  ?No Active Allergies ? ? ?Past Medical History, Surgical history, Social history, and Family History were reviewed and updated. ? ?Review of Systems: ?Review of Systems  ?Constitutional: Negative.   ?HENT:  Negative.    ?Eyes: Negative.    ?Respiratory: Negative.    ?Cardiovascular: Negative.   ?Gastrointestinal: Negative.   ?Endocrine: Negative.   ?Genitourinary: Negative.    ?Musculoskeletal: Negative.   ?Skin: Negative.   ?Neurological: Negative.   ?Hematological: Negative.   ?Psychiatric/Behavioral: Negative.    ? ?Physical Exam: ? height is '6\' 2"'$  (1Marc88 m) and weight is 231 lb 4 oz (104Marc9 kg). His oral temperature is 98Marc6 ?F (37 ?C). His blood pressure is 129/78 and his pulse is 61. His respiration is 18 and oxygen saturation is 100%.  ? ?Wt Readings from Last 3 Encounters:  ?11/13/21 231 lb 4 oz (104Marc9 kg)  ?08/28/21 224 lb 8 oz (101Marc8 kg)  ?08/17/21 226 lb (102Marc5 kg)  ? ? ?Physical Exam ?Vitals reviewed.  ?HENT:  ?   Head: Normocephalic and atraumatic.  ?Eyes:  ?   Pupils: Pupils are equal, round, and reactive to light.  ?Cardiovascular:  ?   Rate and Rhythm: Normal rate and regular rhythm.  ?   Heart sounds: Normal heart sounds.  ?Pulmonary:  ?   Effort: Pulmonary effort is normal.  ?   Breath sounds: Normal breath sounds.  ?Abdominal:  ?   General: Bowel sounds are normal.  ?   Palpations: Abdomen is soft.  ?Musculoskeletal:     ?   General: No  tenderness or deformity. Normal range of motion.  ?   Cervical back: Normal range of motion.  ?Lymphadenopathy:  ?   Cervical: No cervical adenopathy.  ?Skin: ?   General: Skin is warm and dry.  ?   Findings: No erythema or rash.  ?Neurological:  ?   Mental Status: He is alert and oriented to person, place, and time.  ?Psychiatric:     ?   Behavior: Behavior normal.     ?   Thought Content: Thought content normal.     ?   Judgment: Judgment normal.  ? ? ? ?Lab Results  ?Component Value Date  ? WBC 4Marc1 11/13/2021  ? HGB 14Marc8 11/13/2021  ? HCT 44Marc2 11/13/2021  ? MCV 88Marc4 11/13/2021  ? PLT 187 11/13/2021  ? ?  Chemistry   ?   ?Component Value Date/Time  ? NA 139 11/13/2021 0837  ? K 4Marc2 11/13/2021 0837  ? CL 104 11/13/2021 0837  ? CO2 29 11/13/2021 0837  ? BUN 17 11/13/2021 0837  ? CREATININE 0Marc96  11/13/2021 0837  ? CREATININE 1Marc06 08/17/2021 0000  ?    ?Component Value Date/Time  ? CALCIUM 9Marc5 11/13/2021 0837  ? ALKPHOS 66 11/13/2021 0837  ? AST 24 11/13/2021 0837  ? ALT 32 11/13/2021 0837  ? BILITOT 0Marc4 11/13/2021 0837  ?  ? ? ?Impression and Plan: ?Mr. Lowe is a very nice 71 year old white male.  He is rising from [place].  We talked a lot about [place]. ? ?He had a pulmonary embolus.  He had a left lower extremity DVT.  Again, he has a positive lupus anticoagulant.  I have to believe that this is clearly the most likely culprit for his thromboembolic disease. ? ?For now, I will have to keep him on full dose Xarelto.  He Fiagon need full dose for at least a year. ? ?We will add the aspirin.  We will have to see how his blood clot responds. ? ?I would do another Doppler of his leg when we see him back.  I would like to see him back sometime in August. ? ?Told to make sure that he stays well-hydrated, when he is out playing golf.   ? ? ?Volanda Napoleon, MD ?5/2/20239:35 AM  ?

## 2021-11-15 LAB — BETA-2-GLYCOPROTEIN I ABS, IGG/M/A
Beta-2 Glyco I IgG: <9 GPI IgG units (ref 0–20)
Beta-2-Glycoprotein I IgA: 13 GPI IgA units (ref 0–25)
Beta-2-Glycoprotein I IgM: <9 GPI IgM units (ref 0–32)

## 2021-11-16 LAB — CARDIOLIPIN ANTIBODIES, IGG, IGM, IGA
Anticardiolipin IgA: <9 APL U/mL (ref 0–11)
Anticardiolipin IgG: <9 GPL U/mL (ref 0–14)
Anticardiolipin IgM: <9 MPL U/mL (ref 0–12)

## 2021-11-17 LAB — DRVVT CONFIRM: dRVVT Confirm: 1.6 ratio — ABNORMAL HIGH (ref 0.8–1.2)

## 2021-11-17 LAB — PTT-LA MIX: PTT-LA Mix: 43.8 s — ABNORMAL HIGH (ref 0.0–40.5)

## 2021-11-17 LAB — LUPUS ANTICOAGULANT PANEL
DRVVT: 83.6 s — ABNORMAL HIGH (ref 0.0–47.0)
PTT Lupus Anticoagulant: 52.1 s — ABNORMAL HIGH (ref 0.0–43.5)

## 2021-11-17 LAB — HEXAGONAL PHASE PHOSPHOLIPID: Hexagonal Phase Phospholipid: 6 s (ref 0–11)

## 2021-11-17 LAB — DRVVT MIX: dRVVT Mix: 58.2 s — ABNORMAL HIGH (ref 0.0–40.4)

## 2021-12-18 ENCOUNTER — Encounter: Payer: Self-pay | Admitting: Physician Assistant

## 2021-12-18 ENCOUNTER — Ambulatory Visit (INDEPENDENT_AMBULATORY_CARE_PROVIDER_SITE_OTHER): Payer: No Typology Code available for payment source | Admitting: Physician Assistant

## 2021-12-18 VITALS — BP 109/71 | HR 81 | Ht 74.0 in | Wt 232.0 lb

## 2021-12-18 DIAGNOSIS — M545 Low back pain, unspecified: Secondary | ICD-10-CM | POA: Diagnosis not present

## 2021-12-18 DIAGNOSIS — R760 Raised antibody titer: Secondary | ICD-10-CM

## 2021-12-18 DIAGNOSIS — W19XXXA Unspecified fall, initial encounter: Secondary | ICD-10-CM | POA: Insufficient documentation

## 2021-12-18 DIAGNOSIS — I82502 Chronic embolism and thrombosis of unspecified deep veins of left lower extremity: Secondary | ICD-10-CM

## 2021-12-18 DIAGNOSIS — L570 Actinic keratosis: Secondary | ICD-10-CM | POA: Diagnosis not present

## 2021-12-18 DIAGNOSIS — I2699 Other pulmonary embolism without acute cor pulmonale: Secondary | ICD-10-CM

## 2021-12-18 MED ORDER — CYCLOBENZAPRINE HCL 10 MG PO TABS: 10.0000 mg | ORAL_TABLET | Freq: Three times a day (TID) | ORAL | 0 refills | Status: DC | PRN

## 2021-12-18 MED ORDER — RIVAROXABAN 20 MG PO TABS: 20.0000 mg | ORAL_TABLET | Freq: Every day | ORAL | 5 refills | Status: DC

## 2021-12-18 NOTE — Patient Instructions (Signed)
Actinic Keratosis An actinic keratosis is a precancerous growth on the skin. If there is more than one growth, the condition is called actinic keratoses. Actinic keratoses appear most often on areas of skin that get a lot of sun exposure, including the scalp, face, ears, lips, upper back, forearms, and the backs of the hands. If left untreated, these growths may develop into a skin cancer called squamous cell carcinoma. It is important to have all these growths checked by a health care provider to determine the best treatment approach. What are the causes? Actinic keratoses are caused by getting too much ultraviolet (UV) radiation from the sun or other UV light sources. What increases the risk? You are more likely to develop this condition if you: Have light-colored skin and blue eyes. Have blond or red hair. Spend a lot of time in the sun. Do not protect your skin from the sun when outdoors. Are an older person. The risk of developing an actinic keratosis increases with age. What are the signs or symptoms? Actinic keratoses feel like scaly, rough spots of skin. Symptoms of this condition include growths that may: Be as small as a pinhead or as big as a quarter. Itch, hurt, or feel sensitive. Be skin-colored, light tan, dark tan, pink, or a combination of any of these colors. In most cases, the growths become red. Have a small piece of pink or gray skin (skin tag) growing from them. It may be easier to notice actinic keratoses by feeling them, rather than seeing them. Sometimes, actinic keratoses disappear, but many reappear a few days to a few weeks later. How is this diagnosed? This condition is usually diagnosed with a physical exam. A tissue sample may be removed from the actinic keratosis and examined under a microscope (biopsy). How is this treated? If needed, this condition may be treated by: Scraping off the actinic keratosis (curettage). Freezing the actinic keratosis with liquid  nitrogen (cryosurgery). This causes the growth to eventually fall off the skin. Applying medicated creams or gels to destroy the cells in the growth. Applying chemicals to the actinic keratosis to make the outer layers of skin peel off (chemical peel). Using photodynamic therapy. In this procedure, medicated cream is applied to the actinic keratosis. This cream increases your skin's sensitivity to light. Then, a strong light is aimed at the actinic keratosis to destroy cells in the growth. Follow these instructions at home: Skin care Apply cool, wet cloths (cool compresses) to the affected areas. Do not scratch your skin. Check your skin regularly for any growths, especially growths that: Start to itch or bleed. Change in size, shape, or color. Caring for the treated area Keep the treated area clean and dry as told by your health care provider. Do not apply any medicine, cream, or lotion to the treated area unless your health care provider tells you to do that. Do not pick at blisters or try to break them open. This can cause infection and scarring. If you have red or irritated skin after treatment, follow instructions from your health care provider about how to take care of the treated area. Make sure you: Wash your hands with soap and water before you change your bandage (dressing). If soap and water are not available, use hand sanitizer. Change your dressing as told by your health care provider. If you have red or irritated skin after treatment, check your treated area every day for signs of infection. Check for: Redness, swelling, or pain. Fluid or blood.   Warmth. Pus or a bad smell. General instructions Take or apply over-the-counter and prescription medicines only as told by your health care provider. Return to your normal activities as told by your health care provider. Ask your health care provider what activities are safe for you. Have a skin exam done every year by a health care  provider who is a skin specialist (dermatologist). Keep all follow-up visits as told by your health care provider. This is important. Lifestyle Do not use any products that contain nicotine or tobacco, such as cigarettes and e-cigarettes. If you need help quitting, ask your health care provider. Take steps to protect your skin from the sun. Try to avoid the sun between 10:00 a.m. and 4:00 p.m. This is when the UV light is the strongest. Use a sunscreen or sunblock with SPF 30 (sun protection factor 30) or greater. Apply sunscreen before you are exposed to sunlight and reapply as often as directed by the instructions on the sunscreen container. Always wear sunglasses that have UV protection, and always wear a hat and clothing to protect your skin from sunlight. When possible, avoid medicines that increase your sensitivity to sunlight. Do not use tanning beds or other indoor tanning devices. Contact a health care provider if: You notice any changes or new growths on your skin. You have swelling, pain, or more redness around your treated area. You have fluid or blood coming from your treated area. Your treated area feels warm to the touch. You have pus or a bad smell coming from your treated area. You have a fever. You have a blister that becomes large and painful. Summary An actinic keratosis is a precancerous growth on the skin. If there is more than one growth, the condition is called actinic keratoses. In some cases, if left untreated, these growths can develop into skin cancer. Check your skin regularly for any growths, especially growths that start to itch or bleed, or change in size, shape, or color. Take steps to protect your skin from the sun. Contact a health care provider if you notice any changes or new growths on your skin. Keep all follow-up visits as told by your health care provider. This is important. This information is not intended to replace advice given to you by your  health care provider. Make sure you discuss any questions you have with your health care provider. Document Revised: 05/15/2021 Document Reviewed: 04/25/2021 Elsevier Patient Education  2023 Elsevier Inc.  

## 2021-12-18 NOTE — Progress Notes (Unsigned)
Established Patient Office Visit  Subjective   Patient ID: Marc Lowe, male    DOB: Dec 08, 1950  Age: 71 y.o. MRN: [id number]  Chief Complaint  Patient presents with   Follow-up    HPI Pt is a 71 yo male who presents to the clinic with concern for skin lesion on his nose. He is fair skin and he scratched off this "scaly place" and then it came back. He admits he does not always wear sun screen. He has never had any skin cancers.   He does need his xarelto refilled. He has hx of bilateral PE and chronic left DVT. He was positive for lupus anticoagulant.   He did fall off a ladder just before coming to todays appt. He landed on his right lower back and hip. He is able to walk fine. It is very sore and he is getting stiffer. No radiation of pain down legs. Not taken or done anything to make better.   .. 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   HYPOGONADISM, MALE 11/04/2006   Depression, recurrent ([place]) 11/04/2006   VARICES OF OTHER SITES 10/04/2009   CHRONIC RHINITIS 06/04/2007   PARONYCHIA, FINGER 07/13/2009   CELLULITIS AND ABSCESS OF OTHER SPECIFIED SITE 05/09/2008   ACNE ROSACEA 10/07/2006   SKIN RASH 07/13/2009   DIARRHEA 03/14/2010   ABDOMINAL PAIN, GENERALIZED 03/14/2010   IMPAIRED FASTING GLUCOSE 07/24/2009   ELEVATED PROSTATE SPECIFIC ANTIGEN 04/10/2009   LIBIDO, DECREASED 10/07/2006   History of bowel resection 04/14/2014   Testosterone deficiency 04/14/2014   Hyperlipidemia 04/15/2014   Cervical nerve root impingement 04/11/2015   Left lower quadrant abdominal pain 05/08/2016   History of diverticulitis 05/08/2016   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   Family history of atrial fibrillation 06/11/2016   Left shoulder pain 11/21/2016   Toenail fungus 12/16/2016   No energy 12/17/2016   Elevated TSH 12/18/2016   Anhedonia 05/13/2017   Abnormal weight gain 05/13/2017   Cervical radiculopathy 02/18/2020   DDD  (degenerative disc disease), cervical 02/18/2020   Mixed hyperlipidemia 02/18/2020   Memory changes 02/18/2020   Seborrheic keratoses 02/21/2020   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   Need for zoster vaccination 05/29/2020   Acute respiratory failure with hypoxia ([place]) 12/27/2020   Intercostal pain 12/27/2020   Mid back pain on right side 12/27/2020   SOB (shortness of breath) 12/27/2020   COVID-19 virus infection 12/27/2020   Pulmonary embolus ([place]) 12/27/2020   Bilateral pulmonary embolism ([place]) 12/27/2020   Pulmonary embolism ([place]) 12/28/2020   Acute deep vein thrombosis (DVT) of proximal vein of left lower extremity ([place]) 01/03/2021   History of COVID-19 01/03/2021   Colonic diverticular abscess 01/04/2021   Hypothyroidism 01/04/2021   Chronic left shoulder pain 08/17/2021   Right hip pain 08/17/2021   Age spots 08/17/2021   Localized osteoarthritis of left shoulder 08/20/2021   Osteoarthritis of right hip 08/20/2021   Lumbar spondylosis 10/30/2021   Hernia, ventral 10/30/2021   Acute right-sided low back pain without sciatica 12/18/2021   Fall 12/18/2021   Actinic keratosis 12/18/2021   Lupus anticoagulant positive 12/18/2021   Leg DVT (deep venous thromboembolism), chronic, left ([place]) 12/18/2021   Resolved Ambulatory Problems    Diagnosis Date Noted   HYPERLIPIDEMIA 10/07/2006   VIRAL URI 05/09/2008   Localized swelling of left lower extremity 07/02/2019   Past Medical History:  Diagnosis Date   BPH (benign prostatic hyperplasia)    Depression  Diverticulitis    Hypothyroidism (acquired)    Pulmonary emboli (HCC)       ROS See HPI   Objective:     BP 109/71   Pulse 81   Ht '6\' 2"'$  (1.88 m)   Wt 232 lb (105.2 kg)   SpO2 98%   BMI 29.79 kg/m  BP Readings from Last 3 Encounters:  12/18/21 109/71  11/13/21 129/78  08/28/21 126/85      Physical Exam Vitals reviewed.  Constitutional:      Appearance: Normal appearance.  HENT:      Head: Normocephalic.  Cardiovascular:     Rate and Rhythm: Normal rate and regular rhythm.     Pulses: Normal pulses.     Heart sounds: Normal heart sounds.  Pulmonary:     Effort: Pulmonary effort is normal.     Breath sounds: Normal breath sounds.  Musculoskeletal:     Right lower leg: No edema.     Left lower leg: No edema.     Comments: Lower ext strength 5/5.  No lumbar spinal tenderness Tenderness into right back and buttocks to palpation No pain over right greater trochanter NROM of bilateral hips and waist  Skin:    Comments: Center nasal bridge 45m scaly lesion on erythematous base.  Neurological:     General: No focal deficit present.     Mental Status: He is alert and oriented to person, place, and time.  Psychiatric:        Mood and Affect: Mood normal.   Cryotherapy Procedure Note  Pre-operative Diagnosis: Actinic keratosis  Post-operative Diagnosis: Actinic keratosis  Locations: center nasal bridge of nare  Indications: precancerous  Procedure Details  History of allergy to iodine: no. Pacemaker? no.  Patient informed of risks (permanent scarring, infection, light or dark discoloration, bleeding, infection, weakness, numbness and recurrence of the lesion) and benefits of the procedure and verbal informed consent obtained.  The areas are treated with liquid nitrogen therapy, frozen until ice ball extended 2 mm beyond lesion, allowed to thaw, and treated again. The patient tolerated procedure well.  The patient was instructed on post-op care, warned that there may be blister formation, redness and pain. Recommend OTC analgesia as needed for pain.  Condition: Stable  Complications: none.  Plan: 1. Instructed to keep the area dry and covered for 24-48h and clean thereafter. 2. Warning signs of infection were reviewed.   3. Recommended that the patient use OTC acetaminophen as needed for pain.        Assessment & Plan:  .[name] KitchenMarland KitchenSabriwas seen today for  follow-up.  Diagnoses and all orders for this visit:  Actinic keratosis  Fall, initial encounter -     cyclobenzaprine (FLEXERIL) 10 MG tablet; Take 1 tablet (10 mg total) by mouth 3 (three) times daily as needed for muscle spasms.  Acute right-sided low back pain without sciatica -     cyclobenzaprine (FLEXERIL) 10 MG tablet; Take 1 tablet (10 mg total) by mouth 3 (three) times daily as needed for muscle spasms.  Bilateral pulmonary embolism (HCC) -     rivaroxaban (XARELTO) 20 MG TABS tablet; Take 1 tablet (20 mg total) by mouth daily with supper.  Lupus anticoagulant positive -     rivaroxaban (XARELTO) 20 MG TABS tablet; Take 1 tablet (20 mg total) by mouth daily with supper.  Leg DVT (deep venous thromboembolism), chronic, left (HCC) -     rivaroxaban (XARELTO) 20 MG TABS tablet; Take 1 tablet (20 mg total)  by mouth daily with supper.   Discussed AK and precancerous cells Cryotherapy done today Encouraged to wear sunscreen and hats when outside HO given   Refilled xarelto due to need for long term use  Reassurance after fall of no red flags Encouraged icing No NSAIDs Flexeril given to use as needed, sedation warning given Encouraged patient to stretch [place] of big bruise Follow up as needed or if symptoms change or worsen   Iran Planas, PA-C

## 2022-02-19 ENCOUNTER — Ambulatory Visit (INDEPENDENT_AMBULATORY_CARE_PROVIDER_SITE_OTHER): Payer: No Typology Code available for payment source

## 2022-02-19 ENCOUNTER — Encounter: Payer: Self-pay | Admitting: Physician Assistant

## 2022-02-19 ENCOUNTER — Ambulatory Visit (INDEPENDENT_AMBULATORY_CARE_PROVIDER_SITE_OTHER): Payer: No Typology Code available for payment source | Admitting: Physician Assistant

## 2022-02-19 VITALS — BP 127/76 | HR 84 | Ht 74.0 in | Wt 229.0 lb

## 2022-02-19 DIAGNOSIS — M25551 Pain in right hip: Secondary | ICD-10-CM

## 2022-02-19 DIAGNOSIS — M25552 Pain in left hip: Secondary | ICD-10-CM

## 2022-02-19 DIAGNOSIS — M545 Low back pain, unspecified: Secondary | ICD-10-CM | POA: Diagnosis not present

## 2022-02-19 DIAGNOSIS — I2699 Other pulmonary embolism without acute cor pulmonale: Secondary | ICD-10-CM

## 2022-02-19 DIAGNOSIS — M5137 Other intervertebral disc degeneration, lumbosacral region: Secondary | ICD-10-CM | POA: Diagnosis not present

## 2022-02-19 DIAGNOSIS — M549 Dorsalgia, unspecified: Secondary | ICD-10-CM | POA: Diagnosis not present

## 2022-02-19 NOTE — Patient Instructions (Addendum)
Trial of glucosamine for joint pain  Hip Bursitis Rehab Ask your health care provider which exercises are safe for you. Do exercises exactly as told by your health care provider and adjust them as directed. It is normal to feel mild stretching, pulling, tightness, or discomfort as you do these exercises. Stop right away if you feel sudden pain or your pain gets worse. Do not begin these exercises until told by your health care provider. Stretching exercise This exercise warms up your muscles and joints and improves the movement and flexibility of your hip. This exercise also helps to relieve pain and stiffness. Iliotibial band stretch An iliotibial band is a strong band of muscle tissue that runs from the outer side of your hip to the outer side of your thigh and knee. Lie on your side with your left / right leg in the top position. Bend your left / right knee and grab your ankle. Stretch out your bottom arm to help you balance. Slowly bring your knee back so your thigh is slightly behind your body. Slowly lower your knee toward the floor until you feel a gentle stretch on the outside of your left / right thigh. If you do not feel a stretch and your knee will not lower more toward the floor, place the heel of your other foot on top of your knee and pull your knee down toward the floor with your foot. Hold this position for __________ seconds. Slowly return to the starting position. Repeat __________ times. Complete this exercise __________ times a day. Strengthening exercises These exercises build strength and endurance in your hip and pelvis. Endurance is the ability to use your muscles for a long time, even after they get tired. Bridge This exercise strengthens the muscles that move your thigh backward (hip extensors). Lie on your back on a firm surface with your knees bent and your feet flat on the floor. Tighten your buttocks muscles and lift your buttocks off the floor until your trunk is  level with your thighs. Do not arch your back. You should feel the muscles working in your buttocks and the back of your thighs. If you do not feel these muscles, slide your feet 1-2 inches (2.5-5 cm) farther away from your buttocks. If this exercise is too easy, try doing it with your arms crossed over your chest. Hold this position for __________ seconds. Slowly lower your hips to the starting position. Let your muscles relax completely after each repetition. Repeat __________ times. Complete this exercise __________ times a day. Squats This exercise strengthens the muscles in front of your thigh and knee (quadriceps). Stand in front of a table, with your feet and knees pointing straight ahead. You may rest your hands on the table for balance but not for support. Slowly bend your knees and lower your hips like you are going to sit in a chair. Keep your weight over your heels, not over your toes. Keep your lower legs upright so they are parallel with the table legs. Do not let your hips go lower than your knees. Do not bend lower than told by your health care provider. If your hip pain increases, do not bend as low. Hold the squat position for __________ seconds. Slowly push with your legs to return to standing. Do not use your hands to pull yourself to standing. Repeat __________ times. Complete this exercise __________ times a day. Hip hike  Stand sideways on a bottom step. Stand on your left / right leg  with your other foot unsupported next to the step. You can hold on to the railing or wall for balance if needed. Keep your knees straight and your torso square. Then lift your left / right hip up toward the ceiling. Hold this position for __________ seconds. Slowly let your left / right hip lower toward the floor, past the starting position. Your foot should get closer to the floor. Do not lean or bend your knees. Repeat __________ times. Complete this exercise __________ times a  day. Single leg stand This exercise increases your balance. Without shoes, stand near a railing or in a doorway. You may hold on to the railing or door frame as needed for balance. Squeeze your left / right buttock muscles, then lift up your other foot. Do not let your left / right hip push out to the side. It is helpful to stand in front of a mirror for this exercise so you can watch your hip. Hold this position for __________ seconds. Repeat __________ times. Complete this exercise __________ times a day. This information is not intended to replace advice given to you by your health care provider. Make sure you discuss any questions you have with your health care provider. Document Revised: 06/13/2021 Document Reviewed: 06/13/2021 Elsevier Patient Education  [place].

## 2022-02-19 NOTE — Progress Notes (Signed)
Established Patient Office Visit  Subjective   Patient ID: Marc Lowe, male    DOB: July 06, 1951  Age: 71 y.o. MRN: [id number]  Chief Complaint  Patient presents with   Follow-up    HPI Pt is a 71 yo male with lupus anticoagulant disorder and bilateral pulmonary embolism who presents to the clinic for 6 month follow up.   Pt is doing well. Continues on eliqus. No SOB, leg pain, swelling. He is having some bilateral hip pain for the last few months on and off. Worse when walking and up hill. Resolves with rest. He does golf twice a week. He is also having some mid back and low back pain for 3 months.  He wonders if he can take glucosamine. He did go zip lining a few months ago but no injuries. No radiation of pain down legs. No weakness of legs.   .. 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   HYPOGONADISM, MALE 11/04/2006   Depression, recurrent ([place]) 11/04/2006   VARICES OF OTHER SITES 10/04/2009   CHRONIC RHINITIS 06/04/2007   PARONYCHIA, FINGER 07/13/2009   CELLULITIS AND ABSCESS OF OTHER SPECIFIED SITE 05/09/2008   ACNE ROSACEA 10/07/2006   SKIN RASH 07/13/2009   DIARRHEA 03/14/2010   ABDOMINAL PAIN, GENERALIZED 03/14/2010   IMPAIRED FASTING GLUCOSE 07/24/2009   ELEVATED PROSTATE SPECIFIC ANTIGEN 04/10/2009   LIBIDO, DECREASED 10/07/2006   History of bowel resection 04/14/2014   Testosterone deficiency 04/14/2014   Hyperlipidemia 04/15/2014   Cervical nerve root impingement 04/11/2015   Left lower quadrant abdominal pain 05/08/2016   History of diverticulitis 05/08/2016   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   Family history of atrial fibrillation 06/11/2016   Left shoulder pain 11/21/2016   Toenail fungus 12/16/2016   No energy 12/17/2016   Elevated TSH 12/18/2016   Anhedonia 05/13/2017   Abnormal weight gain 05/13/2017   Cervical radiculopathy 02/18/2020   DDD (degenerative disc disease), cervical 02/18/2020   Mixed  hyperlipidemia 02/18/2020   Memory changes 02/18/2020   Seborrheic keratoses 02/21/2020   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   Need for zoster vaccination 05/29/2020   Acute respiratory failure with hypoxia ([place]) 12/27/2020   Intercostal pain 12/27/2020   Mid back pain on right side 12/27/2020   SOB (shortness of breath) 12/27/2020   COVID-19 virus infection 12/27/2020   Pulmonary embolus ([place]) 12/27/2020   Bilateral pulmonary embolism ([place]) 12/27/2020   Pulmonary embolism ([place]) 12/28/2020   Acute deep vein thrombosis (DVT) of proximal vein of left lower extremity ([place]) 01/03/2021   History of COVID-19 01/03/2021   Colonic diverticular abscess 01/04/2021   Hypothyroidism 01/04/2021   Chronic left shoulder pain 08/17/2021   Right hip pain 08/17/2021   Age spots 08/17/2021   Localized osteoarthritis of left shoulder 08/20/2021   Osteoarthritis of right hip 08/20/2021   Lumbar spondylosis 10/30/2021   Hernia, ventral 10/30/2021   Acute right-sided low back pain without sciatica 12/18/2021   Fall 12/18/2021   Actinic keratosis 12/18/2021   Lupus anticoagulant positive 12/18/2021   Leg DVT (deep venous thromboembolism), chronic, left ([place]) 12/18/2021   Bilateral hip pain 02/19/2022   Acute bilateral low back pain without sciatica 02/19/2022   Mid back pain 02/19/2022   Resolved Ambulatory Problems    Diagnosis Date Noted   HYPERLIPIDEMIA 10/07/2006   VIRAL URI 05/09/2008   Localized swelling of left lower extremity 07/02/2019   Past Medical History:  Diagnosis Date   BPH (benign  prostatic hyperplasia)    Depression    Diverticulitis    Hypothyroidism (acquired)    Pulmonary emboli ([place])      ROS   See HPI.  Objective:     BP 127/76   Pulse 84   Ht '6\' 2"'$  (1.88 m)   Wt 229 lb (103.9 kg)   SpO2 97%   BMI 29.40 kg/m  BP Readings from Last 3 Encounters:  02/19/22 127/76  12/18/21 109/71  11/13/21 129/78   Wt Readings from Last 3 Encounters:   02/19/22 229 lb (103.9 kg)  12/18/21 232 lb (105.2 kg)  11/13/21 231 lb 4 oz (104.9 kg)      Physical Exam Constitutional:      Appearance: Normal appearance.  HENT:     Head: Normocephalic.  Cardiovascular:     Rate and Rhythm: Normal rate.     Pulses: Normal pulses.     Heart sounds: Normal heart sounds.  Pulmonary:     Effort: Pulmonary effort is normal.  Musculoskeletal:     Right lower leg: No edema.     Left lower leg: No edema.     Comments: Tenderness to palpation over bilateral greater trochanters. 5/5 lower ext strength NROM at hips and waist No tenderness over thoracic or lumbar spine Negative SLR, bilaterally  Neurological:     General: No focal deficit present.     Mental Status: He is alert and oriented to person, place, and time.  Psychiatric:        Mood and Affect: Mood normal.          Assessment & Plan:  [name] KitchenMarland KitchenMaylon was seen today for follow-up.  Diagnoses and all orders for this visit:  Bilateral hip pain -     DG Lumbar Spine Complete; Future -     DG HIPS BILAT WITH PELVIS 2V; Future  Mid back pain -     DG Thoracic Spine W/Swimmers; Future  Acute bilateral low back pain without sciatica -     DG Lumbar Spine Complete; Future -     DG HIPS BILAT WITH PELVIS 2V; Future  Bilateral pulmonary embolism (HCC)   Continue on eliquis No refills needed Suspect bursitis for hip pain Declined injection Exercises given No NSAIDS due to eliquis Ok for glucosamine trial Ice as needed Xrays ordered Declines shingles vaccine Follow up in 6 months   Iran Planas, PA-C

## 2022-02-20 ENCOUNTER — Encounter: Payer: Self-pay | Admitting: Physician Assistant

## 2022-02-20 DIAGNOSIS — M5136 Other intervertebral disc degeneration, lumbar region: Secondary | ICD-10-CM | POA: Insufficient documentation

## 2022-02-20 DIAGNOSIS — M160 Bilateral primary osteoarthritis of hip: Secondary | ICD-10-CM | POA: Insufficient documentation

## 2022-02-20 NOTE — Progress Notes (Signed)
Lumbar spine shows a lot of aging(degenerative) changes.  You have some 59m posterior slipage of disc at L3/L4 and some disc space narrowing.  Good posture and working on core strength can help. Use good lifting technique not to injure it any more. There are medications that can help with chronic pain from this. At times injections into spine can help. Suggest to discuss with Dr. TDarene Lamerin office to manage due to his specialty is sports medicine.

## 2022-02-20 NOTE — Progress Notes (Signed)
Degenerative changes of mid back.

## 2022-02-20 NOTE — Progress Notes (Signed)
You also have bilateral hip arthritis as well. Trial of glucosamine and exercises consider Dr. Darene Lamer for injections if pain is worsening.

## 2022-03-19 ENCOUNTER — Other Ambulatory Visit: Payer: Self-pay

## 2022-03-19 DIAGNOSIS — I824Y2 Acute embolism and thrombosis of unspecified deep veins of left proximal lower extremity: Secondary | ICD-10-CM

## 2022-03-20 ENCOUNTER — Ambulatory Visit (HOSPITAL_BASED_OUTPATIENT_CLINIC_OR_DEPARTMENT_OTHER)
Admission: RE | Admit: 2022-03-20 | Discharge: 2022-03-20 | Disposition: A | Discharge status: Home / Self Care | Payer: No Typology Code available for payment source | Source: Ambulatory Visit | Attending: Hematology & Oncology | Admitting: Hematology & Oncology

## 2022-03-20 ENCOUNTER — Encounter: Payer: Self-pay | Admitting: Hematology & Oncology

## 2022-03-20 ENCOUNTER — Other Ambulatory Visit: Payer: Self-pay

## 2022-03-20 ENCOUNTER — Inpatient Hospital Stay: Payer: No Typology Code available for payment source | Attending: Hematology & Oncology

## 2022-03-20 ENCOUNTER — Inpatient Hospital Stay (HOSPITAL_BASED_OUTPATIENT_CLINIC_OR_DEPARTMENT_OTHER): Payer: No Typology Code available for payment source | Admitting: Hematology & Oncology

## 2022-03-20 VITALS — BP 128/77 | HR 64 | Temp 97.8°F | Resp 18 | Ht 74.0 in | Wt 231.0 lb

## 2022-03-20 DIAGNOSIS — D6862 Lupus anticoagulant syndrome: Secondary | ICD-10-CM | POA: Diagnosis not present

## 2022-03-20 DIAGNOSIS — I824Y2 Acute embolism and thrombosis of unspecified deep veins of left proximal lower extremity: Secondary | ICD-10-CM

## 2022-03-20 DIAGNOSIS — Z7901 Long term (current) use of anticoagulants: Secondary | ICD-10-CM | POA: Diagnosis not present

## 2022-03-20 DIAGNOSIS — I82402 Acute embolism and thrombosis of unspecified deep veins of left lower extremity: Secondary | ICD-10-CM | POA: Insufficient documentation

## 2022-03-20 DIAGNOSIS — I82502 Chronic embolism and thrombosis of unspecified deep veins of left lower extremity: Secondary | ICD-10-CM | POA: Diagnosis not present

## 2022-03-20 DIAGNOSIS — I2699 Other pulmonary embolism without acute cor pulmonale: Secondary | ICD-10-CM | POA: Insufficient documentation

## 2022-03-20 DIAGNOSIS — Z7982 Long term (current) use of aspirin: Secondary | ICD-10-CM | POA: Insufficient documentation

## 2022-03-20 LAB — CMP (CANCER CENTER ONLY)
ALT: 23 U/L (ref 0–44)
AST: 25 U/L (ref 15–41)
Albumin: 4.2 g/dL (ref 3.5–5.0)
Alkaline Phosphatase: 55 U/L (ref 38–126)
Anion gap: 5 (ref 5–15)
BUN: 20 mg/dL (ref 8–23)
CO2: 30 mmol/L (ref 22–32)
Calcium: 9.7 mg/dL (ref 8.9–10.3)
Chloride: 104 mmol/L (ref 98–111)
Creatinine: 1.01 mg/dL (ref 0.61–1.24)
GFR, Estimated: >60 mL/min (ref >60)
Glucose, Bld: 104 mg/dL — ABNORMAL HIGH (ref 70–99)
Potassium: 4.5 mmol/L (ref 3.5–5.1)
Sodium: 139 mmol/L (ref 135–145)
Total Bilirubin: 0.5 mg/dL (ref 0.3–1.2)
Total Protein: 7 g/dL (ref 6.5–8.1)

## 2022-03-20 LAB — CBC WITH DIFFERENTIAL (CANCER CENTER ONLY)
Abs Immature Granulocytes: 0.02 10*3/uL (ref 0.00–0.07)
Basophils Absolute: 0 10*3/uL (ref 0.0–0.1)
Basophils Relative: 1 %
Eosinophils Absolute: 0.1 10*3/uL (ref 0.0–0.5)
Eosinophils Relative: 3 %
HCT: 46.1 % (ref 39.0–52.0)
Hemoglobin: 15.3 g/dL (ref 13.0–17.0)
Immature Granulocytes: 0 %
Lymphocytes Relative: 26 %
Lymphs Abs: 1.2 10*3/uL (ref 0.7–4.0)
MCH: 29.2 pg (ref 26.0–34.0)
MCHC: 33.2 g/dL (ref 30.0–36.0)
MCV: 88 fL (ref 80.0–100.0)
Monocytes Absolute: 0.5 10*3/uL (ref 0.1–1.0)
Monocytes Relative: 11 %
Neutro Abs: 2.7 10*3/uL (ref 1.7–7.7)
Neutrophils Relative %: 59 %
Platelet Count: 203 10*3/uL (ref 150–400)
RBC: 5.24 MIL/uL (ref 4.22–5.81)
RDW: 13.2 % (ref 11.5–15.5)
WBC Count: 4.6 10*3/uL (ref 4.0–10.5)
nRBC: 0 % (ref 0.0–0.2)

## 2022-03-20 LAB — D-DIMER, QUANTITATIVE: D-Dimer, Quant: <0.27 ug/mL-FEU (ref 0.00–0.50)

## 2022-03-20 NOTE — Progress Notes (Signed)
Hematology and Oncology Follow Up Visit  Marc Lowe [id number] [birth year] 71 y.o. 03/20/2022   Principle Diagnosis:  Bilateral pulmonary emboli/left lower extremity DVT --(+) lupus anticoagulant  Current Therapy:   Xarelto 20 mg p.o. daily-started 12/2020 EC ASA 81 mg po q day -- start on 02/13/2022     Interim History:  Marc Lowe is back for follow-up.  He has had a good summer.  He is busy doing remodeling.  He does all himself.  Very impressed by this.  He is still playing golf twice a week.  I told to make sure that he stays well-hydrated.  We did do a Doppler of his leg today.  This showed no acute thrombus.  He did have the chronic thrombus that was noted in the left central profunda in his right gastrocnemius vein.  It is nonocclusive.  He started the aspirin probably about a month ago.  He is doing well with the aspirin.  He has had no cough or shortness of breath.  He has had no nausea or vomiting.  He has had no change in bowel or bladder habits.  There is been no leg swelling.  Has had no leg pain.  Overall, his performance status is ECOG 0.    Medications:  Current Outpatient Medications:    Boswellia-Glucosamine-Vit D (OSTEO BI-FLEX ONE PER DAY PO), Take 2 tablets by mouth daily., Disp: , Rfl:    aspirin EC 81 MG tablet, Take 81 mg by mouth daily. Swallow whole., Disp: , Rfl:    levothyroxine (EUTHYROX) 25 MCG tablet, Take 1 tablet (25 mcg total) by mouth daily before breakfast., Disp: 90 tablet, Rfl: 3   rivaroxaban (XARELTO) 20 MG TABS tablet, Take 1 tablet (20 mg total) by mouth daily with supper., Disp: 30 tablet, Rfl: 5   Sildenafil Citrate (VIAGRA PO), Take by mouth., Disp: , Rfl:    tamsulosin (FLOMAX) 0.4 MG CAPS capsule, Take 0.4 mg by mouth at bedtime., Disp: , Rfl:    Vilazodone HCl (VIIBRYD) 20 MG TABS, Take 1 tablet (20 mg total) by mouth daily., Disp: 90 tablet, Rfl: 3  Allergies:  No Known Allergies   Past Medical History, Surgical history,  Social history, and Family History were reviewed and updated.  Review of Systems: Review of Systems  Constitutional: Negative.   HENT:  Negative.    Eyes: Negative.   Respiratory: Negative.    Cardiovascular: Negative.   Gastrointestinal: Negative.   Endocrine: Negative.   Genitourinary: Negative.    Musculoskeletal: Negative.   Skin: Negative.   Neurological: Negative.   Hematological: Negative.   Psychiatric/Behavioral: Negative.      Physical Exam:  height is '6\' 2"'$  (1.88 m) and weight is 231 lb (104.8 kg). His oral temperature is 97.8 F (36.6 C). His blood pressure is 128/77 and his pulse is 64. His respiration is 18 and oxygen saturation is 96%.   Wt Readings from Last 3 Encounters:  03/20/22 231 lb (104.8 kg)  02/19/22 229 lb (103.9 kg)  12/18/21 232 lb (105.2 kg)    Physical Exam Vitals reviewed.  HENT:     Head: Normocephalic and atraumatic.  Eyes:     Pupils: Pupils are equal, round, and reactive to light.  Cardiovascular:     Rate and Rhythm: Normal rate and regular rhythm.     Heart sounds: Normal heart sounds.  Pulmonary:     Effort: Pulmonary effort is normal.     Breath sounds: Normal breath sounds.  Abdominal:  General: Bowel sounds are normal.     Palpations: Abdomen is soft.  Musculoskeletal:        General: No tenderness or deformity. Normal range of motion.     Cervical back: Normal range of motion.  Lymphadenopathy:     Cervical: No cervical adenopathy.  Skin:    General: Skin is warm and dry.     Findings: No erythema or rash.  Neurological:     Mental Status: He is alert and oriented to person, place, and time.  Psychiatric:        Behavior: Behavior normal.        Thought Content: Thought content normal.        Judgment: Judgment normal.      Lab Results  Component Value Date   WBC 4.6 03/20/2022   HGB 15.3 03/20/2022   HCT 46.1 03/20/2022   MCV 88.0 03/20/2022   PLT 203 03/20/2022     Chemistry      Component Value  Date/Time   NA 139 03/20/2022 0827   K 4.5 03/20/2022 0827   CL 104 03/20/2022 0827   CO2 30 03/20/2022 0827   BUN 20 03/20/2022 0827   CREATININE 1.01 03/20/2022 0827   CREATININE 1.06 08/17/2021 0000      Component Value Date/Time   CALCIUM 9.7 03/20/2022 0827   ALKPHOS 55 03/20/2022 0827   AST 25 03/20/2022 0827   ALT 23 03/20/2022 0827   BILITOT 0.5 03/20/2022 0827      Impression and Plan: Marc Lowe is a very nice 71 year old white male.  He is originally from [place].   He had a pulmonary embolus.  He had a left lower extremity DVT.  Again, he has a positive lupus anticoagulant.  I have to believe that this is clearly the most likely culprit for his thromboembolic disease.  I think is going need lifelong anticoagulation.  He is on Xarelto.  He is also on aspirin now.  This point, we can now get him through the holidays.  I will see him back in January.  I do not think we have to do another Doppler unless there are any changes with his leg or weight if he has any issues with shortness of breath, cough, chest wall pain.    Volanda Napoleon, MD 9/6/20239:20 AM

## 2022-03-21 LAB — DRVVT CONFIRM: dRVVT Confirm: 1.6 ratio — ABNORMAL HIGH (ref 0.8–1.2)

## 2022-03-21 LAB — LUPUS ANTICOAGULANT PANEL
DRVVT: 89.2 s — ABNORMAL HIGH (ref 0.0–47.0)
PTT Lupus Anticoagulant: 47.2 s — ABNORMAL HIGH (ref 0.0–43.5)

## 2022-03-21 LAB — HEXAGONAL PHASE PHOSPHOLIPID: Hexagonal Phase Phospholipid: 7 s (ref 0–11)

## 2022-03-21 LAB — CARDIOLIPIN ANTIBODIES, IGG, IGM, IGA
Anticardiolipin IgA: <9 APL U/mL (ref 0–11)
Anticardiolipin IgG: <9 GPL U/mL (ref 0–14)
Anticardiolipin IgM: <9 MPL U/mL (ref 0–12)

## 2022-03-21 LAB — PTT-LA MIX: PTT-LA Mix: 42 s — ABNORMAL HIGH (ref 0.0–40.5)

## 2022-03-21 LAB — DRVVT MIX: dRVVT Mix: 59.9 s — ABNORMAL HIGH (ref 0.0–40.4)

## 2022-03-22 LAB — BETA-2-GLYCOPROTEIN I ABS, IGG/M/A
Beta-2 Glyco I IgG: <9 GPI IgG units (ref 0–20)
Beta-2-Glycoprotein I IgA: <9 GPI IgA units (ref 0–25)
Beta-2-Glycoprotein I IgM: <9 GPI IgM units (ref 0–32)

## 2022-04-14 ENCOUNTER — Ambulatory Visit
Admission: EM | Admit: 2022-04-14 | Discharge: 2022-04-14 | Disposition: A | Discharge status: Home / Self Care | Payer: No Typology Code available for payment source

## 2022-04-14 DIAGNOSIS — T148XXA Other injury of unspecified body region, initial encounter: Secondary | ICD-10-CM

## 2022-04-14 DIAGNOSIS — M25572 Pain in left ankle and joints of left foot: Secondary | ICD-10-CM

## 2022-04-14 NOTE — ED Triage Notes (Signed)
Patient presents to [facility] for ankle injury since Friday. Was icing it and today a log hit his ankle. Is on a blood thinner her for eval. Bruised and swelling noted on right ankle.

## 2022-04-14 NOTE — ED Provider Notes (Signed)
Marc Lowe CARE    CSN: [id number] Arrival date & time: 04/14/22  1551      History   Chief Complaint Chief Complaint  Patient presents with   Ankle Pain    HPI Marc Lowe is a 71 y.o. male.   HPI  Patient is here for ankle pain.  He had an injury to the front of his left ankle yesterday .  no pain with ambulation.  Full ankle motion.  He has a superficial abrasion and some swelling and bruising  Past Medical History:  Diagnosis Date   BPH (benign prostatic hyperplasia)    Depression    Diverticulitis    Hypothyroidism (acquired)    Pulmonary emboli ([place])     Patient Active Problem List   Diagnosis Date Noted   DDD (degenerative disc disease), lumbar 02/20/2022   Bilateral hip joint arthritis 02/20/2022   Bilateral hip pain 02/19/2022   Acute bilateral low back pain without sciatica 02/19/2022   Mid back pain 02/19/2022   Acute right-sided low back pain without sciatica 12/18/2021   Fall 12/18/2021   Actinic keratosis 12/18/2021   Lupus anticoagulant positive 12/18/2021   Leg DVT (deep venous thromboembolism), chronic, left (HCC) 12/18/2021   Lumbar spondylosis 10/30/2021   Hernia, ventral 10/30/2021   Localized osteoarthritis of left shoulder 08/20/2021   Osteoarthritis of right hip 08/20/2021   Chronic left shoulder pain 08/17/2021   Right hip pain 08/17/2021   Age spots 08/17/2021   Colonic diverticular abscess 01/04/2021   Hypothyroidism 01/04/2021   Acute deep vein thrombosis (DVT) of proximal vein of left lower extremity ([place]) 01/03/2021   History of COVID-19 01/03/2021   Acute respiratory failure with hypoxia ([place]) 12/27/2020   Bilateral pulmonary embolism ([place]) 12/27/2020   Need for zoster vaccination 05/29/2020   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   Seborrheic keratoses 02/21/2020   Cervical radiculopathy 02/18/2020   DDD (degenerative disc disease), cervical 02/18/2020   Memory changes 02/18/2020   Anhedonia  05/13/2017   Abnormal weight gain 05/13/2017   Elevated TSH 12/18/2016   No energy 12/17/2016   Toenail fungus 12/16/2016   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   History of diverticulitis 05/08/2016   Cervical nerve root impingement 04/11/2015   Hyperlipidemia 04/15/2014   History of bowel resection 04/14/2014   VARICES OF OTHER SITES 10/04/2009   IMPAIRED FASTING GLUCOSE 07/24/2009   ELEVATED PROSTATE SPECIFIC ANTIGEN 04/10/2009   CHRONIC RHINITIS 06/04/2007   CONDYLOMA ACUMINATA 03/26/2007   HYPOGONADISM, MALE 11/04/2006   Depression, recurrent ([place]) 11/04/2006   ACNE ROSACEA 10/07/2006   LIBIDO, DECREASED 10/07/2006    Past Surgical History:  Procedure Laterality Date   COLON RESECTION SIGMOID     2005   history of colostomy  2005   s/p takedown with mesh   IR RADIOLOGIST EVAL & MGMT  01/17/2021       Home Medications    Prior to Admission medications   Medication Sig Start Date End Date Taking? Authorizing Provider  aspirin EC 81 MG tablet Take 81 mg by mouth daily. Swallow whole.    [provider]  Boswellia-Glucosamine-Vit D (OSTEO BI-FLEX ONE PER DAY PO) Take 2 tablets by mouth daily.    [provider]  levothyroxine (EUTHYROX) 25 MCG tablet Take 1 tablet (25 mcg total) by mouth daily before breakfast. 08/17/21   Breeback, Jade L, PA-C  rivaroxaban (XARELTO) 20 MG TABS tablet Take 1 tablet (20 mg total) by mouth daily with supper. 12/18/21   Breeback,  Jade L, PA-C  Sildenafil Citrate (VIAGRA PO) Take by mouth.    [provider]  tamsulosin (FLOMAX) 0.4 MG CAPS capsule Take 0.4 mg by mouth at bedtime. 11/18/20   [provider]  Vilazodone HCl (VIIBRYD) 20 MG TABS Take 1 tablet (20 mg total) by mouth daily. 08/17/21   Donella Stade, PA-C    Family History Family History  Problem Relation Age of Onset   Atrial fibrillation Father    Leukemia Sister 25       CML    Social History Social History   Tobacco Use   Smoking  status: Never   Smokeless tobacco: Never  Vaping Use   Vaping Use: Never used  Substance Use Topics   Alcohol use: Yes    Alcohol/week: 7.0 standard drinks of alcohol    Types: 7 Glasses of wine per week    Comment: 5 oz   Drug use: No     Allergies   Patient has no known allergies.   Review of Systems Review of Systems See HPI  Physical Exam Triage Vital Signs ED Triage Vitals [04/14/22 1558]  Enc Vitals Group     BP 126/81     Pulse Rate 83     Resp 16     Temp 98.3 F (36.8 C)     Temp Source Oral     SpO2 96 %     Weight      Height      Head Circumference      Peak Flow      Pain Score 0     Pain Loc      Pain Edu?      Excl. in [place]?    No data found.  Updated Vital Signs BP 126/81 (BP Location: Left Arm)   Pulse 83   Temp 98.3 F (36.8 C) (Oral)   Resp 16   SpO2 96%      Physical Exam Constitutional:      General: He is not in acute distress.    Appearance: He is well-developed.  HENT:     Head: Normocephalic and atraumatic.  Eyes:     Conjunctiva/sclera: Conjunctivae normal.     Pupils: Pupils are equal, round, and reactive to light.  Cardiovascular:     Rate and Rhythm: Normal rate.  Pulmonary:     Effort: Pulmonary effort is normal. No respiratory distress.  Abdominal:     General: There is no distension.     Palpations: Abdomen is soft.  Musculoskeletal:        General: Swelling, tenderness and signs of injury present. Normal range of motion.     Cervical back: Normal range of motion.     Comments: On the front of the left ankle toward the medial aspect there is a 3 cm hematoma, fluctuant.  Overlying there is some ecchymosis and superficial abrasion.  No bleeding.  Area is cleansed, treated with bacitracin and a Band-Aid placed.  Wound care is discussed  Skin:    General: Skin is warm and dry.  Neurological:     Mental Status: He is alert.     Gait: Gait normal.      UC Treatments / Results  Labs (all labs ordered are listed,  but only abnormal results are displayed) Labs Reviewed - No data to display  EKG   Radiology No results found.  Procedures Procedures (including critical care time)  Medications Ordered in UC Medications - No data to display  Initial Impression / Assessment and Plan / UC Course  I have reviewed the triage vital signs and the nursing notes.  Pertinent labs & imaging results that were available during my care of the patient were reviewed by me and considered in my medical decision making (see chart for details).     Reviewed that this will heal without difficulty as long as it does not become infected.  Is advised to call or return promptly for any sign of infection Final Clinical Impressions(s) / UC Diagnoses   Final diagnoses:  Acute left ankle pain  Hematoma     Discharge Instructions      Use ice to reduce pain and swelling May take Tylenol if needed for pain Watch area for signs of infection.  Contact your physician if you develop increased pain, swelling, redness, or pus This should heal without additional treatment   ED Prescriptions   None    PDMP not reviewed this encounter.   Raylene Everts, MD 04/14/22 [phone]

## 2022-04-14 NOTE — Discharge Instructions (Signed)
Use ice to reduce pain and swelling May take Tylenol if needed for pain Watch area for signs of infection.  Contact your physician if you develop increased pain, swelling, redness, or pus This should heal without additional treatment

## 2022-04-19 DIAGNOSIS — R972 Elevated prostate specific antigen [PSA]: Secondary | ICD-10-CM | POA: Diagnosis not present

## 2022-04-25 DIAGNOSIS — N400 Enlarged prostate without lower urinary tract symptoms: Secondary | ICD-10-CM | POA: Diagnosis not present

## 2022-04-25 DIAGNOSIS — E291 Testicular hypofunction: Secondary | ICD-10-CM | POA: Diagnosis not present

## 2022-04-30 DIAGNOSIS — E291 Testicular hypofunction: Secondary | ICD-10-CM | POA: Diagnosis not present

## 2022-06-03 ENCOUNTER — Encounter: Payer: Self-pay | Admitting: Physician Assistant

## 2022-06-03 DIAGNOSIS — I2699 Other pulmonary embolism without acute cor pulmonale: Secondary | ICD-10-CM

## 2022-06-03 DIAGNOSIS — I82502 Chronic embolism and thrombosis of unspecified deep veins of left lower extremity: Secondary | ICD-10-CM

## 2022-06-03 DIAGNOSIS — R760 Raised antibody titer: Secondary | ICD-10-CM

## 2022-06-03 MED ORDER — RIVAROXABAN 20 MG PO TABS: 20.0000 mg | ORAL_TABLET | Freq: Every day | ORAL | 1 refills | Status: DC

## 2022-07-19 ENCOUNTER — Ambulatory Visit: Payer: No Typology Code available for payment source

## 2022-07-19 ENCOUNTER — Inpatient Hospital Stay: Payer: No Typology Code available for payment source

## 2022-07-19 ENCOUNTER — Ambulatory Visit: Payer: No Typology Code available for payment source | Admitting: Hematology & Oncology

## 2022-07-19 ENCOUNTER — Ambulatory Visit
Admission: EM | Admit: 2022-07-19 | Discharge: 2022-07-19 | Disposition: A | Discharge status: Home / Self Care | Payer: No Typology Code available for payment source | Attending: Family Medicine | Admitting: Family Medicine

## 2022-07-19 DIAGNOSIS — M19071 Primary osteoarthritis, right ankle and foot: Secondary | ICD-10-CM

## 2022-07-19 DIAGNOSIS — M25571 Pain in right ankle and joints of right foot: Secondary | ICD-10-CM | POA: Diagnosis not present

## 2022-07-19 NOTE — ED Provider Notes (Signed)
[facility]    CSN: [id number] Arrival date & time: 07/19/22  1116      History   Chief Complaint Chief Complaint  Patient presents with   Ankle Pain    RT    HPI BO TEICHER is a 72 y.o. male.   Patient complains of onset of intermittent positional right ankle pain yesterday.  He denies injury, change in shoes, or recent injury but admits that he plays golf regularly.  He denies swelling and history of gout.  The history is provided by the patient.  Ankle Pain Location:  Ankle Time since incident:  1 day Injury: no   Ankle location:  R ankle Pain details:    Quality:  Aching   Radiates to:  Does not radiate   Severity:  Mild   Onset quality:  Sudden   Duration:  1 day   Timing:  Intermittent   Progression:  Unchanged Chronicity:  New Prior injury to area:  No Relieved by:  None tried Worsened by:  Activity Ineffective treatments:  None tried Associated symptoms: no decreased ROM, no stiffness and no swelling     Past Medical History:  Diagnosis Date   BPH (benign prostatic hyperplasia)    Depression    Diverticulitis    Hypothyroidism (acquired)    Pulmonary emboli (HCC)     Patient Active Problem List   Diagnosis Date Noted   DDD (degenerative disc disease), lumbar 02/20/2022   Bilateral hip joint arthritis 02/20/2022   Bilateral hip pain 02/19/2022   Acute bilateral low back pain without sciatica 02/19/2022   Mid back pain 02/19/2022   Acute right-sided low back pain without sciatica 12/18/2021   Fall 12/18/2021   Actinic keratosis 12/18/2021   Lupus anticoagulant positive 12/18/2021   Leg DVT (deep venous thromboembolism), chronic, left (HCC) 12/18/2021   Lumbar spondylosis 10/30/2021   Hernia, ventral 10/30/2021   Localized osteoarthritis of left shoulder 08/20/2021   Osteoarthritis of right hip 08/20/2021   Chronic left shoulder pain 08/17/2021   Right hip pain 08/17/2021   Age spots 08/17/2021   Colonic diverticular  abscess 01/04/2021   Hypothyroidism 01/04/2021   Acute deep vein thrombosis (DVT) of proximal vein of left lower extremity ([place]) 01/03/2021   History of COVID-19 01/03/2021   Acute respiratory failure with hypoxia ([place]) 12/27/2020   Bilateral pulmonary embolism ([place]) 12/27/2020   Need for zoster vaccination 05/29/2020   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   Seborrheic keratoses 02/21/2020   Cervical radiculopathy 02/18/2020   DDD (degenerative disc disease), cervical 02/18/2020   Memory changes 02/18/2020   Anhedonia 05/13/2017   Abnormal weight gain 05/13/2017   Elevated TSH 12/18/2016   No energy 12/17/2016   Toenail fungus 12/16/2016   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   History of diverticulitis 05/08/2016   Cervical nerve root impingement 04/11/2015   Hyperlipidemia 04/15/2014   History of bowel resection 04/14/2014   VARICES OF OTHER SITES 10/04/2009   IMPAIRED FASTING GLUCOSE 07/24/2009   ELEVATED PROSTATE SPECIFIC ANTIGEN 04/10/2009   CHRONIC RHINITIS 06/04/2007   CONDYLOMA ACUMINATA 03/26/2007   HYPOGONADISM, MALE 11/04/2006   Depression, recurrent ([place]) 11/04/2006   ACNE ROSACEA 10/07/2006   LIBIDO, DECREASED 10/07/2006    Past Surgical History:  Procedure Laterality Date   COLON RESECTION SIGMOID     2005   history of colostomy  2005   s/p takedown with mesh   IR RADIOLOGIST EVAL & MGMT  01/17/2021       Home  Medications    Prior to Admission medications   Medication Sig Start Date End Date Taking? Authorizing Provider  aspirin EC 81 MG tablet Take 81 mg by mouth daily. Swallow whole.    [provider]  Boswellia-Glucosamine-Vit D (OSTEO BI-FLEX ONE PER DAY PO) Take 2 tablets by mouth daily.    [provider]  levothyroxine (EUTHYROX) 25 MCG tablet Take 1 tablet (25 mcg total) by mouth daily before breakfast. 08/17/21   Breeback, Jade L, PA-C  rivaroxaban (XARELTO) 20 MG TABS tablet Take 1 tablet (20 mg total) by mouth  daily with supper. 06/03/22   Breeback, Royetta Car, PA-C  Sildenafil Citrate (VIAGRA PO) Take by mouth.    [provider]  tamsulosin (FLOMAX) 0.4 MG CAPS capsule Take 0.4 mg by mouth at bedtime. 11/18/20   [provider]  Vilazodone HCl (VIIBRYD) 20 MG TABS Take 1 tablet (20 mg total) by mouth daily. 08/17/21   Donella Stade, PA-C    Family History Family History  Problem Relation Age of Onset   Atrial fibrillation Father    Leukemia Sister 68       CML    Social History Social History   Tobacco Use   Smoking status: Never   Smokeless tobacco: Never  Vaping Use   Vaping Use: Never used  Substance Use Topics   Alcohol use: Yes    Alcohol/week: 7.0 standard drinks of alcohol    Types: 7 Glasses of wine per week    Comment: 5 oz   Drug use: No     Allergies   Patient has no known allergies.   Review of Systems Review of Systems  Musculoskeletal:  Negative for joint swelling and stiffness.       Right ankle pain  Skin:  Negative for rash.  All other systems reviewed and are negative.    Physical Exam Triage Vital Signs ED Triage Vitals  Enc Vitals Group     BP 07/19/22 1252 120/73     Pulse Rate 07/19/22 1252 71     Resp 07/19/22 1252 17     Temp 07/19/22 1252 98 F (36.7 C)     Temp Source 07/19/22 1252 Oral     SpO2 07/19/22 1252 96 %     Weight --      Height --      Head Circumference --      Peak Flow --      Pain Score 07/19/22 1254 1     Pain Loc --      Pain Edu? --      Excl. in [place]? --    No data found.  Updated Vital Signs BP 120/73 (BP Location: Right Arm)   Pulse 71   Temp 98 F (36.7 C) (Oral)   Resp 17   SpO2 96%   Visual Acuity Right Eye Distance:   Left Eye Distance:   Bilateral Distance:    Right Eye Near:   Left Eye Near:    Bilateral Near:     Physical Exam Vitals and nursing note reviewed.  Constitutional:      General: He is not in acute distress. HENT:     Head: Normocephalic.  Eyes:      Pupils: Pupils are equal, round, and reactive to light.  Cardiovascular:     Rate and Rhythm: Normal rate.  Pulmonary:     Effort: Pulmonary effort is normal.  Musculoskeletal:     Right ankle: No swelling or  ecchymosis. Tenderness present over the medial malleolus. No lateral malleolus, base of 5th metatarsal or proximal fibula tenderness. Normal range of motion. Anterior drawer test negative.     Right Achilles Tendon: Normal.       Feet:     Comments: Right ankle has very mild tenderness to palpation inferior to the medial malleolus.  Skin:    General: Skin is warm and dry.     Findings: No rash.  Neurological:     Mental Status: He is alert.      UC Treatments / Results  Labs (all labs ordered are listed, but only abnormal results are displayed) Labs Reviewed - No data to display  EKG   Radiology DG Ankle Complete Right  Result Date: 07/19/2022 CLINICAL DATA:  Right ankle pain starting yesterday. No known trauma. Medial pain. EXAM: RIGHT ANKLE - COMPLETE 3+ VIEW COMPARISON:  None Available. FINDINGS: Normal bone mineralization. The ankle mortise is symmetric and intact. Minimal distal medial malleolar degenerative spurring. No acute fracture is seen. No dislocation. IMPRESSION: Minimal distal medial malleolar degenerative spurring. No acute fracture. Electronically Signed   By: Yvonne Kendall M.D.   On: 07/19/2022 14:22    Procedures Procedures (including critical care time)  Medications Ordered in UC Medications - No data to display  Initial Impression / Assessment and Plan / UC Course  I have reviewed the triage vital signs and the nursing notes.  Pertinent labs & imaging results that were available during my care of the patient were reviewed by me and considered in my medical decision making (see chart for details).     Suspect osteoarthritis. Followup with Dr. Aundria Mems ([facility]) if not improving about two weeks.  Final Clinical  Impressions(s) / UC Diagnoses   Final diagnoses:  Acute right ankle pain  Primary osteoarthritis of right ankle     Discharge Instructions      Apply ice pack for 20 to 30 minutes, 2 to 3 times daily  Continue until pain decreases.  May take Tylenol if needed for pain.  Try wearing compression stocking on right lower leg.  Decrease walking until pain improves.    ED Prescriptions   None       Kandra Nicolas, MD 07/20/22 2255

## 2022-07-19 NOTE — Discharge Instructions (Signed)
Apply ice pack for 20 to 30 minutes, 2 to 3 times daily  Continue until pain decreases.  May take Tylenol if needed for pain.  Try wearing compression stocking on right lower leg.  Decrease walking until pain improves.

## 2022-07-19 NOTE — ED Triage Notes (Signed)
Pt c/o RT ankle pain since yesterday late morning. Denies injury. No hx of gout. Pain has somewhat subsided but still can't feel a soreness when he rotates his ankle.

## 2022-07-24 ENCOUNTER — Ambulatory Visit (INDEPENDENT_AMBULATORY_CARE_PROVIDER_SITE_OTHER): Payer: No Typology Code available for payment source | Admitting: Physician Assistant

## 2022-07-24 ENCOUNTER — Encounter: Payer: Self-pay | Admitting: Physician Assistant

## 2022-07-24 VITALS — BP 127/88 | HR 69 | Ht 74.0 in | Wt 230.0 lb

## 2022-07-24 DIAGNOSIS — N400 Enlarged prostate without lower urinary tract symptoms: Secondary | ICD-10-CM | POA: Diagnosis not present

## 2022-07-24 DIAGNOSIS — M25571 Pain in right ankle and joints of right foot: Secondary | ICD-10-CM | POA: Diagnosis not present

## 2022-07-24 DIAGNOSIS — F339 Major depressive disorder, recurrent, unspecified: Secondary | ICD-10-CM

## 2022-07-24 DIAGNOSIS — I82502 Chronic embolism and thrombosis of unspecified deep veins of left lower extremity: Secondary | ICD-10-CM | POA: Diagnosis not present

## 2022-07-24 DIAGNOSIS — R0981 Nasal congestion: Secondary | ICD-10-CM

## 2022-07-24 DIAGNOSIS — Z79899 Other long term (current) drug therapy: Secondary | ICD-10-CM

## 2022-07-24 DIAGNOSIS — R760 Raised antibody titer: Secondary | ICD-10-CM

## 2022-07-24 DIAGNOSIS — I2699 Other pulmonary embolism without acute cor pulmonale: Secondary | ICD-10-CM | POA: Diagnosis not present

## 2022-07-24 DIAGNOSIS — E039 Hypothyroidism, unspecified: Secondary | ICD-10-CM | POA: Diagnosis not present

## 2022-07-24 DIAGNOSIS — Z0000 Encounter for general adult medical examination without abnormal findings: Secondary | ICD-10-CM

## 2022-07-24 MED ORDER — LEVOTHYROXINE SODIUM 25 MCG PO TABS: 25.0000 ug | ORAL_TABLET | Freq: Every day | ORAL | 3 refills | Status: DC

## 2022-07-24 MED ORDER — RIVAROXABAN 20 MG PO TABS: 20.0000 mg | ORAL_TABLET | Freq: Every day | ORAL | 3 refills | Status: DC

## 2022-07-24 MED ORDER — FLUTICASONE PROPIONATE 50 MCG/ACT NA SUSP: 2.0000 | Freq: Every day | NASAL | 2 refills | Status: DC

## 2022-07-24 MED ORDER — TAMSULOSIN HCL 0.4 MG PO CAPS: 0.4000 mg | ORAL_CAPSULE | Freq: Every day | ORAL | 3 refills | Status: DC

## 2022-07-24 MED ORDER — VILAZODONE HCL 20 MG PO TABS: 20.0000 mg | ORAL_TABLET | Freq: Every day | ORAL | 3 refills | Status: DC

## 2022-07-24 NOTE — Progress Notes (Signed)
Established Patient Office Visit  Subjective   Patient ID: Marc Lowe, male    DOB: 07-[name]-1951  Age: 72 y.o. MRN: [id number]  Chief Complaint  Patient presents with   Follow-up    HPI Marc Lowe is a 72 year old male coming to the office today for a 6 month routine follow up. Medical history includes depression, hypothyroidism, hyperlipidemia, and anticoagulation disorder.   He states that overall he is doing well. He does complain of some head/ear congestion that is worse in the mornings. He has felt this way since September and claims that this congestion occurs every year during the winter months. He does report some clear nasal discharge in the morning. He does not take any medications for his congestion. He also reports that last week he was having some right ankle pain. He was having difficulty putting any amount of weight on his right foot. He went to an Urgent Care, where they took an X-ray and found a mild ankle spur on the medial malleolus. Since then the pain has gone away and he reports no trouble walking. He did address some concern about cancer due to family history. He goes to a urologist who monitors his PSA.   Review of Systems  Constitutional:  Negative for chills, fever and malaise/fatigue.  HENT:  Positive for congestion. Negative for ear discharge, ear pain, hearing loss and sinus pain.   Respiratory:  Negative for cough and shortness of breath.   Cardiovascular:  Negative for chest pain.  Gastrointestinal:  Negative for abdominal pain, constipation and diarrhea.      Objective:     BP 127/88   Pulse 69   Ht '6\' 2"'$  (1.88 m)   Wt 230 lb (104.3 kg)   SpO2 100%   BMI 29.53 kg/m  BP Readings from Last 3 Encounters:  07/24/22 127/88  07/19/22 120/73  04/14/22 126/81   Wt Readings from Last 3 Encounters:  07/24/22 230 lb (104.3 kg)  03/20/22 231 lb (104.8 kg)  02/19/22 229 lb (103.9 kg)   .[name] Kitchen    08/13/2021    2:41 PM 03/07/2021    9:31 AM 05/29/2020     9:59 AM 05/25/2019    9:11 AM 05/06/2018    9:23 AM  Depression screen PHQ 2/9  Decreased Interest 0 0 0 0 1  Down, Depressed, Hopeless 0 0 0 0 0  PHQ - 2 Score 0 0 0 0 1  Altered sleeping  0   0  Tired, decreased energy  0   0  Change in appetite  0   0  Feeling bad or failure about yourself   0   0  Trouble concentrating  0   0  Moving slowly or fidgety/restless  0   0  Suicidal thoughts  0   0  PHQ-9 Score  0   1  Difficult doing work/chores  Not difficult at all   Not difficult at all      Physical Exam Constitutional:      Appearance: Normal appearance.  HENT:     Right Ear: Tympanic membrane, ear canal and external ear normal.     Left Ear: Tympanic membrane, ear canal and external ear normal.     Mouth/Throat:     Mouth: Mucous membranes are moist.     Pharynx: Oropharynx is clear.  Cardiovascular:     Rate and Rhythm: Normal rate and regular rhythm.     Heart sounds: Normal heart sounds.  Pulmonary:  Effort: Pulmonary effort is normal.     Breath sounds: Normal breath sounds.  Abdominal:     General: Bowel sounds are normal.     Palpations: Abdomen is soft.  Neurological:     Mental Status: He is alert.       The 10-year ASCVD risk score (Arnett DK, et al., 2019) is: 18%    Assessment & Plan:  [name] KitchenMarland KitchenMaykel was seen today for follow-up.  Diagnoses and all orders for this visit:  Preventative health care -     Lipid Panel w/reflex Direct LDL -     COMPLETE METABOLIC PANEL WITH GFR -     TSH -     CBC w/Diff/Platelet  Hypothyroidism, unspecified type -     levothyroxine (EUTHYROX) 25 MCG tablet; Take 1 tablet (25 mcg total) by mouth daily before breakfast. -     COMPLETE METABOLIC PANEL WITH GFR -     TSH  Bilateral pulmonary embolism (HCC) -     rivaroxaban (XARELTO) 20 MG TABS tablet; Take 1 tablet (20 mg total) by mouth daily with supper.  Lupus anticoagulant positive -     rivaroxaban (XARELTO) 20 MG TABS tablet; Take 1 tablet (20 mg  total) by mouth daily with supper.  Leg DVT (deep venous thromboembolism), chronic, left (HCC) -     rivaroxaban (XARELTO) 20 MG TABS tablet; Take 1 tablet (20 mg total) by mouth daily with supper.  Depression, recurrent (HCC) -     Vilazodone HCl (VIIBRYD) 20 MG TABS; Take 1 tablet (20 mg total) by mouth daily.  Medication management -     COMPLETE METABOLIC PANEL WITH GFR  Chronic nasal congestion -     fluticasone (FLONASE) 50 MCG/ACT nasal spray; Place 2 sprays into both nostrils daily.  Benign prostatic hyperplasia without lower urinary tract symptoms -     tamsulosin (FLOMAX) 0.4 MG CAPS capsule; Take 1 capsule (0.4 mg total) by mouth at bedtime.  Acute right ankle pain     Marc Lowe is a 72 year old man who wanted to discuss head congestion, resolved ankle pain, and cancer screening.  Patient was educated that head congestion may be due to the winter months and the pressure change associated with them. Advised that he can use Flonase in the mornings to help with congestive symptoms Advised to monitor for the return of his ankle pain. Can use OTC analgesics if pain returns. Can call if pain becomes unmanageable PSA levels will continue to be monitored by his urologist. Routine blood work done in clinic. Recheck lipid panel due to high levels in the past. No concerns with mood. Refilled viibryd.   Return in about 1 year (around 07/25/2023).    Iran Planas, PA-C

## 2022-07-25 ENCOUNTER — Other Ambulatory Visit: Payer: Self-pay

## 2022-07-25 ENCOUNTER — Inpatient Hospital Stay: Payer: No Typology Code available for payment source | Admitting: Hematology & Oncology

## 2022-07-25 ENCOUNTER — Inpatient Hospital Stay: Payer: No Typology Code available for payment source | Attending: Hematology & Oncology

## 2022-07-25 ENCOUNTER — Encounter: Payer: Self-pay | Admitting: Hematology & Oncology

## 2022-07-25 VITALS — BP 116/85 | HR 76 | Temp 98.6°F | Resp 18 | Ht 74.0 in | Wt 229.0 lb

## 2022-07-25 DIAGNOSIS — Z7901 Long term (current) use of anticoagulants: Secondary | ICD-10-CM | POA: Diagnosis not present

## 2022-07-25 DIAGNOSIS — Z86711 Personal history of pulmonary embolism: Secondary | ICD-10-CM | POA: Diagnosis not present

## 2022-07-25 DIAGNOSIS — R760 Raised antibody titer: Secondary | ICD-10-CM | POA: Diagnosis not present

## 2022-07-25 DIAGNOSIS — D6862 Lupus anticoagulant syndrome: Secondary | ICD-10-CM | POA: Insufficient documentation

## 2022-07-25 DIAGNOSIS — I82502 Chronic embolism and thrombosis of unspecified deep veins of left lower extremity: Secondary | ICD-10-CM | POA: Insufficient documentation

## 2022-07-25 DIAGNOSIS — I824Y2 Acute embolism and thrombosis of unspecified deep veins of left proximal lower extremity: Secondary | ICD-10-CM

## 2022-07-25 DIAGNOSIS — Z7982 Long term (current) use of aspirin: Secondary | ICD-10-CM | POA: Diagnosis not present

## 2022-07-25 LAB — COMPLETE METABOLIC PANEL WITH GFR
AG Ratio: 1.6 (calc) (ref 1.0–2.5)
ALT: 20 U/L (ref 9–46)
AST: 18 U/L (ref 10–35)
Albumin: 4.3 g/dL (ref 3.6–5.1)
Alkaline phosphatase (APISO): 69 U/L (ref 35–144)
BUN: 17 mg/dL (ref 7–25)
CO2: 30 mmol/L (ref 20–32)
Calcium: 9.5 mg/dL (ref 8.6–10.3)
Chloride: 104 mmol/L (ref 98–110)
Creat: 1 mg/dL (ref 0.70–1.28)
Globulin: 2.7 g/dL (calc) (ref 1.9–3.7)
Glucose, Bld: 94 mg/dL (ref 65–99)
Potassium: 4.7 mmol/L (ref 3.5–5.3)
Sodium: 142 mmol/L (ref 135–146)
Total Bilirubin: 0.6 mg/dL (ref 0.2–1.2)
Total Protein: 7 g/dL (ref 6.1–8.1)
eGFR: 81 mL/min/{1.73_m2} (ref >=60)

## 2022-07-25 LAB — LIPID PANEL W/REFLEX DIRECT LDL
Cholesterol: 233 mg/dL — ABNORMAL HIGH (ref <200)
HDL: 50 mg/dL (ref >=40)
LDL Cholesterol (Calc): 155 mg/dL (calc) — ABNORMAL HIGH
Non-HDL Cholesterol (Calc): 183 mg/dL (calc) — ABNORMAL HIGH (ref <130)
Total CHOL/HDL Ratio: 4.7 (calc) (ref <5.0)
Triglycerides: 151 mg/dL — ABNORMAL HIGH (ref <150)

## 2022-07-25 LAB — CBC WITH DIFFERENTIAL (CANCER CENTER ONLY)
Abs Immature Granulocytes: 0.02 10*3/uL (ref 0.00–0.07)
Basophils Absolute: 0 10*3/uL (ref 0.0–0.1)
Basophils Relative: 0 %
Eosinophils Absolute: 0 10*3/uL (ref 0.0–0.5)
Eosinophils Relative: 0 %
HCT: 44.5 % (ref 39.0–52.0)
Hemoglobin: 14.7 g/dL (ref 13.0–17.0)
Immature Granulocytes: 0 %
Lymphocytes Relative: 18 %
Lymphs Abs: 1.3 10*3/uL (ref 0.7–4.0)
MCH: 29.1 pg (ref 26.0–34.0)
MCHC: 33 g/dL (ref 30.0–36.0)
MCV: 88.1 fL (ref 80.0–100.0)
Monocytes Absolute: 0.5 10*3/uL (ref 0.1–1.0)
Monocytes Relative: 8 %
Neutro Abs: 5 10*3/uL (ref 1.7–7.7)
Neutrophils Relative %: 74 %
Platelet Count: 228 10*3/uL (ref 150–400)
RBC: 5.05 MIL/uL (ref 4.22–5.81)
RDW: 13.1 % (ref 11.5–15.5)
WBC Count: 6.8 10*3/uL (ref 4.0–10.5)
nRBC: 0 % (ref 0.0–0.2)

## 2022-07-25 LAB — CMP (CANCER CENTER ONLY)
ALT: 21 U/L (ref 0–44)
AST: 21 U/L (ref 15–41)
Albumin: 4.4 g/dL (ref 3.5–5.0)
Alkaline Phosphatase: 61 U/L (ref 38–126)
Anion gap: 10 (ref 5–15)
BUN: 19 mg/dL (ref 8–23)
CO2: 25 mmol/L (ref 22–32)
Calcium: 10 mg/dL (ref 8.9–10.3)
Chloride: 103 mmol/L (ref 98–111)
Creatinine: 1.04 mg/dL (ref 0.61–1.24)
GFR, Estimated: >60 mL/min (ref >60)
Glucose, Bld: 101 mg/dL — ABNORMAL HIGH (ref 70–99)
Potassium: 4.3 mmol/L (ref 3.5–5.1)
Sodium: 138 mmol/L (ref 135–145)
Total Bilirubin: 0.4 mg/dL (ref 0.3–1.2)
Total Protein: 7.3 g/dL (ref 6.5–8.1)

## 2022-07-25 LAB — CBC WITH DIFFERENTIAL/PLATELET
Absolute Monocytes: 349 cells/uL (ref 200–950)
Basophils Absolute: 29 cells/uL (ref 0–200)
Basophils Relative: 0.7 %
Eosinophils Absolute: 80 cells/uL (ref 15–500)
Eosinophils Relative: 1.9 %
HCT: 46 % (ref 38.5–50.0)
Hemoglobin: 15.5 g/dL (ref 13.2–17.1)
Lymphs Abs: 1079 cells/uL (ref 850–3900)
MCH: 29.2 pg (ref 27.0–33.0)
MCHC: 33.7 g/dL (ref 32.0–36.0)
MCV: 86.8 fL (ref 80.0–100.0)
MPV: 9.9 fL (ref 7.5–12.5)
Monocytes Relative: 8.3 %
Neutro Abs: 2663 cells/uL (ref 1500–7800)
Neutrophils Relative %: 63.4 %
Platelets: 242 10*3/uL (ref 140–400)
RBC: 5.3 10*6/uL (ref 4.20–5.80)
RDW: 12.9 % (ref 11.0–15.0)
Total Lymphocyte: 25.7 %
WBC: 4.2 10*3/uL (ref 3.8–10.8)

## 2022-07-25 LAB — TSH: TSH: 3.89 mIU/L (ref 0.40–4.50)

## 2022-07-25 LAB — D-DIMER, QUANTITATIVE: D-Dimer, Quant: 0.39 ug/mL-FEU (ref 0.00–0.50)

## 2022-07-25 NOTE — Progress Notes (Signed)
Hematology and Oncology Follow Up Visit  Marc Lowe [id number] 1951-06-27 72 y.o. 07/25/2022   Principle Diagnosis:  Bilateral pulmonary emboli/left lower extremity DVT --(+) lupus anticoagulant  Current Therapy:   Xarelto 20 mg p.o. daily-started 12/2020 EC ASA 81 mg po q day -- start on 02/13/2022     Interim History:  Marc Lowe is back for follow-up.  We saw him 6 months ago.  He has been doing well.  Today, he played golf.  He has been doing well on the golf course.  Recently, he did have a bone spur in his right foot.  This caused quite a bit of pain.  However, this seemed to resolve on its own.  When we last saw him back in September, he still tested positive for a lupus anticoagulant.  We did do a Doppler of his left leg back in September.  There was a chronic nonocclusive thrombus in the left lower leg.  I suspect this probably will be long-term also.  He is on Xarelto and baby aspirin.  I suspect he is probably going to need this long-term.  He has had no problems with leg swelling.  He has had no chest wall pain.  He has had no cough or shortness of breath.  He has had no bleeding.  There is no change in bowel or bladder habits.  Overall, I would have to say that his performance status is probably ECOG 0.      Medications:  Current Outpatient Medications:    aspirin EC 81 MG tablet, Take 81 mg by mouth daily. Swallow whole., Disp: , Rfl:    Boswellia-Glucosamine-Vit D (OSTEO BI-FLEX ONE PER DAY PO), Take 2 tablets by mouth daily., Disp: , Rfl:    fluticasone (FLONASE) 50 MCG/ACT nasal spray, Place 2 sprays into both nostrils daily., Disp: 16 g, Rfl: 2   levothyroxine (EUTHYROX) 25 MCG tablet, Take 1 tablet (25 mcg total) by mouth daily before breakfast., Disp: 90 tablet, Rfl: 3   rivaroxaban (XARELTO) 20 MG TABS tablet, Take 1 tablet (20 mg total) by mouth daily with supper., Disp: 90 tablet, Rfl: 3   Sildenafil Citrate (VIAGRA PO), Take by mouth., Disp: ,  Rfl:    tamsulosin (FLOMAX) 0.4 MG CAPS capsule, Take 1 capsule (0.4 mg total) by mouth at bedtime., Disp: 90 capsule, Rfl: 3   Vilazodone HCl (VIIBRYD) 20 MG TABS, Take 1 tablet (20 mg total) by mouth daily., Disp: 90 tablet, Rfl: 3  Allergies:  No Known Allergies   Past Medical History, Surgical history, Social history, and Family History were reviewed and updated.  Review of Systems: Review of Systems  Constitutional: Negative.   HENT:  Negative.    Eyes: Negative.   Respiratory: Negative.    Cardiovascular: Negative.   Gastrointestinal: Negative.   Endocrine: Negative.   Genitourinary: Negative.    Musculoskeletal: Negative.   Skin: Negative.   Neurological: Negative.   Hematological: Negative.   Psychiatric/Behavioral: Negative.      Physical Exam:  height is '6\' 2"'$  (1.88 m) and weight is 229 lb (103.9 kg). His oral temperature is 98.6 F (37 C). His blood pressure is 116/85 and his pulse is 76. His respiration is 18 and oxygen saturation is 98%.   Wt Readings from Last 3 Encounters:  07/25/22 229 lb (103.9 kg)  07/24/22 230 lb (104.3 kg)  03/20/22 231 lb (104.8 kg)    Physical Exam Vitals reviewed.  HENT:     Head: Normocephalic and atraumatic.  Eyes:     Pupils: Pupils are equal, round, and reactive to light.  Cardiovascular:     Rate and Rhythm: Normal rate and regular rhythm.     Heart sounds: Normal heart sounds.  Pulmonary:     Effort: Pulmonary effort is normal.     Breath sounds: Normal breath sounds.  Abdominal:     General: Bowel sounds are normal.     Palpations: Abdomen is soft.  Musculoskeletal:        General: No tenderness or deformity. Normal range of motion.     Cervical back: Normal range of motion.  Lymphadenopathy:     Cervical: No cervical adenopathy.  Skin:    General: Skin is warm and dry.     Findings: No erythema or rash.  Neurological:     Mental Status: He is alert and oriented to person, place, and time.  Psychiatric:         Behavior: Behavior normal.        Thought Content: Thought content normal.        Judgment: Judgment normal.     Lab Results  Component Value Date   WBC 6.8 07/25/2022   HGB 14.7 07/25/2022   HCT 44.5 07/25/2022   MCV 88.1 07/25/2022   PLT 228 07/25/2022     Chemistry      Component Value Date/Time   NA 138 07/25/2022 1527   K 4.3 07/25/2022 1527   CL 103 07/25/2022 1527   CO2 25 07/25/2022 1527   BUN 19 07/25/2022 1527   CREATININE 1.04 07/25/2022 1527   CREATININE 1.00 07/24/2022 1021      Component Value Date/Time   CALCIUM 10.0 07/25/2022 1527   ALKPHOS 61 07/25/2022 1527   AST 21 07/25/2022 1527   ALT 21 07/25/2022 1527   BILITOT 0.4 07/25/2022 1527      Impression and Plan: Marc Lowe is a very nice 71 year old white male.  He is originally from [place].   He had a pulmonary embolus.  He had a left lower extremity DVT.  Again, he has a positive lupus anticoagulant.  I have to believe that this is clearly the most likely culprit for his thromboembolic disease.  I think is going need lifelong anticoagulation.  He is on Xarelto.  He is also on aspirin now.  We will plan to see him back in 6 months.  I think this to be very reasonable.  I just happy that his quality life is good.  When he travels, I told to make sure he hydrates well.   Volanda Napoleon, MD 1/11/20244:23 PM

## 2022-07-26 NOTE — Progress Notes (Signed)
Bhargav,   Hemoglobin and platelets look good.  Thyroid looks great.  Kidney, liver, glucose look great.  LDL has increased.  TG increased as well.   10 year risk is 16.9 percent. I would strongly consider starting a statin to help reduce your CV risk. Thoughts?   [name] KitchenMarland KitchenThe 10-year ASCVD risk score (Arnett DK, et al., 2019) is: 16.6%   Values used to calculate the score:     Age: 72 years     Sex: Male     Is Non-Hispanic African American: No     Diabetic: No     Tobacco smoker: No     Systolic Blood Pressure: 499 mmHg     Is BP treated: No     HDL Cholesterol: 50 mg/dL     Total Cholesterol: 233 mg/dL

## 2022-07-29 LAB — PTT-LA INCUB MIX: PTT-LA Incub Mix: 42.1 s — ABNORMAL HIGH (ref 0.0–40.5)

## 2022-07-29 LAB — LUPUS ANTICOAGULANT PANEL
DRVVT: 56.1 s — ABNORMAL HIGH (ref 0.0–47.0)
PTT Lupus Anticoagulant: 46 s — ABNORMAL HIGH (ref 0.0–43.5)

## 2022-07-29 LAB — PTT-LA MIX: PTT-LA Mix: 39.8 s (ref 0.0–40.5)

## 2022-07-29 LAB — DRVVT MIX: dRVVT Mix: 46.7 s — ABNORMAL HIGH (ref 0.0–40.4)

## 2022-07-29 LAB — DRVVT CONFIRM: dRVVT Confirm: 1.2 ratio (ref 0.8–1.2)

## 2022-07-29 LAB — HEXAGONAL PHASE PHOSPHOLIPID: Hexagonal Phase Phospholipid: 6 s (ref 0–11)

## 2022-07-30 MED ORDER — PITAVASTATIN CALCIUM 4 MG PO TABS: ORAL_TABLET | ORAL | 5 refills | Status: DC

## 2022-07-30 NOTE — Addendum Note (Signed)
Addended by: Donella Stade on: 07/30/2022 07:09 AM   Modules accepted: Orders

## 2022-08-02 LAB — HEXAGONAL PHOSPHOLIPID NEUTRALIZATION: Hexagonal Phospholipid Neutral: 4 s

## 2022-08-15 ENCOUNTER — Ambulatory Visit: Payer: No Typology Code available for payment source | Admitting: Family Medicine

## 2022-08-15 DIAGNOSIS — Z0000 Encounter for general adult medical examination without abnormal findings: Secondary | ICD-10-CM

## 2022-08-15 NOTE — Patient Instructions (Addendum)
[place] Maintenance Summary and Written Plan of Care  Marc Lowe ,  Thank you for allowing me to perform your Medicare Annual Wellness Visit and for your ongoing commitment to your health.   Health Maintenance & Immunization History Health Maintenance  Topic Date Due   COVID-19 Vaccine (3 - Pfizer risk series) 08/31/2022 (Originally 11/18/2019)   INFLUENZA VACCINE  10/13/2022 (Originally 02/12/2022)   Zoster Vaccines- Shingrix (1 of 2) 10/23/2022 (Originally [birth year]/1971)   Pneumonia Vaccine 27+ Years old (2 - PCV) 08/16/2023 (Originally 05/07/2019)   Medicare Annual Wellness (AWV)  08/16/2023   DTaP/Tdap/Td (2 - Td or Tdap) 04/14/2024   COLONOSCOPY (Pts 45-53yr Insurance coverage will need to be confirmed)  10/06/2029   Hepatitis C Screening  Completed   HPV VACCINES  Aged Out   Immunization History  Administered Date(s) Administered   PFIZER(Purple Top)SARS-COV-2 Vaccination 09/30/2019, 10/21/2019   Pneumococcal Polysaccharide-23 05/06/2018   Tdap 04/14/2014   Zoster, Live 04/14/2014    These are the patient goals that we discussed:  Goals Addressed               This Visit's Progress     Patient Stated (pt-stated)        Patient stated that he would like to loose 20 lbs         This is a list of Health Maintenance Items that are overdue or due now: Pneumococcal vaccine  Shingrix vaccine  Orders/Referrals Placed Today: No orders of the defined types were placed in this encounter.  (Contact our referral department at [phone] you have not spoken with someone about your referral appointment within the next 5 days)    Follow-up Plan Follow-up with BDonella Stade PA-C as planned Schedule shingles vaccine at pharmacy. Medicare wellness visit in one year. Patient will access AVS on my chart.      Health Maintenance, Male Adopting a healthy lifestyle and getting preventive care are important in promoting health and  wellness. Ask your health care provider about: The right schedule for you to have regular tests and exams. Things you can do on your own to prevent diseases and keep yourself healthy. What should I know about diet, weight, and exercise? Eat a healthy diet  Eat a diet that includes plenty of vegetables, fruits, low-fat dairy products, and lean protein. Do not eat a lot of foods that are high in solid fats, added sugars, or sodium. Maintain a healthy weight Body mass index (BMI) is a measurement that can be used to identify possible weight problems. It estimates body fat based on height and weight. Your health care provider can help determine your BMI and help you achieve or maintain a healthy weight. Get regular exercise Get regular exercise. This is one of the most important things you can do for your health. Most adults should: Exercise for at least 150 minutes each week. The exercise should increase your heart rate and make you sweat (moderate-intensity exercise). Do strengthening exercises at least twice a week. This is in addition to the moderate-intensity exercise. Spend less time sitting. Even light physical activity can be beneficial. Watch cholesterol and blood lipids Have your blood tested for lipids and cholesterol at 71years of age, then have this test every 5 years. You may need to have your cholesterol levels checked more often if: Your lipid or cholesterol levels are high. You are older than 71years of age. You are at high risk for heart disease. What should  I know about cancer screening? Many types of cancers can be detected early and may often be prevented. Depending on your health history and family history, you may need to have cancer screening at various ages. This may include screening for: Colorectal cancer. Prostate cancer. Skin cancer. Lung cancer. What should I know about heart disease, diabetes, and high blood pressure? Blood pressure and heart disease High  blood pressure causes heart disease and increases the risk of stroke. This is more likely to develop in people who have high blood pressure readings or are overweight. Talk with your health care provider about your target blood pressure readings. Have your blood pressure checked: Every 3-5 years if you are 38-23 years of age. Every year if you are 48 years old or older. If you are between the ages of 27 and 64 and are a current or former smoker, ask your health care provider if you should have a one-time screening for abdominal aortic aneurysm (AAA). Diabetes Have regular diabetes screenings. This checks your fasting blood sugar level. Have the screening done: Once every three years after age 37 if you are at a normal weight and have a low risk for diabetes. More often and at a younger age if you are overweight or have a high risk for diabetes. What should I know about preventing infection? Hepatitis B If you have a higher risk for hepatitis B, you should be screened for this virus. Talk with your health care provider to find out if you are at risk for hepatitis B infection. Hepatitis C Blood testing is recommended for: Everyone born from [birth year] through 1964. Anyone with known risk factors for hepatitis C. Sexually transmitted infections (STIs) You should be screened each year for STIs, including gonorrhea and chlamydia, if: You are sexually active and are younger than 71 years of age. You are older than 71 years of age and your health care provider tells you that you are at risk for this type of infection. Your sexual activity has changed since you were last screened, and you are at increased risk for chlamydia or gonorrhea. Ask your health care provider if you are at risk. Ask your health care provider about whether you are at high risk for HIV. Your health care provider may recommend a prescription medicine to help prevent HIV infection. If you choose to take medicine to prevent HIV, you  should first get tested for HIV. You should then be tested every 3 months for as long as you are taking the medicine. Follow these instructions at home: Alcohol use Do not drink alcohol if your health care provider tells you not to drink. If you drink alcohol: Limit how much you have to 0-2 drinks a day. Know how much alcohol is in your drink. In the [place] one drink equals one 12 oz bottle of beer (355 mL), one 5 oz glass of wine (148 mL), or one 1 oz glass of hard liquor (44 mL). Lifestyle Do not use any products that contain nicotine or tobacco. These products include cigarettes, chewing tobacco, and vaping devices, such as e-cigarettes. If you need help quitting, ask your health care provider. Do not use street drugs. Do not share needles. Ask your health care provider for help if you need support or information about quitting drugs. General instructions Schedule regular health, dental, and eye exams. Stay current with your vaccines. Tell your health care provider if: You often feel depressed. You have ever been abused or do not feel safe at  home. Summary Adopting a healthy lifestyle and getting preventive care are important in promoting health and wellness. Follow your health care provider's instructions about healthy diet, exercising, and getting tested or screened for diseases. Follow your health care provider's instructions on monitoring your cholesterol and blood pressure. This information is not intended to replace advice given to you by your health care provider. Make sure you discuss any questions you have with your health care provider. Document Revised: 11/20/2020 Document Reviewed: 11/20/2020 Elsevier Patient Education  [employer].

## 2022-08-15 NOTE — Progress Notes (Signed)
MEDICARE ANNUAL WELLNESS VISIT  08/15/2022  Telephone Visit Disclaimer This Medicare AWV was conducted by telephone due to national recommendations for restrictions regarding the COVID-19 Pandemic (e.g. social distancing).  I verified, using two identifiers, that I am speaking with Marc Lowe or their authorized healthcare agent. I discussed the limitations, risks, security, and privacy concerns of performing an evaluation and management service by telephone and the potential availability of an in-person appointment in the future. The patient expressed understanding and agreed to proceed.  Location of Patient: Home Location of Provider (nurse):  In the office.  Subjective:    Marc Lowe is a 72 y.o. male patient of Alden Hipp, Royetta Car, PA-C who had a Medicare Annual Wellness Visit today via telephone. Marc Lowe is Retired and lives with their spouse. he has 4 children. he reports that he is socially active and does interact with friends/family regularly. he is moderately physically active and enjoys playing golf.  Patient Care Team: Lavada Mesi as PCP - General (Family Medicine) Darius Bump, [facility] as Pharmacist (Pharmacist)     08/15/2022    8:39 AM 07/25/2022    3:39 PM 03/20/2022    9:03 AM 11/13/2021    8:55 AM 08/28/2021    8:48 AM 08/13/2021    2:40 PM 02/06/2021    3:05 PM  Advanced Directives  Does Patient Have a Medical Advance Directive? Yes Yes No Yes Yes Yes No  Type of Advance Directive Living will Living will;Healthcare Power of [place];Living will [place];Living will Living will   Does patient want to make changes to medical advance directive? No - Patient declined No - Patient declined No - Patient declined No - Patient declined No - Patient declined No - Patient declined   Copy of [place] in Chart?    No - copy requested No - copy requested    Would patient like information on  creating a medical advance directive?       No - Patient declined    Hospital Utilization Over the Past 12 Months: # of hospitalizations or ER visits: 0 # of surgeries: 0  Review of Systems    Patient reports that his overall health is unchanged compared to last year.  History obtained from chart review and the patient  Patient Reported Readings (BP, Pulse, CBG, Weight, etc) none  Pain Assessment Pain : No/denies pain Pain Score: 0-No pain     Current Medications & Allergies (verified) Allergies as of 08/15/2022   No Known Allergies      Medication List        Accurate as of August 15, 2022  8:54 AM. If you have any questions, ask your nurse or doctor.          aspirin EC 81 MG tablet Take 81 mg by mouth daily. Swallow whole.   fluticasone 50 MCG/ACT nasal spray Commonly known as: FLONASE Place 2 sprays into both nostrils daily.   levothyroxine 25 MCG tablet Commonly known as: Euthyrox Take 1 tablet (25 mcg total) by mouth daily before breakfast.   OSTEO BI-FLEX ONE PER DAY PO Take 2 tablets by mouth daily.   Pitavastatin Calcium 4 MG Tabs Take one tablet 3 times a week.   rivaroxaban 20 MG Tabs tablet Commonly known as: Xarelto Take 1 tablet (20 mg total) by mouth daily with supper.   tamsulosin 0.4 MG Caps capsule Commonly known as: FLOMAX Take 1  capsule (0.4 mg total) by mouth at bedtime.   VIAGRA PO Take by mouth.   Vilazodone HCl 20 MG Tabs Commonly known as: Viibryd Take 1 tablet (20 mg total) by mouth daily.        History (reviewed): Past Medical History:  Diagnosis Date   BPH (benign prostatic hyperplasia)    Depression    Diverticulitis    Hypothyroidism (acquired)    Pulmonary emboli (HCC)    Past Surgical History:  Procedure Laterality Date   COLON RESECTION SIGMOID     2005   history of colostomy  2005   s/p takedown with mesh   IR RADIOLOGIST EVAL & MGMT  01/17/2021   Family History  Problem Relation Age of Onset    Atrial fibrillation Father    Leukemia Sister 20       CML   Social History   Socioeconomic History   Marital status: Married    Spouse name: Marc Lowe   Number of children: 4   Years of education: 14   Highest education level: Bachelor's degree (e.g., BA, AB, BS)  Occupational History   Occupation: Chief Strategy Officer    Comment: retired  Tobacco Use   Smoking status: Never   Smokeless tobacco: Never  Vaping Use   Vaping Use: Never used  Substance and Sexual Activity   Alcohol use: Yes    Alcohol/week: 3.0 standard drinks of alcohol    Types: 3 Glasses of wine per week    Comment: 5 oz   Drug use: No   Sexual activity: Yes    Partners: Female  Other Topics Concern   Not on file  Social History Narrative   Lives with his wife. They have four children. Plays golf once a week. 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    Social Determinants of Health   Financial Resource Strain: Low Risk  (08/15/2022)   Overall Financial Resource Strain (CARDIA)    Difficulty of Paying Living Expenses: Not hard at all  Food Insecurity: No Food Insecurity (08/15/2022)   Hunger Vital Sign    Worried About Running Out of Food in the Last Year: Never true    Ran Out of Food in the Last Year: Never true  Transportation Needs: No Transportation Needs (08/15/2022)   PRAPARE - Hydrologist (Medical): No    Lack of Transportation (Non-Medical): No  Physical Activity: Sufficiently Active (08/15/2022)   Exercise Vital Sign    Days of Exercise per Week: 3 days    Minutes of Exercise per Session: 150+ min  Stress: No Stress Concern Present (08/15/2022)   [place]    Feeling of Stress : Not at all  Social Connections: Moderately Isolated (08/15/2022)   Social Connection and Isolation Panel [NHANES]    Frequency of Communication with Friends and Family: More than three times a week    Frequency of Social  Gatherings with Friends and Family: Three times a week    Attends Religious Services: Never    Active Member of Clubs or Organizations: No    Attends Archivist Meetings: Never    Marital Status: Married    Activities of Daily Living    08/15/2022    8:42 AM  In your present state of health, do you have any difficulty performing the following activities:  Hearing? 0  Vision? 0  Difficulty concentrating or making decisions? 1  Comment little memory loss  Walking  or climbing stairs? 0  Dressing or bathing? 0  Doing errands, shopping? 0  Preparing Food and eating ? N  Using the Toilet? N  In the past six months, have you accidently leaked urine? N  Do you have problems with loss of bowel control? N  Managing your Medications? N  Managing your Finances? N  Housekeeping or managing your Housekeeping? N    Patient Education/ Literacy How often do you need to have someone help you when you read instructions, pamphlets, or other written materials from your doctor or pharmacy?: 1 - Never What is the last grade level you completed in school?: 2 years of college  Exercise Current Exercise Habits: Home exercise routine, Type of exercise: Other - see comments;walking (golf), Time (Minutes): > 60, Frequency (Times/Week): 3, Weekly Exercise (Minutes/Week): 0, Intensity: Moderate, Exercise limited by: None identified  Diet Patient reports consuming  2-3  meals a day and 1 snack(s) a day Patient reports that his primary diet is: Regular Patient reports that she does have regular access to food.   Depression Screen    08/15/2022    8:40 AM 07/24/2022    2:00 PM 08/13/2021    2:41 PM 03/07/2021    9:31 AM 05/29/2020    9:59 AM 05/25/2019    9:11 AM 05/06/2018    9:23 AM  PHQ 2/9 Scores  PHQ - 2 Score 0 0 0 0 0 0 1  PHQ- 9 Score  2  0   1  Exception Documentation      Medical reason      Fall Risk    08/15/2022    8:40 AM 07/24/2022    9:33 AM 08/13/2021    2:41 PM 05/29/2020    10:08 AM 05/25/2019    9:11 AM  Fall Risk   Falls in the past year? 0 1 0 0 0  Number falls in past yr: 0 0 0 0 0  Injury with Fall? 0 0 0 0 0  Risk for fall due to : No Fall Risks History of fall(s) No Fall Risks No Fall Risks   Follow up Falls evaluation completed;Education provided Falls evaluation completed Falls evaluation completed;Education provided Falls prevention discussed Falls prevention discussed     Objective:  Marc Lowe seemed alert and oriented and he participated appropriately during our telephone visit.  Blood Pressure Weight BMI  BP Readings from Last 3 Encounters:  07/25/22 116/85  07/24/22 127/88  07/19/22 120/73   Wt Readings from Last 3 Encounters:  07/25/22 229 lb (103.9 kg)  07/24/22 230 lb (104.3 kg)  03/20/22 231 lb (104.8 kg)   BMI Readings from Last 1 Encounters:  07/25/22 29.40 kg/m    *Unable to obtain current vital signs, weight, and BMI due to telephone visit type  Hearing/Vision  [place] did not seem to have difficulty with hearing/understanding during the telephone conversation Reports that he has had a formal eye exam by an eye care professional within the past year Reports that he has not had a formal hearing evaluation within the past year *Unable to fully assess hearing and vision during telephone visit type  Cognitive Function:    08/15/2022    8:48 AM 08/13/2021    2:49 PM 05/29/2020   10:10 AM 05/25/2019    9:14 AM  6CIT Screen  What Year? 0 points 0 points 0 points 0 points  What month? 0 points 0 points 0 points 0 points  What time? 0 points 0 points 0  points 0 points  Count back from 20 0 points 0 points 0 points 0 points  Months in reverse 0 points 0 points 0 points 0 points  Repeat phrase 0 points 0 points 0 points 0 points  Total Score 0 points 0 points 0 points 0 points   (Normal:0-7, Significant for Dysfunction: >8)  Normal Cognitive Function Screening: Yes   Immunization & Health Maintenance  Record Immunization History  Administered Date(s) Administered   PFIZER(Purple Top)SARS-COV-2 Vaccination 09/30/2019, 10/21/2019   Pneumococcal Polysaccharide-23 05/06/2018   Tdap 04/14/2014   Zoster, Live 04/14/2014    Health Maintenance  Topic Date Due   COVID-19 Vaccine (3 - Pfizer risk series) 08/31/2022 (Originally 11/18/2019)   INFLUENZA VACCINE  10/13/2022 (Originally 02/12/2022)   Zoster Vaccines- Shingrix (1 of 2) 10/23/2022 (Originally 06/30/1971)   Pneumonia Vaccine 18+ Years old (2 - PCV) 08/16/2023 (Originally 05/07/2019)   Medicare Annual Wellness (AWV)  08/16/2023   DTaP/Tdap/Td (2 - Td or Tdap) 04/14/2024   COLONOSCOPY (Pts 45-33yr Insurance coverage will need to be confirmed)  10/06/2029   Hepatitis C Screening  Completed   HPV VACCINES  Aged Out       Assessment  This is a routine wellness examination for SLaurey Lowe  Health Maintenance: Due or Overdue There are no preventive care reminders to display for this patient.   SLaurey Arrowdoes not need a referral for Community Assistance: Care Management:   no Social Work:    no Prescription Assistance:  no Nutrition/Diabetes Education:  no   Plan:  Personalized Goals  Goals Addressed               This Visit's Progress     Patient Stated (pt-stated)        Patient stated that he would like to loose 20 lbs       Personalized Health Maintenance & Screening Recommendations  Pneumococcal vaccine  Shingrix vaccine  Lung Cancer Screening Recommended: no (Low Dose CT Chest recommended if Age 71-80years, 30 pack-year currently smoking OR have quit w/in past 15 years) Hepatitis C Screening recommended: no HIV Screening recommended: no  Advanced Directives: Written information was not prepared per patient's request.  Referrals & Orders No orders of the defined types were placed in this encounter.   Follow-up Plan Follow-up with BDonella Stade PA-C as planned Schedule shingles vaccine  at pharmacy. Medicare wellness visit in one year. Patient will access AVS on my chart.   I have personally reviewed and noted the following in the patient's chart:   Medical and social history Use of alcohol, tobacco or illicit drugs  Current medications and supplements Functional ability and status Nutritional status Physical activity Advanced directives List of other physicians Hospitalizations, surgeries, and ER visits in previous 12 months Vitals Screenings to include cognitive, depression, and falls Referrals and appointments  In addition, I have reviewed and discussed with SLaurey Arrowcertain preventive protocols, quality metrics, and best practice recommendations. A written personalized care plan for preventive services as well as general preventive health recommendations is available and can be mailed to the patient at his request.      BTinnie Gens RN BSN  08/15/2022

## 2022-08-23 ENCOUNTER — Ambulatory Visit: Payer: No Typology Code available for payment source | Admitting: Physician Assistant

## 2022-08-24 ENCOUNTER — Other Ambulatory Visit: Payer: Self-pay | Admitting: Physician Assistant

## 2022-08-24 DIAGNOSIS — F339 Major depressive disorder, recurrent, unspecified: Secondary | ICD-10-CM

## 2022-10-21 ENCOUNTER — Other Ambulatory Visit: Payer: Self-pay | Admitting: Physician Assistant

## 2022-10-21 DIAGNOSIS — R0981 Nasal congestion: Secondary | ICD-10-CM

## 2022-10-23 DIAGNOSIS — L578 Other skin changes due to chronic exposure to nonionizing radiation: Secondary | ICD-10-CM | POA: Diagnosis not present

## 2022-10-23 DIAGNOSIS — L820 Inflamed seborrheic keratosis: Secondary | ICD-10-CM | POA: Diagnosis not present

## 2022-10-23 DIAGNOSIS — R3121 Asymptomatic microscopic hematuria: Secondary | ICD-10-CM | POA: Diagnosis not present

## 2022-10-23 DIAGNOSIS — N401 Enlarged prostate with lower urinary tract symptoms: Secondary | ICD-10-CM | POA: Diagnosis not present

## 2022-10-23 DIAGNOSIS — R3912 Poor urinary stream: Secondary | ICD-10-CM | POA: Diagnosis not present

## 2022-10-23 DIAGNOSIS — N5201 Erectile dysfunction due to arterial insufficiency: Secondary | ICD-10-CM | POA: Diagnosis not present

## 2022-10-23 DIAGNOSIS — R972 Elevated prostate specific antigen [PSA]: Secondary | ICD-10-CM | POA: Diagnosis not present

## 2022-10-23 DIAGNOSIS — L821 Other seborrheic keratosis: Secondary | ICD-10-CM | POA: Diagnosis not present

## 2022-10-24 DIAGNOSIS — R948 Abnormal results of function studies of other organs and systems: Secondary | ICD-10-CM | POA: Diagnosis not present

## 2022-10-24 DIAGNOSIS — N5201 Erectile dysfunction due to arterial insufficiency: Secondary | ICD-10-CM | POA: Diagnosis not present

## 2022-12-10 ENCOUNTER — Telehealth: Payer: Self-pay | Admitting: Family Medicine

## 2022-12-10 NOTE — Telephone Encounter (Signed)
Note received from [facility] asking if they could hold his Xarelto 2 days prior to procedure.  Symptomatic boxing was okay to hold the night before the procedure.  Thus only skipping 1 dose.  And then resuming immediately after the procedure.  If life on the medication is only 9 to 12 hours.

## 2022-12-20 ENCOUNTER — Telehealth: Payer: Self-pay | Admitting: Family Medicine

## 2022-12-20 NOTE — Telephone Encounter (Signed)
[facility] called to follow up on [place] please contact [phone]

## 2022-12-20 NOTE — Telephone Encounter (Signed)
stop

## 2022-12-24 NOTE — Telephone Encounter (Signed)
I tried to call [facility] and was on hold for 15 minutes.

## 2022-12-24 NOTE — Telephone Encounter (Signed)
The front desk rep states a nurse will call me back tomorrow.

## 2022-12-31 NOTE — Telephone Encounter (Signed)
Marc Lowe called back. She left a number [phone] with extension [phone]. I called and it did not allow me to enter an extension. I left a message with the front desk to call back.

## 2022-12-31 NOTE — Telephone Encounter (Signed)
Left message for a return call

## 2023-01-03 NOTE — Telephone Encounter (Signed)
Spoke with Aundra Millet, RN at [facility]. She states a form has already been signed by Tandy Gaw to stop med 2 days prior and restart the day after. Forwarding these messages to Tandy Gaw, [place] for review.

## 2023-01-24 ENCOUNTER — Inpatient Hospital Stay (HOSPITAL_BASED_OUTPATIENT_CLINIC_OR_DEPARTMENT_OTHER): Payer: No Typology Code available for payment source | Admitting: Hematology & Oncology

## 2023-01-24 ENCOUNTER — Inpatient Hospital Stay: Payer: No Typology Code available for payment source | Attending: Hematology & Oncology

## 2023-01-24 ENCOUNTER — Encounter: Payer: Self-pay | Admitting: Hematology & Oncology

## 2023-01-24 VITALS — BP 124/79 | HR 65 | Temp 98.5°F | Resp 20 | Ht 74.0 in | Wt 232.0 lb

## 2023-01-24 DIAGNOSIS — R760 Raised antibody titer: Secondary | ICD-10-CM | POA: Diagnosis not present

## 2023-01-24 DIAGNOSIS — Z7982 Long term (current) use of aspirin: Secondary | ICD-10-CM | POA: Insufficient documentation

## 2023-01-24 DIAGNOSIS — I2699 Other pulmonary embolism without acute cor pulmonale: Secondary | ICD-10-CM | POA: Insufficient documentation

## 2023-01-24 DIAGNOSIS — Z7901 Long term (current) use of anticoagulants: Secondary | ICD-10-CM | POA: Diagnosis not present

## 2023-01-24 DIAGNOSIS — D6862 Lupus anticoagulant syndrome: Secondary | ICD-10-CM | POA: Insufficient documentation

## 2023-01-24 DIAGNOSIS — I82402 Acute embolism and thrombosis of unspecified deep veins of left lower extremity: Secondary | ICD-10-CM | POA: Insufficient documentation

## 2023-01-24 LAB — CBC WITH DIFFERENTIAL (CANCER CENTER ONLY)
Abs Immature Granulocytes: 0.01 10*3/uL (ref 0.00–0.07)
Basophils Absolute: 0 10*3/uL (ref 0.0–0.1)
Basophils Relative: 1 %
Eosinophils Absolute: 0 10*3/uL (ref 0.0–0.5)
Eosinophils Relative: 1 %
HCT: 44.6 % (ref 39.0–52.0)
Hemoglobin: 14.9 g/dL (ref 13.0–17.0)
Immature Granulocytes: 0 %
Lymphocytes Relative: 23 %
Lymphs Abs: 1.3 10*3/uL (ref 0.7–4.0)
MCH: 29.6 pg (ref 26.0–34.0)
MCHC: 33.4 g/dL (ref 30.0–36.0)
MCV: 88.7 fL (ref 80.0–100.0)
Monocytes Absolute: 0.6 10*3/uL (ref 0.1–1.0)
Monocytes Relative: 11 %
Neutro Abs: 3.6 10*3/uL (ref 1.7–7.7)
Neutrophils Relative %: 64 %
Platelet Count: 218 10*3/uL (ref 150–400)
RBC: 5.03 MIL/uL (ref 4.22–5.81)
RDW: 13.5 % (ref 11.5–15.5)
WBC Count: 5.5 10*3/uL (ref 4.0–10.5)
nRBC: 0 % (ref 0.0–0.2)

## 2023-01-24 LAB — CMP (CANCER CENTER ONLY)
ALT: 16 U/L (ref 0–44)
AST: 18 U/L (ref 15–41)
Albumin: 4.2 g/dL (ref 3.5–5.0)
Alkaline Phosphatase: 63 U/L (ref 38–126)
Anion gap: 6 (ref 5–15)
BUN: 15 mg/dL (ref 8–23)
CO2: 32 mmol/L (ref 22–32)
Calcium: 9.9 mg/dL (ref 8.9–10.3)
Chloride: 103 mmol/L (ref 98–111)
Creatinine: 1.08 mg/dL (ref 0.61–1.24)
GFR, Estimated: >60 mL/min (ref >60)
Glucose, Bld: 99 mg/dL (ref 70–99)
Potassium: 4.7 mmol/L (ref 3.5–5.1)
Sodium: 141 mmol/L (ref 135–145)
Total Bilirubin: 0.5 mg/dL (ref 0.3–1.2)
Total Protein: 6.9 g/dL (ref 6.5–8.1)

## 2023-01-24 NOTE — Progress Notes (Signed)
Hematology and Oncology Follow Up Visit  Marc Lowe [id number] 12/21/1950 72 y.o. 01/24/2023   Principle Diagnosis:  Bilateral pulmonary emboli/left lower extremity DVT --(+) lupus anticoagulant  Current Therapy:   Xarelto 20 mg p.o. daily-started 12/2020 EC ASA 81 mg po q day -- start on 02/13/2022     Interim History:  Marc Lowe is back for follow-up.  We saw him 6 months ago.  So far, everything is going quite well for him.  He really has had no complaints.  He is still playing quite a bit of golf.  He is on Xarelto and aspirin.  He does have some easy bruising.  He had a relatively large ecchymoses on the inside of his left thigh.  This did resolve spontaneously.  He has had no issues with cough or shortness of breath.  He has had no chest wall pain.  Has had no change in bowel or bladder habits.    There has been no problems with COVID.  Currently, I would say that his performance status is probably ECOG 0.     Medications:  Current Outpatient Medications:    aspirin EC 81 MG tablet, Take 81 mg by mouth daily. Swallow whole., Disp: , Rfl:    Boswellia-Glucosamine-Vit D (OSTEO BI-FLEX ONE PER DAY PO), Take 2 tablets by mouth daily., Disp: , Rfl:    fluticasone (FLONASE) 50 MCG/ACT nasal spray, SPRAY 2 SPRAYS INTO EACH NOSTRIL EVERY DAY, Disp: 48 mL, Rfl: 0   levothyroxine (EUTHYROX) 25 MCG tablet, Take 1 tablet (25 mcg total) by mouth daily before breakfast., Disp: 90 tablet, Rfl: 3   rivaroxaban (XARELTO) 20 MG TABS tablet, Take 1 tablet (20 mg total) by mouth daily with supper., Disp: 90 tablet, Rfl: 3   Sildenafil Citrate (VIAGRA PO), Take by mouth., Disp: , Rfl:    tamsulosin (FLOMAX) 0.4 MG CAPS capsule, Take 1 capsule (0.4 mg total) by mouth at bedtime., Disp: 90 capsule, Rfl: 3   Vilazodone HCl 20 MG TABS, TAKE ONE TABLET BY MOUTH ONE TIME DAILY, Disp: 90 tablet, Rfl: 2   Pitavastatin Calcium 4 MG TABS, Take one tablet 3 times a week. (Patient not taking: Reported  on 01/24/2023), Disp: 12 tablet, Rfl: 5  Allergies:  No Known Allergies   Past Medical History, Surgical history, Social history, and Family History were reviewed and updated.  Review of Systems: Review of Systems  Constitutional: Negative.   HENT:  Negative.    Eyes: Negative.   Respiratory: Negative.    Cardiovascular: Negative.   Gastrointestinal: Negative.   Endocrine: Negative.   Genitourinary: Negative.    Musculoskeletal: Negative.   Skin: Negative.   Neurological: Negative.   Hematological: Negative.   Psychiatric/Behavioral: Negative.      Physical Exam:  height is 6\' 2"  (1.88 m) and weight is 232 lb (105.2 kg). His oral temperature is 98.5 F (36.9 C). His blood pressure is 124/79 and his pulse is 65. His respiration is 20 and oxygen saturation is 98%.   Wt Readings from Last 3 Encounters:  01/24/23 232 lb (105.2 kg)  07/25/22 229 lb (103.9 kg)  07/24/22 230 lb (104.3 kg)    Physical Exam Vitals reviewed.  HENT:     Head: Normocephalic and atraumatic.  Eyes:     Pupils: Pupils are equal, round, and reactive to light.  Cardiovascular:     Rate and Rhythm: Normal rate and regular rhythm.     Heart sounds: Normal heart sounds.  Pulmonary:  Effort: Pulmonary effort is normal.     Breath sounds: Normal breath sounds.  Abdominal:     General: Bowel sounds are normal.     Palpations: Abdomen is soft.  Musculoskeletal:        General: No tenderness or deformity. Normal range of motion.     Cervical back: Normal range of motion.  Lymphadenopathy:     Cervical: No cervical adenopathy.  Skin:    General: Skin is warm and dry.     Findings: No erythema or rash.  Neurological:     Mental Status: He is alert and oriented to person, place, and time.  Psychiatric:        Behavior: Behavior normal.        Thought Content: Thought content normal.        Judgment: Judgment normal.     Lab Results  Component Value Date   WBC 5.5 01/24/2023   HGB 14.9  01/24/2023   HCT 44.6 01/24/2023   MCV 88.7 01/24/2023   PLT 218 01/24/2023     Chemistry      Component Value Date/Time   NA 141 01/24/2023 1509   K 4.7 01/24/2023 1509   CL 103 01/24/2023 1509   CO2 32 01/24/2023 1509   BUN 15 01/24/2023 1509   CREATININE 1.08 01/24/2023 1509   CREATININE 1.00 07/24/2022 1021      Component Value Date/Time   CALCIUM 9.9 01/24/2023 1509   ALKPHOS 63 01/24/2023 1509   AST 18 01/24/2023 1509   ALT 16 01/24/2023 1509   BILITOT 0.5 01/24/2023 1509      Impression and Plan: Marc Lowe is a very nice 72 year old white male.  He is originally from [place].   He is doing well on the Xarelto/aspirin.  So far, there is been no problems with respect to thromboembolism.  I think every 69-month follow-up is reasonable.  I told him that if he has another large bruise, that he can always give [place] a call and send [place] a picture on the cell phone.    Josph Macho, MD 7/12/20244:00 PM

## 2023-01-26 LAB — DRVVT MIX: dRVVT Mix: 54.7 s — ABNORMAL HIGH (ref 0.0–40.4)

## 2023-01-26 LAB — LUPUS ANTICOAGULANT PANEL
DRVVT: 81.8 s — ABNORMAL HIGH (ref 0.0–47.0)
PTT Lupus Anticoagulant: 47.4 s — ABNORMAL HIGH (ref 0.0–43.5)

## 2023-01-26 LAB — BETA-2-GLYCOPROTEIN I ABS, IGG/M/A
Beta-2 Glyco I IgG: <9 GPI IgG units (ref 0–20)
Beta-2-Glycoprotein I IgA: <9 GPI IgA units (ref 0–25)
Beta-2-Glycoprotein I IgM: <9 GPI IgM units (ref 0–32)

## 2023-01-26 LAB — PTT-LA MIX: PTT-LA Mix: 44 s — ABNORMAL HIGH (ref 0.0–40.5)

## 2023-01-26 LAB — HEXAGONAL PHASE PHOSPHOLIPID: Hexagonal Phase Phospholipid: 4 s (ref 0–11)

## 2023-01-26 LAB — DRVVT CONFIRM: dRVVT Confirm: 1.5 ratio — ABNORMAL HIGH (ref 0.8–1.2)

## 2023-01-27 LAB — CARDIOLIPIN ANTIBODIES, IGG, IGM, IGA
Anticardiolipin IgA: <9 APL U/mL (ref 0–11)
Anticardiolipin IgG: <9 GPL U/mL (ref 0–14)
Anticardiolipin IgM: <9 MPL U/mL (ref 0–12)

## 2023-02-06 DIAGNOSIS — N401 Enlarged prostate with lower urinary tract symptoms: Secondary | ICD-10-CM | POA: Diagnosis not present

## 2023-02-06 DIAGNOSIS — R3912 Poor urinary stream: Secondary | ICD-10-CM | POA: Diagnosis not present

## 2023-02-06 DIAGNOSIS — E291 Testicular hypofunction: Secondary | ICD-10-CM | POA: Diagnosis not present

## 2023-03-03 DIAGNOSIS — H524 Presbyopia: Secondary | ICD-10-CM | POA: Diagnosis not present

## 2023-03-03 DIAGNOSIS — Z961 Presence of intraocular lens: Secondary | ICD-10-CM | POA: Diagnosis not present

## 2023-03-25 DIAGNOSIS — E291 Testicular hypofunction: Secondary | ICD-10-CM | POA: Diagnosis not present

## 2023-03-28 ENCOUNTER — Ambulatory Visit (INDEPENDENT_AMBULATORY_CARE_PROVIDER_SITE_OTHER): Payer: No Typology Code available for payment source

## 2023-03-28 ENCOUNTER — Ambulatory Visit
Admission: EM | Admit: 2023-03-28 | Discharge: 2023-03-28 | Disposition: A | Discharge status: Home / Self Care | Payer: No Typology Code available for payment source | Attending: Physician Assistant | Admitting: Physician Assistant

## 2023-03-28 ENCOUNTER — Other Ambulatory Visit: Payer: Self-pay

## 2023-03-28 DIAGNOSIS — R0789 Other chest pain: Secondary | ICD-10-CM | POA: Diagnosis not present

## 2023-03-28 DIAGNOSIS — R059 Cough, unspecified: Secondary | ICD-10-CM | POA: Diagnosis not present

## 2023-03-28 DIAGNOSIS — R079 Chest pain, unspecified: Secondary | ICD-10-CM

## 2023-03-28 MED ORDER — METHOCARBAMOL 500 MG PO TABS: 500.0000 mg | ORAL_TABLET | Freq: Four times a day (QID) | ORAL | 0 refills | Status: DC

## 2023-03-28 NOTE — ED Triage Notes (Addendum)
Right chest wall soreness that radiates into right shoulder blade, denies injury. Worse with movement or coughing.

## 2023-03-28 NOTE — ED Provider Notes (Signed)
[facility]    CSN: [id number] Arrival date & time: 03/28/23  1151      History   Chief Complaint Chief Complaint  Patient presents with   Chest Pain    HPI Marc Lowe is a 72 y.o. male.   Patient complains of pain in the right side of his chest in the axilla and right side under her shoulder around to right scapula.  Patient reports pain with movement.  He thinks the pain began after sleeping in an unusual position in a recliner.  Patient reports he had COVID 2 weeks ago.  He denies any fever or chills he is not having any shortness of breath.  Patient has had a DVT in his left leg.  Patient reports he is on a blood thinner he has not missed any dosages.  Patient complains of pain with movement.  Patient denies any swelling in his legs.   Chest Pain   Past Medical History:  Diagnosis Date   BPH (benign prostatic hyperplasia)    Depression    Diverticulitis    Hypothyroidism (acquired)    Pulmonary emboli [facility])     Patient Active Problem List   Diagnosis Date Noted   Chronic nasal congestion 07/24/2022   Benign prostatic hyperplasia without lower urinary tract symptoms 07/24/2022   Acute right ankle pain 07/24/2022   DDD (degenerative disc disease), lumbar 02/20/2022   Bilateral hip joint arthritis 02/20/2022   Bilateral hip pain 02/19/2022   Acute bilateral low back pain without sciatica 02/19/2022   Mid back pain 02/19/2022   Acute right-sided low back pain without sciatica 12/18/2021   Fall 12/18/2021   Actinic keratosis 12/18/2021   Lupus anticoagulant positive 12/18/2021   Leg DVT (deep venous thromboembolism), chronic, left (HCC) 12/18/2021   Lumbar spondylosis 10/30/2021   Hernia, ventral 10/30/2021   Localized osteoarthritis of left shoulder 08/20/2021   Osteoarthritis of right hip 08/20/2021   Chronic left shoulder pain 08/17/2021   Right hip pain 08/17/2021   Age spots 08/17/2021   Colonic diverticular abscess 01/04/2021    Hypothyroidism 01/04/2021   Acute deep vein thrombosis (DVT) of proximal vein of left lower extremity (HCC) 01/03/2021   History of COVID-19 01/03/2021   Acute respiratory failure with hypoxia (HCC) 12/27/2020   Bilateral pulmonary embolism (HCC) 12/27/2020   Need for zoster vaccination 05/29/2020   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   Seborrheic keratoses 02/21/2020   Cervical radiculopathy 02/18/2020   DDD (degenerative disc disease), cervical 02/18/2020   Memory changes 02/18/2020   Anhedonia 05/13/2017   Abnormal weight gain 05/13/2017   Elevated TSH 12/18/2016   No energy 12/17/2016   Toenail fungus 12/16/2016   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   History of diverticulitis 05/08/2016   Cervical nerve root impingement 04/11/2015   Hyperlipidemia 04/15/2014   History of bowel resection 04/14/2014   VARICES OF OTHER SITES 10/04/2009   IMPAIRED FASTING GLUCOSE 07/24/2009   ELEVATED PROSTATE SPECIFIC ANTIGEN 04/10/2009   CHRONIC RHINITIS 06/04/2007   CONDYLOMA ACUMINATA 03/26/2007   HYPOGONADISM, MALE 11/04/2006   Depression, recurrent (HCC) 11/04/2006   ACNE ROSACEA 10/07/2006   LIBIDO, DECREASED 10/07/2006    Past Surgical History:  Procedure Laterality Date   COLON RESECTION SIGMOID     2005   history of colostomy  2005   s/p takedown with mesh   IR RADIOLOGIST EVAL & MGMT  01/17/2021       Home Medications    Prior to Admission medications  Medication Sig Start Date End Date Taking? Authorizing Provider  methocarbamol (ROBAXIN) 500 MG tablet Take 1 tablet (500 mg total) by mouth 4 (four) times daily. 03/28/23  Yes Elson Areas, PA-C  aspirin EC 81 MG tablet Take 81 mg by mouth daily. Swallow whole.    [provider]  Boswellia-Glucosamine-Vit D (OSTEO BI-FLEX ONE PER DAY PO) Take 2 tablets by mouth daily.    [provider]  fluticasone (FLONASE) 50 MCG/ACT nasal spray SPRAY 2 SPRAYS INTO EACH NOSTRIL EVERY DAY 10/21/22    Tandy Gaw L, PA-C  levothyroxine (EUTHYROX) 25 MCG tablet Take 1 tablet (25 mcg total) by mouth daily before breakfast. 07/24/22   Breeback, Jade L, PA-C  Pitavastatin Calcium 4 MG TABS Take one tablet 3 times a week. Patient not taking: Reported on 01/24/2023 07/30/22   Jomarie Longs, PA-C  rivaroxaban (XARELTO) 20 MG TABS tablet Take 1 tablet (20 mg total) by mouth daily with supper. 07/24/22   Breeback, Lonna Cobb, PA-C  Sildenafil Citrate (VIAGRA PO) Take by mouth.    [provider]  tamsulosin (FLOMAX) 0.4 MG CAPS capsule Take 1 capsule (0.4 mg total) by mouth at bedtime. 07/24/22   Breeback, Jade L, PA-C  Vilazodone HCl 20 MG TABS TAKE ONE TABLET BY MOUTH ONE TIME DAILY 08/26/22   Jomarie Longs, PA-C    Family History Family History  Problem Relation Age of Onset   Atrial fibrillation Father    Leukemia Sister 107       CML    Social History Social History   Tobacco Use   Smoking status: Never   Smokeless tobacco: Never  Vaping Use   Vaping status: Never Used  Substance Use Topics   Alcohol use: Yes    Alcohol/week: 3.0 standard drinks of alcohol    Types: 3 Glasses of wine per week    Comment: 5 oz   Drug use: No     Allergies   Patient has no known allergies.   Review of Systems Review of Systems  Cardiovascular:  Positive for chest pain.  All other systems reviewed and are negative.    Physical Exam Triage Vital Signs ED Triage Vitals  Encounter Vitals Group     BP 03/28/23 1157 134/87     Systolic BP Percentile --      Diastolic BP Percentile --      Pulse Rate 03/28/23 1157 65     Resp 03/28/23 1157 16     Temp 03/28/23 1157 97.8 F (36.6 C)     Temp Source 03/28/23 1157 Oral     SpO2 03/28/23 1157 99 %     Weight --      Height --      Head Circumference --      Peak Flow --      Pain Score 03/28/23 1159 2     Pain Loc --      Pain Education --      Exclude from Growth Chart --    No data found.  Updated Vital Signs BP  134/87 (BP Location: Left Arm)   Pulse 65   Temp 97.8 F (36.6 C) (Oral)   Resp 16   SpO2 99%   Visual Acuity Right Eye Distance:   Left Eye Distance:   Bilateral Distance:    Right Eye Near:   Left Eye Near:    Bilateral Near:     Physical Exam Vitals and nursing note reviewed.  Constitutional:  Appearance: He is well-developed.  HENT:     Head: Normocephalic.  Cardiovascular:     Rate and Rhythm: Normal rate and regular rhythm.     Heart sounds: Normal heart sounds.  Pulmonary:     Effort: Pulmonary effort is normal.  Chest:     Chest wall: Tenderness present.  Abdominal:     General: There is no distension.  Musculoskeletal:        General: Normal range of motion.     Cervical back: Normal range of motion.  Skin:    General: Skin is warm.  Neurological:     Mental Status: He is alert and oriented to person, place, and time.      UC Treatments / Results  Labs (all labs ordered are listed, but only abnormal results are displayed) Labs Reviewed - No data to display  EKG   Radiology DG Chest 2 View  Result Date: 03/28/2023 CLINICAL DATA:  Right-sided chest wall pain, cough. EXAM: CHEST - 2 VIEW COMPARISON:  None Available. FINDINGS: The heart size and mediastinal contours are within normal limits. Both lungs are clear. The visualized skeletal structures are unremarkable. IMPRESSION: No active cardiopulmonary disease. Electronically Signed   By: Lupita Raider M.D.   On: 03/28/2023 14:19    Procedures Procedures (including critical care time)  Medications Ordered in UC Medications - No data to display  Initial Impression / Assessment and Plan / UC Course  I have reviewed the triage vital signs and the nursing notes.  Pertinent labs & imaging results that were available during my care of the patient were reviewed by me and considered in my medical decision making (see chart for details).     Chest x-ray shows no acute disease.  There is pain with  palpation there is pain with range of motion.  Pain seems to be musculoskeletal.  I had a conversation with patient regarding the fact that he has had a DVT in the past and risk of pulmonary embolus.  Patient understands that if he should experience any shortness of breath he should go to the emergency department for further evaluation.  Patient is advised to try Tylenol I will give him Robaxin for the discomfort.  Final diagnoses:  Chest wall pain     Discharge Instructions      Tylenol every hours.  Go to the Emergency department if shortness of breath.     ED Prescriptions     Medication Sig Dispense Auth. Provider   methocarbamol (ROBAXIN) 500 MG tablet Take 1 tablet (500 mg total) by mouth 4 (four) times daily. 20 tablet Elson Areas, [place]      PDMP not reviewed this encounter. An After Visit Summary was printed and given to the patient.       Elson Areas, [place] 03/28/23 1704

## 2023-03-28 NOTE — Discharge Instructions (Addendum)
Tylenol every hours.  Go to the Emergency department if shortness of breath.

## 2023-04-01 ENCOUNTER — Ambulatory Visit (INDEPENDENT_AMBULATORY_CARE_PROVIDER_SITE_OTHER): Payer: No Typology Code available for payment source | Admitting: Physician Assistant

## 2023-04-01 ENCOUNTER — Encounter: Payer: Self-pay | Admitting: Physician Assistant

## 2023-04-01 VITALS — BP 116/74 | HR 78 | Ht 74.0 in | Wt 229.0 lb

## 2023-04-01 DIAGNOSIS — L03113 Cellulitis of right upper limb: Secondary | ICD-10-CM

## 2023-04-01 DIAGNOSIS — M79661 Pain in right lower leg: Secondary | ICD-10-CM | POA: Diagnosis not present

## 2023-04-01 DIAGNOSIS — R0789 Other chest pain: Secondary | ICD-10-CM

## 2023-04-01 DIAGNOSIS — L237 Allergic contact dermatitis due to plants, except food: Secondary | ICD-10-CM | POA: Diagnosis not present

## 2023-04-01 MED ORDER — CEPHALEXIN 500 MG PO CAPS: 500.0000 mg | ORAL_CAPSULE | Freq: Two times a day (BID) | ORAL | 0 refills | Status: DC

## 2023-04-01 MED ORDER — CLOBETASOL PROPIONATE 0.05 % EX CREA
1.0000 | TOPICAL_CREAM | Freq: Two times a day (BID) | CUTANEOUS | 0 refills | Status: DC
Start: 2023-04-01 — End: 2023-10-20

## 2023-04-01 MED ORDER — PREDNISONE 50 MG PO TABS: ORAL_TABLET | ORAL | 0 refills | Status: DC

## 2023-04-01 NOTE — Patient Instructions (Signed)
Poison Ivy Dermatitis Poison ivy dermatitis is irritation and swelling (inflammation) of the skin caused by chemicals in the leaves of the poison ivy plant. The skin reaction often involves redness, blisters, and extreme itching. What are the causes? This condition is caused by a chemical (urushiol) found in the sap of the poison ivy plant. This chemical is sticky and can easily spread to people, animals, and objects. You can get poison ivy dermatitis by: Having direct contact with a poison ivy plant. Touching animals, other people, or objects that have come in contact with poison ivy and have the chemical on them. What increases the risk? This condition is more likely to develop in people who: Are outdoors often in wooded or [place] areas. Go outdoors without wearing protective clothing, such as closed shoes, long pants, and a long-sleeved shirt. What are the signs or symptoms? Symptoms of this condition include: Redness of the skin. Extreme itching. A rash that often includes bumps and blisters. The rash usually appears 48 hours after exposure, if you have been exposed before. If this is the first time you have been exposed, the rash may not appear until a week after exposure. Swelling. This may occur if the reaction is more severe. Symptoms usually last for 1-2 weeks. However, the first time you develop this condition, symptoms may last 3-4 weeks. How is this diagnosed? This condition may be diagnosed based on your symptoms and a physical exam. Your health care provider may also ask you about any recent outdoor activity. How is this treated? Treatment for this condition will vary depending on how severe it is. Treatment may include: Hydrocortisone cream or calamine lotion to relieve itching. Oatmeal baths to soothe the skin. Medicines, such as over-the-counter antihistamine tablets. Oral or injected steroid medicine, for more severe reactions. Follow these instructions at  home: Medicines Take or apply over-the-counter and prescription medicines only as told by your health care provider. Use hydrocortisone cream or calamine lotion as needed to soothe the skin and relieve itching. General instructions Do not scratch or rub your skin. Apply a cold, wet cloth (cold compress) to the affected areas or take baths in cool water. This will help with itching. Avoid hot baths and showers. Take oatmeal baths as needed. Use colloidal oatmeal. You can get this at your local pharmacy or grocery store. Follow the instructions on the packaging. Wash all clothes, bedsheets, towels, and blankets you were in contact with between your exposure and appearance of the rash. Check the affected area every day for signs of infection. Check for: More redness, swelling, or pain. Fluid or blood. Warmth. Pus or a bad smell. Keep all follow-up visits. Your health care provider may want to see how your skin is progressing with treatment. How is this prevented?  Learn to identify the poison ivy plant and avoid contact with the plant. This plant can be recognized by the number of leaves. Generally, poison ivy has three leaves with flowering branches on a single stem. The leaves are typically glossy, and they have jagged edges that come to a point. If you have been exposed to poison ivy, thoroughly wash with soap and water right away. You have about 30 minutes to remove the plant resin before it will cause the rash. Be sure to wash under your fingernails, because any plant resin there will continue to spread the rash. When hiking or camping, wear clothes that will help you to avoid skin exposure. This includes long pants, a long-sleeved shirt, long socks,  and hiking boots. You can also apply preventive lotion to your skin to help limit exposure. If you suspect that your clothes or outdoor gear came in contact with poison ivy, rinse them off outside with a garden hose before you bring them inside  your house. When doing yard work or gardening, wear gloves, long sleeves, long pants, and boots. Wash your garden tools and gloves if they come in contact with poison ivy. If you suspect that your pet has come into contact with poison ivy, wash them with pet shampoo and water. Make sure to wear gloves while washing your pet. Contact a health care provider if: You have open sores in the rash area. You have any signs of infection. You have redness that spreads beyond the rash area. You have a fever. You have a rash over a large area of your body. You have a rash on your eyes, mouth, or genitals. You have a rash that does not improve after a few weeks. Get help right away if: Your face swells or your eyes swell shut. You have trouble breathing. You have trouble swallowing. These symptoms may be an emergency. Get help right away. Call 911. Do not wait to see if the symptoms will go away. Do not drive yourself to the hospital. This information is not intended to replace advice given to you by your health care provider. Make sure you discuss any questions you have with your health care provider. Document Revised: 11/29/2021 Document Reviewed: 11/29/2021 Elsevier Patient Education  2024 [employer].

## 2023-04-01 NOTE — Progress Notes (Unsigned)
Acute Office Visit  Subjective:     Patient ID: Marc Lowe, male    DOB: [birth year], 72 y.o.   MRN: [id number]  Chief Complaint  Patient presents with   Rash    C/o rash on bilateral arms, legs and chest x Thursdy 03/27/23 after crawling under house.    HPI Patient is a 72 year old man in today for rash on bilateral arms, legs, and trunk and right sided chest wall pain.  Rash started on 03/27/23 after doing some work underneath his house. Rash is very erythematous with irregular borders and present in patches on his bilateral extremities and trunk. Patient states rash is very itchy and believes it is spreading. He has been using topical OTC cortisone for symptom relief. Patient does have a larger patch on flexor surface of his right wrist that is very erythematous with a central area of eschar from repetitive scratching. Area feels mildly indurated.   Patient also states that he has been having pain in his right chest wall. Pain is reproducible upon palpation. He denies any known injury, recent illness, SOB, pain with respiration, or fever. Patient states that he was seen in urgent care last week and received a chest xray that was within normal limits. He received methocarbamol for symptom relief and states that it has improved his pain. Pain is elicited mainly with shoulder movement and deep breathing.  ROS      Objective:    BP 116/74   Pulse 78   Ht 6\' 2"  (1.88 m)   Wt 103.9 kg   SpO2 97%   BMI 29.40 kg/m  {Vitals History (Optional):23777}  Physical Exam  No results found for any visits on 04/01/23.      Assessment & Plan:   Problem List Items Addressed This Visit   None Visit Diagnoses     Right-sided chest wall pain    -  Primary   Pain in right lower leg       Poison ivy dermatitis       Cellulitis of right upper extremity           Meds ordered this encounter  Medications   predniSONE (DELTASONE) 50 MG tablet    Sig: Take one tablet for 5 days.     Dispense:  5 tablet    Refill:  0    Order Specific Question:   Supervising Provider    Answer:   Nani Gasser D [[id number]]   cephALEXin (KEFLEX) 500 MG capsule    Sig: Take 1 capsule (500 mg total) by mouth 2 (two) times daily.    Dispense:  14 capsule    Refill:  0    Order Specific Question:   Supervising Provider    Answer:   Nani Gasser D [[id number]]   clobetasol cream (TEMOVATE) 0.05 %    Sig: Apply 1 Application topically 2 (two) times daily.    Dispense:  60 g    Refill:  0    Order Specific Question:   Supervising Provider    Answer:   Nani Gasser D [[id number]]    No follow-ups on file.  Cira Rue, [place]

## 2023-04-02 ENCOUNTER — Encounter: Payer: Self-pay | Admitting: Physician Assistant

## 2023-04-03 ENCOUNTER — Telehealth: Payer: Self-pay | Admitting: Gastroenterology

## 2023-04-03 DIAGNOSIS — Z7901 Long term (current) use of anticoagulants: Secondary | ICD-10-CM | POA: Diagnosis not present

## 2023-04-03 DIAGNOSIS — Z008 Encounter for other general examination: Secondary | ICD-10-CM | POA: Diagnosis not present

## 2023-04-03 DIAGNOSIS — I82502 Chronic embolism and thrombosis of unspecified deep veins of left lower extremity: Secondary | ICD-10-CM | POA: Diagnosis not present

## 2023-04-03 DIAGNOSIS — Z6829 Body mass index (BMI) 29.0-29.9, adult: Secondary | ICD-10-CM | POA: Diagnosis not present

## 2023-04-03 DIAGNOSIS — E663 Overweight: Secondary | ICD-10-CM | POA: Diagnosis not present

## 2023-04-03 DIAGNOSIS — E785 Hyperlipidemia, unspecified: Secondary | ICD-10-CM | POA: Diagnosis not present

## 2023-04-03 DIAGNOSIS — D692 Other nonthrombocytopenic purpura: Secondary | ICD-10-CM | POA: Diagnosis not present

## 2023-04-03 NOTE — Telephone Encounter (Signed)
Good afternoon Dr. Myrtie Neither  The following patient is asking for a transfer request to have a colonoscopy done. He has previous history with [facility] and can no longer go there because they do not take his insurance. Records are available in Epic. Please review and advise of scheduling. Thank you.

## 2023-04-07 NOTE — Telephone Encounter (Signed)
Patient is on oral anticoagulation.  Please make next available clinic visit with me or APP and we will review colonoscopy records and make further recommendations at that time.  H Danis

## 2023-05-14 NOTE — Telephone Encounter (Signed)
Left VM for PT to call and schedule OV

## 2023-05-20 ENCOUNTER — Encounter: Payer: Self-pay | Admitting: Gastroenterology

## 2023-06-10 DIAGNOSIS — E291 Testicular hypofunction: Secondary | ICD-10-CM | POA: Diagnosis not present

## 2023-06-10 DIAGNOSIS — N401 Enlarged prostate with lower urinary tract symptoms: Secondary | ICD-10-CM | POA: Diagnosis not present

## 2023-06-19 DIAGNOSIS — R3912 Poor urinary stream: Secondary | ICD-10-CM | POA: Diagnosis not present

## 2023-06-19 DIAGNOSIS — N401 Enlarged prostate with lower urinary tract symptoms: Secondary | ICD-10-CM | POA: Diagnosis not present

## 2023-06-19 DIAGNOSIS — E291 Testicular hypofunction: Secondary | ICD-10-CM | POA: Diagnosis not present

## 2023-07-28 ENCOUNTER — Ambulatory Visit: Payer: No Typology Code available for payment source | Admitting: Physician Assistant

## 2023-08-07 ENCOUNTER — Ambulatory Visit
Admission: RE | Admit: 2023-08-07 | Discharge: 2023-08-07 | Disposition: A | Discharge status: Home / Self Care | Payer: No Typology Code available for payment source | Source: Ambulatory Visit | Attending: Emergency Medicine | Admitting: Emergency Medicine

## 2023-08-07 ENCOUNTER — Ambulatory Visit: Payer: No Typology Code available for payment source

## 2023-08-07 VITALS — BP 127/74 | HR 108 | Temp 99.9°F | Resp 17

## 2023-08-07 DIAGNOSIS — B349 Viral infection, unspecified: Secondary | ICD-10-CM

## 2023-08-07 DIAGNOSIS — R059 Cough, unspecified: Secondary | ICD-10-CM

## 2023-08-07 DIAGNOSIS — R0989 Other specified symptoms and signs involving the circulatory and respiratory systems: Secondary | ICD-10-CM | POA: Diagnosis not present

## 2023-08-07 DIAGNOSIS — R051 Acute cough: Secondary | ICD-10-CM

## 2023-08-07 LAB — POCT INFLUENZA A/B
Influenza A, POC: NEGATIVE
Influenza B, POC: NEGATIVE

## 2023-08-07 MED ORDER — PROMETHAZINE-DM 6.25-15 MG/5ML PO SYRP: 5.0000 mL | ORAL_SOLUTION | Freq: Four times a day (QID) | ORAL | 0 refills | Status: DC | PRN

## 2023-08-07 MED ORDER — PREDNISONE 20 MG PO TABS: 40.0000 mg | ORAL_TABLET | Freq: Every day | ORAL | 0 refills | Status: DC

## 2023-08-07 MED ORDER — BENZONATATE 100 MG PO CAPS: 100.0000 mg | ORAL_CAPSULE | Freq: Three times a day (TID) | ORAL | 0 refills | Status: DC | PRN

## 2023-08-07 NOTE — ED Provider Notes (Signed)
[facility]    CSN: [id number] Arrival date & time: 08/07/23  1553      History   Chief Complaint Chief Complaint  Patient presents with   Cough    Entered by patient   Generalized Body Aches    HPI Marc Lowe is a 73 y.o. male.  Here with 2-3 day history of body aches, nasal drainage, fatigue, and productive cough Tactile fever and chills Tried dayquil and nyquil that didn't help Symptoms began after returning from cruise. Wife had mild symptoms during cruise.   Sees primary care next week  Past Medical History:  Diagnosis Date   BPH (benign prostatic hyperplasia)    Depression    Diverticulitis    Hypothyroidism (acquired)    Pulmonary emboli (HCC)     Patient Active Problem List   Diagnosis Date Noted   Chronic nasal congestion 07/24/2022   Benign prostatic hyperplasia without lower urinary tract symptoms 07/24/2022   Acute right ankle pain 07/24/2022   DDD (degenerative disc disease), lumbar 02/20/2022   Bilateral hip joint arthritis 02/20/2022   Bilateral hip pain 02/19/2022   Acute bilateral low back pain without sciatica 02/19/2022   Mid back pain 02/19/2022   Acute right-sided low back pain without sciatica 12/18/2021   Fall 12/18/2021   Actinic keratosis 12/18/2021   Lupus anticoagulant positive 12/18/2021   Leg DVT (deep venous thromboembolism), chronic, left (HCC) 12/18/2021   Lumbar spondylosis 10/30/2021   Hernia, ventral 10/30/2021   Localized osteoarthritis of left shoulder 08/20/2021   Osteoarthritis of right hip 08/20/2021   Chronic left shoulder pain 08/17/2021   Right hip pain 08/17/2021   Age spots 08/17/2021   Colonic diverticular abscess 01/04/2021   Hypothyroidism 01/04/2021   Acute deep vein thrombosis (DVT) of proximal vein of left lower extremity (HCC) 01/03/2021   History of COVID-19 01/03/2021   Acute respiratory failure with hypoxia (HCC) 12/27/2020   Bilateral pulmonary embolism (HCC) 12/27/2020   Need for  zoster vaccination 05/29/2020   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   Seborrheic keratoses 02/21/2020   Cervical radiculopathy 02/18/2020   DDD (degenerative disc disease), cervical 02/18/2020   Memory changes 02/18/2020   Anhedonia 05/13/2017   Abnormal weight gain 05/13/2017   Elevated TSH 12/18/2016   No energy 12/17/2016   Toenail fungus 12/16/2016   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   History of diverticulitis 05/08/2016   Cervical nerve root impingement 04/11/2015   Hyperlipidemia 04/15/2014   History of bowel resection 04/14/2014   VARICES OF OTHER SITES 10/04/2009   IMPAIRED FASTING GLUCOSE 07/24/2009   ELEVATED PROSTATE SPECIFIC ANTIGEN 04/10/2009   CHRONIC RHINITIS 06/04/2007   CONDYLOMA ACUMINATA 03/26/2007   HYPOGONADISM, MALE 11/04/2006   Depression, recurrent (HCC) 11/04/2006   ACNE ROSACEA 10/07/2006   LIBIDO, DECREASED 10/07/2006    Past Surgical History:  Procedure Laterality Date   COLON RESECTION SIGMOID     2005   history of colostomy  2005   s/p takedown with mesh   IR RADIOLOGIST EVAL & MGMT  01/17/2021       Home Medications    Prior to Admission medications   Medication Sig Start Date End Date Taking? Authorizing Provider  benzonatate (TESSALON) 100 MG capsule Take 1 capsule (100 mg total) by mouth 3 (three) times daily as needed for cough. 08/07/23  Yes Arelyn Gauer, Lurena Joiner, PA-C  predniSONE (DELTASONE) 20 MG tablet Take 2 tablets (40 mg total) by mouth daily with breakfast for 5 days. 08/07/23 08/12/23 Yes Stephie Xu,  Lurena Joiner, PA-C  promethazine-dextromethorphan (PROMETHAZINE-DM) 6.25-15 MG/5ML syrup Take 5 mLs by mouth 4 (four) times daily as needed for cough. 08/07/23  Yes Khyre Germond, Marc Lowe  aspirin EC 81 MG tablet Take 81 mg by mouth daily. Swallow whole.    [provider]  Boswellia-Glucosamine-Vit D (OSTEO BI-FLEX ONE PER DAY PO) Take 2 tablets by mouth daily.    [provider]  clobetasol cream (TEMOVATE) 0.05 %  Apply 1 Application topically 2 (two) times daily. 04/01/23   Breeback, Jade L, PA-C  fluticasone (FLONASE) 50 MCG/ACT nasal spray SPRAY 2 SPRAYS INTO EACH NOSTRIL EVERY DAY 10/21/22   Tandy Gaw L, PA-C  levothyroxine (EUTHYROX) 25 MCG tablet Take 1 tablet (25 mcg total) by mouth daily before breakfast. 07/24/22   Breeback, Jade L, PA-C  methocarbamol (ROBAXIN) 500 MG tablet Take 1 tablet (500 mg total) by mouth 4 (four) times daily. 03/28/23   Elson Areas, PA-C  Pitavastatin Calcium 4 MG TABS Take one tablet 3 times a week. 07/30/22   Breeback, Lonna Cobb, PA-C  rivaroxaban (XARELTO) 20 MG TABS tablet Take 1 tablet (20 mg total) by mouth daily with supper. 07/24/22   Breeback, Lonna Cobb, PA-C  Sildenafil Citrate (VIAGRA PO) Take by mouth.    [provider]  tamsulosin (FLOMAX) 0.4 MG CAPS capsule Take 1 capsule (0.4 mg total) by mouth at bedtime. 07/24/22   Breeback, Jade L, PA-C  Vilazodone HCl 20 MG TABS TAKE ONE TABLET BY MOUTH ONE TIME DAILY 08/26/22   Jomarie Longs, PA-C    Family History Family History  Problem Relation Age of Onset   Atrial fibrillation Father    Leukemia Sister 71       CML    Social History Social History   Tobacco Use   Smoking status: Never   Smokeless tobacco: Never  Vaping Use   Vaping status: Never Used  Substance Use Topics   Alcohol use: Yes    Alcohol/week: 3.0 standard drinks of alcohol    Types: 3 Glasses of wine per week    Comment: 5 oz   Drug use: No     Allergies   Patient has no known allergies.   Review of Systems Review of Systems  Respiratory:  Positive for cough.    Per HPI  Physical Exam Triage Vital Signs ED Triage Vitals  Encounter Vitals Group     BP 08/07/23 1605 127/74     Systolic BP Percentile --      Diastolic BP Percentile --      Pulse Rate 08/07/23 1605 (!) 108     Resp 08/07/23 1605 17     Temp 08/07/23 1605 99.9 F (37.7 C)     Temp Source 08/07/23 1605 Oral     SpO2 08/07/23 1605 92 %      Weight --      Height --      Head Circumference --      Peak Flow --      Pain Score 08/07/23 1606 2     Pain Loc --      Pain Education --      Exclude from Growth Chart --    No data found.  Updated Vital Signs BP 127/74 (BP Location: Left Arm)   Pulse (!) 108   Temp 99.9 F (37.7 C) (Oral)   Resp 17   SpO2 92%   HR 88 at discharge   Physical Exam Vitals and nursing note reviewed.  Constitutional:      General: He is not in acute distress. HENT:     Right Ear: Tympanic membrane and ear canal normal.     Left Ear: Tympanic membrane and ear canal normal.     Nose: Congestion present. No rhinorrhea.     Mouth/Throat:     Mouth: Mucous membranes are moist.     Pharynx: Oropharynx is clear. No posterior oropharyngeal erythema.  Eyes:     Conjunctiva/sclera: Conjunctivae normal.  Cardiovascular:     Rate and Rhythm: Normal rate and regular rhythm.     Pulses: Normal pulses.     Heart sounds: Normal heart sounds.  Pulmonary:     Effort: Pulmonary effort is normal.     Breath sounds: Wheezing and rales present.     Comments: Wet and rattling cough in clinic. Faint expiratory wheezing. Crackles in RLL Musculoskeletal:     Cervical back: Normal range of motion.  Lymphadenopathy:     Cervical: No cervical adenopathy.  Skin:    General: Skin is warm and dry.  Neurological:     Mental Status: He is alert and oriented to person, place, and time.     UC Treatments / Results  Labs (all labs ordered are listed, but only abnormal results are displayed) Labs Reviewed  POCT INFLUENZA A/B    EKG  Radiology DG Chest 2 View Result Date: 08/07/2023 CLINICAL DATA:  Cough and rales. EXAM: CHEST - 2 VIEW COMPARISON:  Chest radiograph dated 03/28/2023. FINDINGS: No focal consolidation, pleural effusion, or pneumothorax. The cardiac silhouette is within normal limits. Osteopenia. No acute osseous pathology. IMPRESSION: No active cardiopulmonary disease. Electronically Signed    By: Elgie Collard M.D.   On: 08/07/2023 17:31    Procedures Procedures  Medications Ordered in UC Medications - No data to display  Initial Impression / Assessment and Plan / UC Course  I have reviewed the triage vital signs and the nursing notes.  Pertinent labs & imaging results that were available during my care of the patient were reviewed by me and considered in my medical decision making (see chart for details).  Temp 100 Rapid flu A/B negative With lung sounds, xray obtained and is negative. Compared to reading from 09/24 there are no changes. Prednisone 40 mg daily x 5 Tessalon TID. Can also try promethazine, discussed drowsy precautions. Return and ED precautions. Patient is agreeable to plan, no questions at this time   Final Clinical Impressions(s) / UC Diagnoses   Final diagnoses:  Acute cough  Viral syndrome     Discharge Instructions      Prednisone 2 tablets daily for 5 days The tessalon cough pills can be taken 3x daily.The promethazine DM cough syrup can be used up to 4 times daily. If this medication makes you drowsy, take only once before bed.     ED Prescriptions     Medication Sig Dispense Auth. Provider   promethazine-dextromethorphan (PROMETHAZINE-DM) 6.25-15 MG/5ML syrup Take 5 mLs by mouth 4 (four) times daily as needed for cough. 240 mL Marc Matos, PA-C   benzonatate (TESSALON) 100 MG capsule Take 1 capsule (100 mg total) by mouth 3 (three) times daily as needed for cough. 30 capsule Marc Olander, PA-C   predniSONE (DELTASONE) 20 MG tablet Take 2 tablets (40 mg total) by mouth daily with breakfast for 5 days. 10 tablet Marc Lowe, Lurena Joiner, PA-C      PDMP not reviewed this encounter.   Marlow Baars, [place] 08/07/23 1801

## 2023-08-07 NOTE — ED Triage Notes (Signed)
Pt c/o cough and bodyaches x 3 days. Dayquil/nyquil prn. Recent international travel.

## 2023-08-07 NOTE — Discharge Instructions (Signed)
Prednisone 2 tablets daily for 5 days The tessalon cough pills can be taken 3x daily.The promethazine DM cough syrup can be used up to 4 times daily. If this medication makes you drowsy, take only once before bed.

## 2023-08-12 ENCOUNTER — Ambulatory Visit (INDEPENDENT_AMBULATORY_CARE_PROVIDER_SITE_OTHER): Payer: No Typology Code available for payment source | Admitting: Physician Assistant

## 2023-08-12 ENCOUNTER — Encounter: Payer: Self-pay | Admitting: Physician Assistant

## 2023-08-12 VITALS — BP 104/64 | HR 77 | Temp 98.5°F | Ht 74.0 in | Wt 230.0 lb

## 2023-08-12 DIAGNOSIS — R062 Wheezing: Secondary | ICD-10-CM

## 2023-08-12 DIAGNOSIS — R760 Raised antibody titer: Secondary | ICD-10-CM

## 2023-08-12 DIAGNOSIS — E039 Hypothyroidism, unspecified: Secondary | ICD-10-CM

## 2023-08-12 DIAGNOSIS — J329 Chronic sinusitis, unspecified: Secondary | ICD-10-CM

## 2023-08-12 DIAGNOSIS — J40 Bronchitis, not specified as acute or chronic: Secondary | ICD-10-CM

## 2023-08-12 DIAGNOSIS — Z79899 Other long term (current) drug therapy: Secondary | ICD-10-CM

## 2023-08-12 DIAGNOSIS — I82502 Chronic embolism and thrombosis of unspecified deep veins of left lower extremity: Secondary | ICD-10-CM | POA: Diagnosis not present

## 2023-08-12 DIAGNOSIS — Z125 Encounter for screening for malignant neoplasm of prostate: Secondary | ICD-10-CM

## 2023-08-12 DIAGNOSIS — F339 Major depressive disorder, recurrent, unspecified: Secondary | ICD-10-CM | POA: Diagnosis not present

## 2023-08-12 DIAGNOSIS — Z131 Encounter for screening for diabetes mellitus: Secondary | ICD-10-CM

## 2023-08-12 DIAGNOSIS — N400 Enlarged prostate without lower urinary tract symptoms: Secondary | ICD-10-CM | POA: Diagnosis not present

## 2023-08-12 DIAGNOSIS — E782 Mixed hyperlipidemia: Secondary | ICD-10-CM

## 2023-08-12 DIAGNOSIS — Z0000 Encounter for general adult medical examination without abnormal findings: Secondary | ICD-10-CM

## 2023-08-12 DIAGNOSIS — Z13220 Encounter for screening for lipoid disorders: Secondary | ICD-10-CM

## 2023-08-12 DIAGNOSIS — I2699 Other pulmonary embolism without acute cor pulmonale: Secondary | ICD-10-CM | POA: Diagnosis not present

## 2023-08-12 MED ORDER — IPRATROPIUM-ALBUTEROL 0.5-2.5 (3) MG/3ML IN SOLN
3.0000 mL | Freq: Once | RESPIRATORY_TRACT | Status: AC
  Administered 2023-08-12: 3 mL via RESPIRATORY_TRACT

## 2023-08-12 MED ORDER — AZITHROMYCIN 250 MG PO TABS: ORAL_TABLET | ORAL | 0 refills | Status: DC

## 2023-08-12 MED ORDER — METHYLPREDNISOLONE ACETATE 80 MG/ML IJ SUSP
80.0000 mg | Freq: Once | INTRAMUSCULAR | Status: AC
  Administered 2023-08-12: 80 mg via INTRAMUSCULAR

## 2023-08-12 MED ORDER — ALBUTEROL SULFATE HFA 108 (90 BASE) MCG/ACT IN AERS: 2.0000 | INHALATION_SPRAY | Freq: Four times a day (QID) | RESPIRATORY_TRACT | 0 refills | Status: AC | PRN

## 2023-08-12 NOTE — Progress Notes (Signed)
Complete physical exam  Patient: Marc Lowe   DOB: [birth year]   73 y.o. Male  MRN: [id number]  Subjective:    Chief Complaint  Patient presents with   Annual Exam    Chest/sinus congestion, sob and drainage x 3 weeks    Marc Lowe is a 73 y.o. male who presents today for a complete physical exam. He reports consuming a general diet. He goes golfing 3x a week. He generally feels well. He reports sleeping well. He does have additional problems to discuss today.   He has been experiencing a productive cough x 10 days and producing yellow phlegm. He states he has been having post-nasal drip and excessive fatigue. He went to UC 5 days ago and was given prednisone and Tessalon pearls for the cough but he has not noticed any improvement. He had to stop prednisone early because of increased heart rate. He endorses experiencing chills, rhinorrhea and some wheezing. He denies CP, palpitations, dyspnea, headaches, fevers, sore throat, sinus pain, or otalgia.   Most recent fall risk assessment:    08/12/2023    3:10 PM  Fall Risk   Falls in the past year? 0  Number falls in past yr: 0  Injury with Fall? 0  Risk for fall due to : No Fall Risks  Follow up Falls evaluation completed     Most recent depression screenings:    08/12/2023    3:10 PM 08/15/2022    8:40 AM  PHQ 2/9 Scores  PHQ - 2 Score 0 0  PHQ- 9 Score 0     Vision:Within last year, Dental: No current dental problems and Receives regular dental care, STD: The patient denies history of sexually transmitted disease., and PSA: Agrees to PSA testing  Patient Active Problem List   Diagnosis Date Noted   Chronic nasal congestion 07/24/2022   Benign prostatic hyperplasia without lower urinary tract symptoms 07/24/2022   Acute right ankle pain 07/24/2022   DDD (degenerative disc disease), lumbar 02/20/2022   Bilateral hip joint arthritis 02/20/2022   Bilateral hip pain 02/19/2022   Acute bilateral low back pain  without sciatica 02/19/2022   Mid back pain 02/19/2022   Acute right-sided low back pain without sciatica 12/18/2021   Fall 12/18/2021   Actinic keratosis 12/18/2021   Lupus anticoagulant positive 12/18/2021   Leg DVT (deep venous thromboembolism), chronic, left (HCC) 12/18/2021   Lumbar spondylosis 10/30/2021   Hernia, ventral 10/30/2021   Localized osteoarthritis of left shoulder 08/20/2021   Osteoarthritis of right hip 08/20/2021   Chronic left shoulder pain 08/17/2021   Right hip pain 08/17/2021   Age spots 08/17/2021   Colonic diverticular abscess 01/04/2021   Hypothyroidism 01/04/2021   Acute deep vein thrombosis (DVT) of proximal vein of left lower extremity (HCC) 01/03/2021   History of COVID-19 01/03/2021   Acute respiratory failure with hypoxia (HCC) 12/27/2020   Bilateral pulmonary embolism (HCC) 12/27/2020   Need for zoster vaccination 05/29/2020   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   Seborrheic keratoses 02/21/2020   Cervical radiculopathy 02/18/2020   DDD (degenerative disc disease), cervical 02/18/2020   Memory changes 02/18/2020   Anhedonia 05/13/2017   Abnormal weight gain 05/13/2017   Elevated TSH 12/18/2016   No energy 12/17/2016   Toenail fungus 12/16/2016   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   History of diverticulitis 05/08/2016   Cervical nerve root impingement 04/11/2015   Hyperlipidemia 04/15/2014   History of bowel resection 04/14/2014   VARICES OF OTHER  SITES 10/04/2009   IMPAIRED FASTING GLUCOSE 07/24/2009   ELEVATED PROSTATE SPECIFIC ANTIGEN 04/10/2009   CHRONIC RHINITIS 06/04/2007   CONDYLOMA ACUMINATA 03/26/2007   HYPOGONADISM, MALE 11/04/2006   Depression, recurrent (HCC) 11/04/2006   ACNE ROSACEA 10/07/2006   LIBIDO, DECREASED 10/07/2006   Past Medical History:  Diagnosis Date   BPH (benign prostatic hyperplasia)    Depression    Diverticulitis    Hypothyroidism (acquired)    Pulmonary emboli (HCC)    Past Surgical  History:  Procedure Laterality Date   COLON RESECTION SIGMOID     2005   history of colostomy  2005   s/p takedown with mesh   IR RADIOLOGIST EVAL & MGMT  01/17/2021   Social History   Tobacco Use   Smoking status: Never   Smokeless tobacco: Never  Vaping Use   Vaping status: Never Used  Substance Use Topics   Alcohol use: Yes    Alcohol/week: 3.0 standard drinks of alcohol    Types: 3 Glasses of wine per week    Comment: 5 oz   Drug use: No   Family History  Problem Relation Age of Onset   Atrial fibrillation Father    Leukemia Sister 46       CML   No Known Allergies    Patient Care Team: Nolene Ebbs as PCP - General (Family Medicine) Gabriel Carina, [facility] (Inactive) as Pharmacist (Pharmacist)   Outpatient Medications Prior to Visit  Medication Sig   aspirin EC 81 MG tablet Take 81 mg by mouth daily. Swallow whole.   Boswellia-Glucosamine-Vit D (OSTEO BI-FLEX ONE PER DAY PO) Take 2 tablets by mouth daily.   clobetasol cream (TEMOVATE) 0.05 % Apply 1 Application topically 2 (two) times daily.   fluticasone (FLONASE) 50 MCG/ACT nasal spray SPRAY 2 SPRAYS INTO EACH NOSTRIL EVERY DAY   levothyroxine (EUTHYROX) 25 MCG tablet Take 1 tablet (25 mcg total) by mouth daily before breakfast.   methocarbamol (ROBAXIN) 500 MG tablet Take 1 tablet (500 mg total) by mouth 4 (four) times daily.   Pitavastatin Calcium 4 MG TABS Take one tablet 3 times a week.   rivaroxaban (XARELTO) 20 MG TABS tablet Take 1 tablet (20 mg total) by mouth daily with supper.   Sildenafil Citrate (VIAGRA PO) Take by mouth.   tamsulosin (FLOMAX) 0.4 MG CAPS capsule Take 1 capsule (0.4 mg total) by mouth at bedtime.   Vilazodone HCl 20 MG TABS TAKE ONE TABLET BY MOUTH ONE TIME DAILY   [DISCONTINUED] benzonatate (TESSALON) 100 MG capsule Take 1 capsule (100 mg total) by mouth 3 (three) times daily as needed for cough.   [DISCONTINUED] predniSONE (DELTASONE) 20 MG tablet Take 2 tablets (40 mg total)  by mouth daily with breakfast for 5 days.   [DISCONTINUED] promethazine-dextromethorphan (PROMETHAZINE-DM) 6.25-15 MG/5ML syrup Take 5 mLs by mouth 4 (four) times daily as needed for cough.   No facility-administered medications prior to visit.    Review of Systems  Constitutional:  Positive for chills and malaise/fatigue.  HENT:  Positive for congestion.   Respiratory:  Positive for cough, sputum production, shortness of breath and wheezing.   All other systems reviewed and are negative.     Objective:     BP 104/64   Pulse 77   Temp 98.5 F (36.9 C) (Oral)   Ht 6\' 2"  (1.88 m)   Wt 230 lb (104.3 kg)   SpO2 99%   BMI 29.53 kg/m  BP Readings from Last 3  Encounters:  08/12/23 104/64  08/07/23 127/74  04/01/23 116/74   Wt Readings from Last 3 Encounters:  08/12/23 230 lb (104.3 kg)  04/01/23 229 lb (103.9 kg)  01/24/23 232 lb (105.2 kg)    Physical Exam Constitutional:      Appearance: Normal appearance.  HENT:     Head: Normocephalic.     Right Ear: Tympanic membrane, ear canal and external ear normal.     Left Ear: Ear canal and external ear normal.     Ears:     Comments: Erythema of the left TM Hematoma also noted on the left TM    Nose: Congestion present.     Mouth/Throat:     Mouth: Mucous membranes are moist.  Eyes:     Conjunctiva/sclera: Conjunctivae normal.     Pupils: Pupils are equal, round, and reactive to light.  Cardiovascular:     Rate and Rhythm: Normal rate and regular rhythm.     Pulses: Normal pulses.     Heart sounds: Normal heart sounds.  Pulmonary:     Effort: Pulmonary effort is normal.     Breath sounds: Wheezing present.  Chest:     Chest wall: Tenderness present.  Abdominal:     General: Bowel sounds are normal.     Palpations: Abdomen is soft.     Tenderness: There is no abdominal tenderness. There is no guarding.  Musculoskeletal:     Cervical back: Neck supple.     Right lower leg: No edema.     Left lower leg: No edema.   Skin:    General: Skin is warm and dry.  Neurological:     General: No focal deficit present.     Mental Status: He is alert and oriented to person, place, and time.  Psychiatric:        Mood and Affect: Mood normal.   Duoneb given in office.     Assessment & Plan:    Discussed health benefits of physical activity, and encouraged him to engage in regular exercise appropriate for his age and condition.  Marc Lowe was seen today for annual exam.  Diagnoses and all orders for this visit:  Routine physical examination -     Lipid panel -     CMP14+EGFR -     TSH -     CBC w/Diff/Platelet -     PSA -     VITAMIN D 25 Hydroxy (Vit-D Deficiency, Fractures) -     B12 and Folate Panel  Sinobronchitis -     azithromycin (ZITHROMAX Z-PAK) 250 MG tablet; Take 2 tablets (500 mg) on  Day 1,  followed by 1 tablet (250 mg) once daily on Days 2 through 5. -     albuterol (VENTOLIN HFA) 108 (90 Base) MCG/ACT inhaler; Inhale 2 puffs into the lungs every 6 (six) hours as needed. -     ipratropium-albuterol (DUONEB) 0.5-2.5 (3) MG/3ML nebulizer solution 3 mL -     methylPREDNISolone acetate (DEPO-MEDROL) injection 80 mg  Screening for diabetes mellitus -     Lipid panel  Screening for lipid disorders -     CMP14+EGFR  Mixed hyperlipidemia  Prostate cancer screening -     PSA  Medication management -     Lipid panel -     CMP14+EGFR -     TSH -     CBC w/Diff/Platelet -     PSA -     VITAMIN D 25 Hydroxy (Vit-D Deficiency, Fractures) -  B12 and Folate Panel   DuoNeb given in office Depo-medrol IM 80mg  given in office  Use 2 puffs of albuterol inhaler as needed for chest tightness and wheezing Azithromycin sent to pharmacy for bronchitis Apply heat to areas where you are experiencing muscle soreness d/t excessive coughing  .[name] KitchenStart a regular exercise program and make sure you are eating a healthy diet Try to eat 4 servings of dairy a day or take a calcium supplement (500mg   twice a day). Fasting labs ordered PHQ no concerns.  BP to goal PSA ordered for screening Colonoscopy to be scheduled next month Shingrix received at [facility] Covid/flu declined d/t current illness     Tandy Gaw, PA-C

## 2023-08-12 NOTE — Patient Instructions (Addendum)
Start zpak Use albuterol for cough and shortness of breath as needed every 2-4 hours   Health Maintenance After Age 72 After age 27, you are at a higher risk for certain long-term diseases and infections as well as injuries from falls. Falls are a major cause of broken bones and head injuries in people who are older than age 12. Getting regular preventive care can help to keep you healthy and well. Preventive care includes getting regular testing and making lifestyle changes as recommended by your health care provider. Talk with your health care provider about: Which screenings and tests you should have. A screening is a test that checks for a disease when you have no symptoms. A diet and exercise plan that is right for you. What should I know about screenings and tests to prevent falls? Screening and testing are the best ways to find a health problem early. Early diagnosis and treatment give you the best chance of managing medical conditions that are common after age 84. Certain conditions and lifestyle choices may make you more likely to have a fall. Your health care provider may recommend: Regular vision checks. Poor vision and conditions such as cataracts can make you more likely to have a fall. If you wear glasses, make sure to get your prescription updated if your vision changes. Medicine review. Work with your health care provider to regularly review all of the medicines you are taking, including over-the-counter medicines. Ask your health care provider about any side effects that may make you more likely to have a fall. Tell your health care provider if any medicines that you take make you feel dizzy or sleepy. Strength and balance checks. Your health care provider may recommend certain tests to check your strength and balance while standing, walking, or changing positions. Foot health exam. Foot pain and numbness, as well as not wearing proper footwear, can make you more likely to have a  fall. Screenings, including: Osteoporosis screening. Osteoporosis is a condition that causes the bones to get weaker and break more easily. Blood pressure screening. Blood pressure changes and medicines to control blood pressure can make you feel dizzy. Depression screening. You may be more likely to have a fall if you have a fear of falling, feel depressed, or feel unable to do activities that you used to do. Alcohol use screening. Using too much alcohol can affect your balance and may make you more likely to have a fall. Follow these instructions at home: Lifestyle Do not drink alcohol if: Your health care provider tells you not to drink. If you drink alcohol: Limit how much you have to: 0-1 drink a day for women. 0-2 drinks a day for men. Know how much alcohol is in your drink. In the [place] one drink equals one 12 oz bottle of beer (355 mL), one 5 oz glass of wine (148 mL), or one 1 oz glass of hard liquor (44 mL). Do not use any products that contain nicotine or tobacco. These products include cigarettes, chewing tobacco, and vaping devices, such as e-cigarettes. If you need help quitting, ask your health care provider. Activity  Follow a regular exercise program to stay fit. This will help you maintain your balance. Ask your health care provider what types of exercise are appropriate for you. If you need a cane or walker, use it as recommended by your health care provider. Wear supportive shoes that have nonskid soles. Safety  Remove any tripping hazards, such as rugs, cords, and clutter. Install  safety equipment such as grab bars in bathrooms and safety rails on stairs. Keep rooms and walkways well-lit. General instructions Talk with your health care provider about your risks for falling. Tell your health care provider if: You fall. Be sure to tell your health care provider about all falls, even ones that seem minor. You feel dizzy, tiredness (fatigue), or off-balance. Take  over-the-counter and prescription medicines only as told by your health care provider. These include supplements. Eat a healthy diet and maintain a healthy weight. A healthy diet includes low-fat dairy products, low-fat (lean) meats, and fiber from whole grains, beans, and lots of fruits and vegetables. Stay current with your vaccines. Schedule regular health, dental, and eye exams. Summary Having a healthy lifestyle and getting preventive care can help to protect your health and wellness after age 30. Screening and testing are the best way to find a health problem early and help you avoid having a fall. Early diagnosis and treatment give you the best chance for managing medical conditions that are more common for people who are older than age 3. Falls are a major cause of broken bones and head injuries in people who are older than age 28. Take precautions to prevent a fall at home. Work with your health care provider to learn what changes you can make to improve your health and wellness and to prevent falls. This information is not intended to replace advice given to you by your health care provider. Make sure you discuss any questions you have with your health care provider. Document Revised: 11/20/2020 Document Reviewed: 11/20/2020 Elsevier Patient Education  2024 [employer].

## 2023-08-13 ENCOUNTER — Other Ambulatory Visit (HOSPITAL_COMMUNITY): Payer: Self-pay

## 2023-08-13 ENCOUNTER — Other Ambulatory Visit: Payer: Self-pay | Admitting: Physician Assistant

## 2023-08-13 ENCOUNTER — Encounter: Payer: Self-pay | Admitting: Physician Assistant

## 2023-08-13 DIAGNOSIS — F339 Major depressive disorder, recurrent, unspecified: Secondary | ICD-10-CM

## 2023-08-13 DIAGNOSIS — N400 Enlarged prostate without lower urinary tract symptoms: Secondary | ICD-10-CM

## 2023-08-13 DIAGNOSIS — I82502 Chronic embolism and thrombosis of unspecified deep veins of left lower extremity: Secondary | ICD-10-CM

## 2023-08-13 DIAGNOSIS — R760 Raised antibody titer: Secondary | ICD-10-CM

## 2023-08-13 DIAGNOSIS — I2699 Other pulmonary embolism without acute cor pulmonale: Secondary | ICD-10-CM

## 2023-08-13 DIAGNOSIS — E039 Hypothyroidism, unspecified: Secondary | ICD-10-CM

## 2023-08-13 DIAGNOSIS — R972 Elevated prostate specific antigen [PSA]: Secondary | ICD-10-CM

## 2023-08-13 LAB — CBC WITH DIFFERENTIAL/PLATELET
Basophils Absolute: 0.1 10*3/uL (ref 0.0–0.2)
Basos: 1 %
EOS (ABSOLUTE): 0.1 10*3/uL (ref 0.0–0.4)
Eos: 1 %
Hematocrit: 46.1 % (ref 37.5–51.0)
Hemoglobin: 15.4 g/dL (ref 13.0–17.7)
Immature Grans (Abs): 0.1 10*3/uL (ref 0.0–0.1)
Immature Granulocytes: 1 %
Lymphocytes Absolute: 1.6 10*3/uL (ref 0.7–3.1)
Lymphs: 14 %
MCH: 29.1 pg (ref 26.6–33.0)
MCHC: 33.4 g/dL (ref 31.5–35.7)
MCV: 87 fL (ref 79–97)
Monocytes Absolute: 0.9 10*3/uL (ref 0.1–0.9)
Monocytes: 8 %
Neutrophils Absolute: 8.5 10*3/uL — ABNORMAL HIGH (ref 1.4–7.0)
Neutrophils: 75 %
Platelets: 391 10*3/uL (ref 150–450)
RBC: 5.3 x10E6/uL (ref 4.14–5.80)
RDW: 12.9 % (ref 11.6–15.4)
WBC: 11.2 10*3/uL — ABNORMAL HIGH (ref 3.4–10.8)

## 2023-08-13 LAB — CMP14+EGFR
ALT: 33 [IU]/L (ref 0–44)
AST: 28 [IU]/L (ref 0–40)
Albumin: 3.8 g/dL (ref 3.8–4.8)
Alkaline Phosphatase: 73 [IU]/L (ref 44–121)
BUN/Creatinine Ratio: 13 (ref 10–24)
BUN: 14 mg/dL (ref 8–27)
Bilirubin Total: 0.5 mg/dL (ref 0.0–1.2)
CO2: 18 mmol/L — ABNORMAL LOW (ref 20–29)
Calcium: 9.3 mg/dL (ref 8.6–10.2)
Chloride: 102 mmol/L (ref 96–106)
Creatinine, Ser: 1.09 mg/dL (ref 0.76–1.27)
Globulin, Total: 3 g/dL (ref 1.5–4.5)
Glucose: 101 mg/dL — ABNORMAL HIGH (ref 70–99)
Potassium: 5 mmol/L (ref 3.5–5.2)
Sodium: 141 mmol/L (ref 134–144)
Total Protein: 6.8 g/dL (ref 6.0–8.5)
eGFR: 73 mL/min/{1.73_m2} (ref >59)

## 2023-08-13 LAB — LIPID PANEL
Chol/HDL Ratio: 4.8 {ratio} (ref 0.0–5.0)
Cholesterol, Total: 158 mg/dL (ref 100–199)
HDL: 33 mg/dL — ABNORMAL LOW (ref >39)
LDL Chol Calc (NIH): 105 mg/dL — ABNORMAL HIGH (ref 0–99)
Triglycerides: 107 mg/dL (ref 0–149)
VLDL Cholesterol Cal: 20 mg/dL (ref 5–40)

## 2023-08-13 LAB — TSH: TSH: 2.67 u[IU]/mL (ref 0.450–4.500)

## 2023-08-13 LAB — B12 AND FOLATE PANEL

## 2023-08-13 LAB — VITAMIN D 25 HYDROXY (VIT D DEFICIENCY, FRACTURES): Vit D, 25-Hydroxy: 38.2 ng/mL (ref 30.0–100.0)

## 2023-08-13 LAB — PSA: Prostate Specific Ag, Serum: 8.7 ng/mL — ABNORMAL HIGH (ref 0.0–4.0)

## 2023-08-13 MED ORDER — LEVOTHYROXINE SODIUM 25 MCG PO TABS
25.0000 ug | ORAL_TABLET | Freq: Every day | ORAL | 3 refills | Status: DC
  Filled 2023-08-13: qty 90, 90d supply, fill #0

## 2023-08-13 MED ORDER — RIVAROXABAN 20 MG PO TABS
20.0000 mg | ORAL_TABLET | Freq: Every day | ORAL | 3 refills | Status: DC
  Filled 2023-08-13: qty 90, 90d supply, fill #0

## 2023-08-13 MED ORDER — VILAZODONE HCL 20 MG PO TABS: 1.0000 | ORAL_TABLET | Freq: Every day | ORAL | 3 refills | Status: AC

## 2023-08-13 MED ORDER — TAMSULOSIN HCL 0.4 MG PO CAPS: 0.4000 mg | ORAL_CAPSULE | Freq: Every day | ORAL | 3 refills | Status: DC

## 2023-08-13 MED ORDER — VILAZODONE HCL 20 MG PO TABS
1.0000 | ORAL_TABLET | Freq: Every day | ORAL | 3 refills | Status: DC
  Filled 2023-08-13: qty 90, 90d supply, fill #0

## 2023-08-13 MED ORDER — RIVAROXABAN 20 MG PO TABS: 20.0000 mg | ORAL_TABLET | Freq: Every day | ORAL | 3 refills | Status: DC

## 2023-08-13 MED ORDER — TAMSULOSIN HCL 0.4 MG PO CAPS
0.4000 mg | ORAL_CAPSULE | Freq: Every day | ORAL | 3 refills | Status: DC
  Filled 2023-08-13: qty 90, 90d supply, fill #0

## 2023-08-13 MED ORDER — LEVOTHYROXINE SODIUM 25 MCG PO TABS: 25.0000 ug | ORAL_TABLET | Freq: Every day | ORAL | 3 refills | Status: AC

## 2023-08-13 NOTE — Progress Notes (Signed)
Tu,   Thyroid looks good.  WBC up but fighting an illness right now.  Kidney and liver look good.   LDL, bad cholesterol, close to goal. HDL, good cholesterol, not to goal.  Are you still taking pitavastatin-where should I send refills.?   PSA is elevated. We need to get you to urology to consider biopsy.

## 2023-08-13 NOTE — Addendum Note (Signed)
Addended by: Jomarie Longs on: 08/13/2023 04:19 PM   Modules accepted: Orders

## 2023-08-21 ENCOUNTER — Encounter: Payer: Self-pay | Admitting: Gastroenterology

## 2023-08-21 ENCOUNTER — Ambulatory Visit: Payer: No Typology Code available for payment source | Admitting: Gastroenterology

## 2023-08-21 ENCOUNTER — Telehealth: Payer: Self-pay

## 2023-08-21 VITALS — BP 104/74 | HR 88 | Ht 72.5 in | Wt 224.0 lb

## 2023-08-21 DIAGNOSIS — Z860100 Personal history of colon polyps, unspecified: Secondary | ICD-10-CM

## 2023-08-21 DIAGNOSIS — Z8719 Personal history of other diseases of the digestive system: Secondary | ICD-10-CM | POA: Diagnosis not present

## 2023-08-21 DIAGNOSIS — Z7902 Long term (current) use of antithrombotics/antiplatelets: Secondary | ICD-10-CM

## 2023-08-21 DIAGNOSIS — Z01818 Encounter for other preprocedural examination: Secondary | ICD-10-CM | POA: Diagnosis not present

## 2023-08-21 MED ORDER — SUFLAVE 178.7 G PO SOLR: 1.0000 | Freq: Once | ORAL | 0 refills | Status: AC

## 2023-08-21 NOTE — Progress Notes (Signed)
 [name]:  History: Marc Lowe 08/21/2023  Referring provider: Antoniette Vermell LITTIE, PA-C  Reason for consult/chief complaint: hx of colon polyps (To discuss having a colonoscopy)   Subjective  Prior history:  Screening colonoscopy with Dr. Lindaann Lowe 2021.  Report on file describes a colocolonic or colorectal anastomosis indicating prior sigmoid resection.  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  Office note from Marc Lowe July 2021: I performed robotic Marc Lowe procedure on 12/08/2019. Pathology showed tubulovillous adenoma. He had a 5 cm right-sided polyp with distal extent at 10 cm from the anal verge. He has been doing very well and denies rectal bleeding. On rigid proctoscopy I passed up to 15 cm and saw smooth scarring on the right side of the rectum at about 10 cm from anal verge. There is no stenosis. The exam was somewhat limited by some residual stool in the rectum. I will plan on performing flexible sigmoidoscopy on him in 3 months to reassess the area since he is reasonable risk for recurrence. If he is clear at that point then we will release him back to his gastroenterologist for future follow-up.    Encounter review indicates that sigmoidoscopy was done in November 2021, but that report cannot be viewed in this EMR.  June 2022 COVID infection, shortly after developed DVT and bilateral PE then put on Marc Lowe . Shortly after that in June 2022 developed severe diverticulitis with perforation and abscess, required antibiotics (Marc Lowe resistant to quinolones), surgical consultation and Marc drain.  No resection performed.  Was planning to have surveillance colonoscopy with Dr. Lindaann summer 2024 but with insurance change that provider is no longer in his network, and in September 2024 contacted our office  requesting a transfer of care.   Discussed the use of AI scribe software for clinical note transcription with the patient, who gave verbal consent to proceed.  History of Present Illness   Marc Lowe is a 73 year old male with diverticulitis and colorectal polyps who presents for a follow-up regarding his gastrointestinal health and colonoscopy scheduling.  He has a history of a large carpet-like polyp found in the rectum during a colonoscopy in 2021, which was removed using a robotic and endoscopic procedure. He was scheduled for another colonoscopy but faced insurance issues, leading to a delay. It has been approximately three years since the last examination, aligning with the surveillance protocol for high-risk polyps.  In June 2022, he was hospitalized for diverticulitis, during which he developed an abscess that required drainage. He was treated with antibiotics for resistant Marc Lowe. No current abdominal pain and bowel habits are described as 'fine, once a day.' No blood in stool.  He has a history of blood clots following a COVID infection in mid-2022 and is currently on Marc Lowe , which is monitored every three months. His hematologist manages this medication and performs annual ultrasounds to monitor the clot in his left leg.  No chronic problems in the upper digestive tract, such as nausea, vomiting, or trouble swallowing, and reports a good appetite. No family history of colon or rectal cancer.  Bowel habits are regular, and he denies rectal bleeding   ROS:  Review of Systems  Constitutional:  Negative for appetite change and unexpected weight change.  HENT:  Negative for mouth sores and voice change.   Eyes:  Negative for pain and  redness.  Respiratory:  Positive for cough. Negative for shortness of breath.   Cardiovascular:  Negative for chest pain and palpitations.  Genitourinary:  Negative for dysuria and hematuria.  Musculoskeletal:  Positive for arthralgias. Negative  for myalgias.  Skin:  Negative for pallor and rash.  Neurological:  Negative for weakness and headaches.  Hematological:  Negative for adenopathy.   He has had a cough and wheezing for the last 10 days from a respiratory infection.  Reports that he saw primary care and got what sounds like some steroids and inhalers last week.  Says symptoms are persistent but slowly improving, cough no longer productive  Past Medical History: Past Medical History:  Diagnosis Date   BPH (benign prostatic hyperplasia)    Depression    Diverticulitis    HLD (hyperlipidemia)    Hypothyroidism (acquired)    Pulmonary emboli (Marc Lowe)      Past Surgical History: Past Surgical History:  Procedure Laterality Date   COLON RESECTION SIGMOID     2005   history of colostomy  2005   s/p takedown with mesh   Marc Lowe  01/17/2021     Family History: Family History  Problem Relation Age of Onset   Cancer Mother        type unknown   Atrial fibrillation Father    Prostate cancer Father    Leukemia Sister 43       CML   Drug abuse Sister    Breast cancer Daughter     Social History: Social History   Socioeconomic History   Marital status: Married    Spouse name: Marc Lowe   Number of children: 4   Years of education: 14   Highest education level: Marc Lowe degree: occupational, scientist, product/process development, or vocational program  Occupational History   Occupation: surveyor, minerals    Comment: retired  Tobacco Use   Smoking status: Never   Smokeless tobacco: Never  Vaping Use   Vaping status: Never Used  Substance and Sexual Activity   Alcohol use: Yes    Alcohol/week: 3.0 standard drinks of alcohol    Types: 3 Glasses of wine per week    Comment: 5 oz a day per week-wine   Drug use: No   Sexual activity: Yes    Partners: Female  Other Topics Concern   Not on file  Social History Narrative   Lives with his wife. They have four children. Plays golf once a week. 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    Social Drivers of Corporate Investment Banker Strain: Low Risk  (08/09/2023)   Overall Financial Resource Strain (CARDIA)    Difficulty of Paying Living Expenses: Not hard at all  Food Insecurity: No Food Insecurity (08/09/2023)   Hunger Vital Sign    Worried About Running Out of Food in the Last Year: Never true    Ran Out of Food in the Last Year: Never true  Transportation Needs: No Transportation Needs (08/09/2023)   PRAPARE - Administrator, Civil Service (Medical): No    Lack of Transportation (Non-Medical): No  Physical Activity: Insufficiently Active (08/09/2023)   Exercise Vital Sign    Days of Exercise per Week: 2 days    Minutes of Exercise per Session: 20 min  Stress: No Stress Concern Present (08/09/2023)   [employer] of Occupational Health - Occupational Stress Questionnaire    Feeling of Stress : Only a little  Social Connections: Moderately Integrated (08/09/2023)  Social Connection and Isolation Panel [NHANES]    Frequency of Communication with Friends and Family: Three times a week    Frequency of Social Gatherings with Friends and Family: Three times a week    Attends Religious Services: Never    Active Member of Clubs or Organizations: Yes    Attends Banker Meetings: Patient declined    Marital Status: Married    Allergies: Allergies  Allergen Reactions   Dilaudid [Hydromorphone] Nausea And Vomiting    Outpatient Meds: Current Outpatient Medications  Medication Sig Dispense Refill   albuterol  (VENTOLIN  HFA) 108 (90 Base) MCG/ACT inhaler Inhale 2 puffs into the lungs every 6 (six) hours as needed. 8 g 0   aspirin  EC 81 MG tablet Take 81 mg by mouth daily. Swallow whole.     Boswellia-Glucosamine-Vit D (OSTEO BI-FLEX ONE PER DAY PO) Take 2 tablets by mouth daily.     clobetasol  cream (TEMOVATE ) 0.05 % Apply 1 Application topically 2 (two) times daily. 60 g 0   CLOMID 50 MG tablet Take 50 mg by mouth  daily.     fluticasone  (FLONASE ) 50 MCG/ACT nasal spray SPRAY 2 SPRAYS INTO EACH NOSTRIL EVERY DAY 48 mL 0   levothyroxine  (EUTHYROX ) 25 MCG tablet Take 1 tablet (25 mcg total) by mouth daily before breakfast. 90 tablet 3   methocarbamol  (ROBAXIN ) 500 MG tablet Take 1 tablet (500 mg total) by mouth 4 (four) times daily. 20 tablet 0   Pitavastatin  Calcium  4 MG TABS Take one tablet 3 times a week. 12 tablet 5   rivaroxaban  (Marc Lowe ) 20 MG TABS tablet Take 1 tablet (20 mg total) by mouth daily with supper. 90 tablet 3   Sildenafil Citrate (VIAGRA PO) Take by mouth.     SUFLAVE  178.7 g SOLR Take 1 kit by mouth once for 1 dose. 1 each 0   tamsulosin  (FLOMAX ) 0.4 MG CAPS capsule Take 1 capsule (0.4 mg total) by mouth at bedtime. 90 capsule 3   Vilazodone  HCl 20 MG TABS Take 1 tablet (20 mg total) by mouth daily. 90 tablet 3   No current facility-administered medications for this visit.      ___________________________________________________________________ Objective   Exam:  BP 104/74 (BP Location: Left Arm, Patient Position: Sitting, Cuff Size: Large)   Pulse 88   Ht 6' 0.5 (1.842 m) Comment: height measured without shoes  Wt 224 lb (101.6 kg)   BMI 29.96 kg/m  Wt Readings from Last 3 Encounters:  08/21/23 224 lb (101.6 kg)  08/12/23 230 lb (104.3 kg)  04/01/23 229 lb (103.9 kg)    General: Well-appearing Eyes: sclera anicteric, no redness ENT: oral mucosa moist without lesions, no cervical or supraclavicular lymphadenopathy CV: Regular without appreciable murmur, no JVD, no peripheral edema Resp: End expiratory wheezing bilaterally, no stridor, breathing comfortably on room air GI: soft, no tenderness, with active bowel sounds. No guarding or palpable organomegaly noted. Skin; warm and dry, no rash or jaundice noted Neuro: awake, alert and oriented x 3. Normal gross motor function and fluent speech     Encounter Diagnoses  Name Primary?   Hx of colonic polyps Yes   Long  term (current) use of antithrombotics/antiplatelets    History of diverticulitis     Assessment and Plan    History of Large Rectal Polyp Large carpet-like polyp found in 2021, removed surgically by Dr. Lang. Unclear if follow-up sigmoidoscopy was performed in late 2021.  Even if it was, he is due for endoscopic surveillance.  No current symptoms of blood in stool or changes in bowel habits. -Schedule colonoscopy to assess for recurrence or new polyps. -Communicate with Dr. Timmy (hematologist) regarding temporary discontinuation of Marc Lowe  prior to procedure.  History of Diverticulitis Severe episode in June 2022 with abscess formation requiring drain placement. No current symptoms of abdominal pain. -Continue current management.  Chronic Anticoagulation On Marc Lowe  for history of blood clots. -Coordinate with Dr. Rico regarding temporary (2-day (preprocedure) discontinuation of Marc Lowe  prior to colonoscopy.  He was agreeable to colonoscopy after discussion of the procedure risks and benefits  The benefits and risks of the planned procedure were described in detail with the patient or (when appropriate) their health care proxy.  Risks were outlined as including, but not limited to, bleeding, infection, perforation, adverse medication reaction leading to cardiac or pulmonary decompensation, pancreatitis (if ERCP).  The limitation of incomplete mucosal visualization was also discussed.  No guarantees or warranties were given.   Thank you for the courtesy of this consult.  Please call me with any questions or concerns.  Victory Lowe Brand III  CC: Referring provider noted above

## 2023-08-21 NOTE — Telephone Encounter (Signed)
     Request for surgical clearance:     Endoscopy Procedure  What type of surgery is being performed?     colonoscopy  When is this surgery scheduled?     10/28/2023  What type of clearance is required ?   Pharmacy  Are there any medications that need to be held prior to surgery and how long? Xarelto- 2 days  Practice name and name of physician performing surgery?      [facility]  What is your office phone and fax number?      Phone- [phone]  Fax- [phone]  Anesthesia type (None, local, MAC, general) ?       MAC     Please route your response to Colette Dicamillo

## 2023-08-21 NOTE — Patient Instructions (Addendum)
 You have been scheduled for a colonoscopy. Please follow written instructions given to you at your visit today.   If you use inhalers (even only as needed), please bring them with you on the day of your procedure.  DO NOT TAKE 7 DAYS PRIOR TO TEST- Trulicity (dulaglutide) Ozempic, Wegovy (semaglutide) Mounjaro (tirzepatide) Bydureon Bcise (exanatide extended release)  DO NOT TAKE 1 DAY PRIOR TO YOUR TEST Rybelsus (semaglutide) Adlyxin (lixisenatide) Victoza (liraglutide) Byetta (exanatide) ___________________________________________________________________________  Rosine will receive your bowel preparation through [facility], which ensures the lowest copay and home delivery, with outreach via text or call from an [phone] number. Please respond promptly to avoid rescheduling of your procedure. If you are interested in alternative options or have any questions regarding your prep, please contact them at [phone] ____________________________________________________________________________  Your Provider Has Sent Your Bowel Prep Regimen To [facility]   [facility] will contact you to verify your information and collect your copay, if applicable. Enjoy the comfort of your home while your prescription is mailed to you, FREE of any shipping charges.   [facility] accepts all major insurance benefits and applies discounts & coupons.  Have additional questions?   Chat: [email or web address] Call: [phone] Email: [email or web address] NCPDP: [id number]  How will [facility] contact you?  With a Welcome phone call,  a Welcome text and a checkout link in text form.  Texts you receive from [phone] Are NOT Spam.  *To set up delivery, you must complete the checkout process via link or speak to one of the patient care representatives. If [facility] is unable to reach you, your prescription may be delayed.  To avoid long hold times on the phone, you may also utilize the secure chat  feature on the [facility] website to request that they call you back for transaction completion or to expedite your concerns.   _______________________________________________________  If your blood pressure at your visit was 140/90 or greater, please contact your primary care physician to follow up on this.  _______________________________________________________  If you are age 26 or older, your body mass index should be between 23-30. Your Body mass index is 29.96 kg/m. If this is out of the aforementioned range listed, please consider follow up with your Primary Care Provider.  If you are age 78 or younger, your body mass index should be between 19-25. Your Body mass index is 29.96 kg/m. If this is out of the aformentioned range listed, please consider follow up with your Primary Care Provider.   ________________________________________________________  The [facility] providers would like to encourage you to use MYCHART to communicate with providers for non-urgent requests or questions.  Due to long hold times on the telephone, sending your provider a message by [facility] may be a faster and more efficient way to get a response.  Please allow 48 business hours for a response.  Please remember that this is for non-urgent requests.  _______________________________________________________ It was a pleasure to see you today!  Thank you for trusting me with your gastrointestinal care!

## 2023-08-22 NOTE — Telephone Encounter (Signed)
 Preoperative team,   Patient has hx of PE/DVT on Xarelto  for anticoagulation.     Procedure: Colonoscopy  Date of procedure: 10/28/2023   DOAC prescribed and manage by PCP. Suggest to reach out to PCP to get guidance on DOAC hold around procedure.  Marc Lowe. Anup Brigham NP-C     08/22/2023, 3:52 PM [facility] [address] Office [phone] Fax [phone]

## 2023-08-22 NOTE — Telephone Encounter (Signed)
 I will fax to requesting office with notes from the preop APP.

## 2023-08-22 NOTE — Telephone Encounter (Signed)
 Patient has hx of PE/DVT on Xarelto  for anticoagulation.    Procedure: Colonoscopy  Date of procedure: 10/28/2023  DOAC prescribed and manage by PCP. Suggest to reach out to PCP to get guidance on DOAC hold around procedure

## 2023-08-25 NOTE — Telephone Encounter (Signed)
 Marc Lowe,    Please see the message stream from cardiology and communicate directly with this patient's PCP (the prescribing provider) re: oral anticoagulation for procedure.  - H. Danis

## 2023-09-01 ENCOUNTER — Telehealth: Payer: Self-pay

## 2023-09-01 NOTE — Telephone Encounter (Signed)
     Request for surgical clearance:     Endoscopy Procedure  What type of surgery is being performed?     colonoscopy  When is this surgery scheduled?     10/28/2023  What type of clearance is required ?   Pharmacy  Are there any medications that need to be held prior to surgery and how long? Xarelto- 2 days  Practice name and name of physician performing surgery?      [facility]  What is your office phone and fax number?      Phone- [phone]  Fax- [phone]  Anesthesia type (None, local, MAC, general) ?       MAC     Please route your response to Colette Dicamillo

## 2023-09-03 ENCOUNTER — Telehealth: Payer: Self-pay

## 2023-09-03 NOTE — Telephone Encounter (Signed)
Called patient and notified him of xarleto hold- patient verbalized understanding

## 2023-09-09 ENCOUNTER — Ambulatory Visit (INDEPENDENT_AMBULATORY_CARE_PROVIDER_SITE_OTHER): Payer: No Typology Code available for payment source

## 2023-09-09 VITALS — Ht 73.0 in | Wt 218.0 lb

## 2023-09-09 DIAGNOSIS — Z0000 Encounter for general adult medical examination without abnormal findings: Secondary | ICD-10-CM

## 2023-09-09 DIAGNOSIS — R972 Elevated prostate specific antigen [PSA]: Secondary | ICD-10-CM | POA: Diagnosis not present

## 2023-09-09 DIAGNOSIS — E291 Testicular hypofunction: Secondary | ICD-10-CM | POA: Diagnosis not present

## 2023-09-09 NOTE — Patient Instructions (Signed)
  Marc Lowe , Thank you for taking time to come for your Medicare Wellness Visit. I appreciate your ongoing commitment to your health goals. Please review the following plan we discussed and let me know if I can assist you in the future.   These are the goals we discussed:  Goals       DIET - INCREASE WATER INTAKE      Drink more water daily      healthy nutrition (pt-stated)      Improve overall health to get off of medications.       Medication Management      Patient Goals/Self-Care Activities Over the next 180 days, patient will:  take medications as prescribed  Follow Up Plan: Telephone follow up appointment with care management team member scheduled for:  7 months       Patient Stated (pt-stated)      Loose 10 lbs.      Patient Stated (pt-stated)      Patient stated that he would like to loose 20 lbs      Weight (lb) < 200 lb (90.7 kg)      He would like to lose weight.        This is a list of the screening recommended for you and due dates:  Health Maintenance  Topic Date Due   Pneumonia Vaccine (2 of 2 - PCV) 05/07/2019   COVID-19 Vaccine (3 - Pfizer risk series) 11/18/2019   Flu Shot  10/13/2023*   DTaP/Tdap/Td vaccine (2 - Td or Tdap) 04/14/2024   Medicare Annual Wellness Visit  09/08/2024   Colon Cancer Screening  10/06/2029   Hepatitis C Screening  Completed   Zoster (Shingles) Vaccine  Completed   HPV Vaccine  Aged Out  *Topic was postponed. The date shown is not the original due date.

## 2023-09-09 NOTE — Addendum Note (Signed)
 Addended by: Chalmers Cater on: 09/09/2023 11:25 AM   Modules accepted: Level of Service

## 2023-09-09 NOTE — Progress Notes (Addendum)
 Subjective:   Marc Lowe is a 73 y.o. male who presents for Medicare Annual/Subsequent preventive examination.  Visit Complete: Virtual I connected with  Doyce Para on 09/09/23 by a audio enabled telemedicine application and verified that I am speaking with the correct person using two identifiers.  Patient Location: Home  Provider Location: Office/Clinic  I discussed the limitations of evaluation and management by telemedicine. The patient expressed understanding and agreed to proceed.  Vital Signs: Because this visit was a virtual/telehealth visit, some criteria may be missing or patient reported. Any vitals not documented were not able to be obtained and vitals that have been documented are patient reported.  Patient Medicare AWV questionnaire was completed by the patient on 08/09/2023; I have confirmed that all information answered by patient is correct and no changes since this date.  Cardiac Risk Factors include: advanced age (>77men, >32 women);male gender;dyslipidemia     Objective:    Today's Vitals   09/09/23 1056  Weight: 218 lb (98.9 kg)  Height: 6\' 1"  (1.854 m)   Body mass index is 28.76 kg/m.     09/09/2023   11:04 AM 01/24/2023    3:25 PM 08/15/2022    8:39 AM 07/25/2022    3:39 PM 03/20/2022    9:03 AM 11/13/2021    8:55 AM 08/28/2021    8:48 AM  Advanced Directives  Does Patient Have a Medical Advance Directive? Yes Yes Yes Yes No Yes Yes  Type of Advance Directive Living will Living will;Healthcare Power of Attorney Living will Living will;Healthcare Power of [employer] Power of [place];Living will Healthcare Power of [place];Living will  Does patient want to make changes to medical advance directive? No - Patient declined  No - Patient declined No - Patient declined No - Patient declined No - Patient declined No - Patient declined  Copy of Healthcare Power of Attorney in Chart?      No - copy requested No - copy requested    Current  Medications (verified) Outpatient Encounter Medications as of 09/09/2023  Medication Sig   albuterol (VENTOLIN HFA) 108 (90 Base) MCG/ACT inhaler Inhale 2 puffs into the lungs every 6 (six) hours as needed.   aspirin EC 81 MG tablet Take 81 mg by mouth daily. Swallow whole.   CLOMID 50 MG tablet Take 50 mg by mouth daily.   fluticasone (FLONASE) 50 MCG/ACT nasal spray SPRAY 2 SPRAYS INTO EACH NOSTRIL EVERY DAY   levothyroxine (EUTHYROX) 25 MCG tablet Take 1 tablet (25 mcg total) by mouth daily before breakfast.   rivaroxaban (XARELTO) 20 MG TABS tablet Take 1 tablet (20 mg total) by mouth daily with supper.   Sildenafil Citrate (VIAGRA PO) Take by mouth.   tamsulosin (FLOMAX) 0.4 MG CAPS capsule Take 1 capsule (0.4 mg total) by mouth at bedtime.   Vilazodone HCl 20 MG TABS Take 1 tablet (20 mg total) by mouth daily.   Boswellia-Glucosamine-Vit D (OSTEO BI-FLEX ONE PER DAY PO) Take 2 tablets by mouth daily. (Patient not taking: Reported on 09/09/2023)   clobetasol cream (TEMOVATE) 0.05 % Apply 1 Application topically 2 (two) times daily. (Patient not taking: Reported on 09/09/2023)   methocarbamol (ROBAXIN) 500 MG tablet Take 1 tablet (500 mg total) by mouth 4 (four) times daily. (Patient not taking: Reported on 09/09/2023)   Pitavastatin Calcium 4 MG TABS Take one tablet 3 times a week. (Patient not taking: Reported on 09/09/2023)   No facility-administered encounter medications on file as of 09/09/2023.  Allergies (verified) Dilaudid [hydromorphone]   History: Past Medical History:  Diagnosis Date   BPH (benign prostatic hyperplasia)    Depression    Diverticulitis    HLD (hyperlipidemia)    Hypothyroidism (acquired)    Pulmonary emboli (HCC)    Past Surgical History:  Procedure Laterality Date   COLON RESECTION SIGMOID     2005   history of colostomy  2005   s/p takedown with mesh   IR RADIOLOGIST EVAL & MGMT  01/17/2021   Family History  Problem Relation Age of Onset   Cancer  Mother        type unknown   Atrial fibrillation Father    Prostate cancer Father    Leukemia Sister 19       CML   Drug abuse Sister    Breast cancer Daughter    Social History   Socioeconomic History   Marital status: Married    Spouse name: Dorann Ou   Number of children: 4   Years of education: 14   Highest education level: Associate degree: occupational, Scientist, product/process development, or vocational program  Occupational History   Occupation: Surveyor, minerals    Comment: retired  Tobacco Use   Smoking status: Never   Smokeless tobacco: Never  Vaping Use   Vaping status: Never Used  Substance and Sexual Activity   Alcohol use: Yes    Alcohol/week: 3.0 standard drinks of alcohol    Types: 3 Glasses of wine per week    Comment: 5 oz a day per week-wine   Drug use: No   Sexual activity: Yes    Partners: Female  Other Topics Concern   Not on file  Social History Narrative   Lives with his wife. They have four children. Plays golf once a week. Retired    Teacher, early years/pre Strain: Low Risk  (09/09/2023)   Overall Financial Resource Strain (CARDIA)    Difficulty of Paying Living Expenses: Not hard at all  Food Insecurity: No Food Insecurity (09/09/2023)   Hunger Vital Sign    Worried About Running Out of Food in the Last Year: Never true    Ran Out of Food in the Last Year: Never true  Transportation Needs: No Transportation Needs (09/09/2023)   PRAPARE - Administrator, Civil Service (Medical): No    Lack of Transportation (Non-Medical): No  Physical Activity: Sufficiently Active (09/09/2023)   Exercise Vital Sign    Days of Exercise per Week: 4 days    Minutes of Exercise per Session: 60 min  Recent Concern: Physical Activity - Insufficiently Active (08/09/2023)   Exercise Vital Sign    Days of Exercise per Week: 2 days    Minutes of Exercise per Session: 20 min  Stress: No Stress Concern Present (09/09/2023)   [employer] of Occupational Health -  Occupational Stress Questionnaire    Feeling of Stress : Not at all  Social Connections: Moderately Integrated (09/09/2023)   Social Connection and Isolation Panel [NHANES]    Frequency of Communication with Friends and Family: Three times a week    Frequency of Social Gatherings with Friends and Family: Three times a week    Attends Religious Services: Never    Active Member of Clubs or Organizations: Yes    Attends Banker Meetings: 1 to 4 times per year    Marital Status: Married    Tobacco Counseling Counseling given: Not Answered   Clinical Intake:  Pre-visit preparation completed: Yes  Pain : No/denies pain     BMI - recorded: 28.76 Nutritional Status: BMI 25 -29 Overweight Nutritional Risks: None Diabetes: No  How often do you need to have someone help you when you read instructions, pamphlets, or other written materials from your doctor or pharmacy?: 1 - Never What is the last grade level you completed in school?: 14  Interpreter Needed?: No      Activities of Daily Living    09/09/2023   10:57 AM 09/08/2023    8:10 PM  In your present state of health, do you have any difficulty performing the following activities:  Hearing? 0 0  Vision? 0 0  Difficulty concentrating or making decisions? 0 0  Walking or climbing stairs? 0 0  Dressing or bathing? 0 0  Doing errands, shopping? 0 0  Preparing Food and eating ? N N  Using the Toilet? N N  In the past six months, have you accidently leaked urine? N N  Do you have problems with loss of bowel control? N   Managing your Medications? N N  Managing your Finances? N N  Housekeeping or managing your Housekeeping? N N    Patient Care Team: Nolene Ebbs as PCP - General (Family Medicine) Gabriel Carina, [facility] (Inactive) as Pharmacist (Pharmacist) Josph Macho, MD as Consulting Physician (Oncology) Myrtie Neither Andreas Blower, MD as Consulting Physician (Gastroenterology)  Indicate any recent  Medical Services you may have received from other than [facility] providers in the past year (date may be approximate).     Assessment:   This is a routine wellness examination for Marc Lowe.  Hearing/Vision screen No results found.   Goals Addressed             This Visit's Progress    Weight (lb) < 200 lb (90.7 kg)   218 lb (98.9 kg)    He would like to lose weight.      Depression Screen    09/09/2023   11:03 AM 08/12/2023    3:10 PM 08/15/2022    8:40 AM 07/24/2022    2:00 PM 08/13/2021    2:41 PM 03/07/2021    9:31 AM 05/29/2020    9:59 AM  PHQ 2/9 Scores  PHQ - 2 Score 0 0 0 0 0 0 0  PHQ- 9 Score  0  2  0     Fall Risk    09/09/2023   11:04 AM 09/08/2023    8:10 PM 08/12/2023    3:10 PM 08/15/2022    8:40 AM 07/24/2022    9:33 AM  Fall Risk   Falls in the past year? 0 0 0 0 1  Number falls in past yr: 0  0 0 0  Injury with Fall? 0  0 0 0  Risk for fall due to : No Fall Risks  No Fall Risks No Fall Risks History of fall(s)  Follow up Falls evaluation completed  Falls evaluation completed Falls evaluation completed;Education provided Falls evaluation completed    MEDICARE RISK AT HOME: Medicare Risk at Home Any stairs in or around the home?: Yes If so, are there any without handrails?: Yes Home free of loose throw rugs in walkways, pet beds, electrical cords, etc?: Yes Adequate lighting in your home to reduce risk of falls?: Yes Life alert?: No Use of a cane, walker or w/c?: No Grab bars in the bathroom?: Yes Shower chair or bench in shower?: No Elevated toilet seat or a handicapped toilet?: No  TIMED UP AND GO:  Was the test performed?  No    Cognitive Function:    02/18/2020   11:40 AM  MMSE - Mini Mental State Exam  Orientation to time 4  Orientation to Place 5  Registration 3  Attention/ Calculation 5  Recall 3  Language- name 2 objects 2  Language- repeat 1  Language- follow 3 step command 3  Language- read & follow direction 1  Write a sentence 1   Copy design 1  Total score 29      08/17/2021    9:06 AM  Montreal Cognitive Assessment   Visuospatial/ Executive (0/5) 5  Naming (0/3) 3  Attention: Read list of digits (0/2) 2  Attention: Read list of letters (0/1) 1  Attention: Serial 7 subtraction starting at 100 (0/3) 3  Language: Repeat phrase (0/2) 2  Language : Fluency (0/1) 1  Abstraction (0/2) 2  Delayed Recall (0/5) 3  Orientation (0/6) 6  Total 28  Adjusted Score (based on education) 28      09/09/2023   11:04 AM 08/15/2022    8:48 AM 08/13/2021    2:49 PM 05/29/2020   10:10 AM 05/25/2019    9:14 AM  6CIT Screen  What Year? 0 points 0 points 0 points 0 points 0 points  What month? 0 points 0 points 0 points 0 points 0 points  What time? 0 points 0 points 0 points 0 points 0 points  Count back from 20 0 points 0 points 0 points 0 points 0 points  Months in reverse 0 points 0 points 0 points 0 points 0 points  Repeat phrase 0 points 0 points 0 points 0 points 0 points  Total Score 0 points 0 points 0 points 0 points 0 points    Immunizations Immunization History  Administered Date(s) Administered   PFIZER(Purple Top)SARS-COV-2 Vaccination 09/30/2019, 10/21/2019   Pneumococcal Polysaccharide-23 05/06/2018   Tdap 04/14/2014   Zoster Recombinant(Shingrix) 10/22/2022, 02/10/2023   Zoster, Live 04/14/2014    TDAP status: Due, Education has been provided regarding the importance of this vaccine. Advised may receive this vaccine at local pharmacy or Health Dept. Aware to provide a copy of the vaccination record if obtained from local pharmacy or Health Dept. Verbalized acceptance and understanding.  Flu Vaccine status: Declined, Education has been provided regarding the importance of this vaccine but patient still declined. Advised may receive this vaccine at local pharmacy or Health Dept. Aware to provide a copy of the vaccination record if obtained from local pharmacy or Health Dept. Verbalized acceptance and  understanding.  Pneumococcal vaccine status: Due, Education has been provided regarding the importance of this vaccine. Advised may receive this vaccine at local pharmacy or Health Dept. Aware to provide a copy of the vaccination record if obtained from local pharmacy or Health Dept. Verbalized acceptance and understanding.  Covid-19 vaccine status: Completed vaccines  Qualifies for Shingles Vaccine? Yes   Zostavax completed No   Shingrix Completed?: Yes  Screening Tests Health Maintenance  Topic Date Due   Pneumonia Vaccine 78+ Years old (2 of 2 - PCV) 05/07/2019   COVID-19 Vaccine (3 - Pfizer risk series) 11/18/2019   INFLUENZA VACCINE  10/13/2023 (Originally 02/13/2023)   DTaP/Tdap/Td (2 - Td or Tdap) 04/14/2024   Medicare Annual Wellness (AWV)  09/08/2024   Colonoscopy  10/06/2029   Hepatitis C Screening  Completed   Zoster Vaccines- Shingrix  Completed   HPV VACCINES  Aged Out    Health Maintenance  Health Maintenance Due  Topic Date Due   Pneumonia Vaccine 48+ Years old (2 of 2 - PCV) 05/07/2019   COVID-19 Vaccine (3 - Pfizer risk series) 11/18/2019    Colorectal cancer screening: Type of screening: Colonoscopy. Completed 10/07/2019. Repeat every 10 years He has a colonoscopy coming up with Dr Myrtie Neither  Lung Cancer Screening: (Low Dose CT Chest recommended if Age 43-80 years, 20 pack-year currently smoking OR have quit w/in 15years.) does not qualify.   Lung Cancer Screening Referral: n/a  Additional Screening:  Hepatitis C Screening: does qualify; Completed 03/18/2017  Vision Screening: Recommended annual ophthalmology exams for early detection of glaucoma and other disorders of the eye. Is the patient up to date with their annual eye exam?  Yes  Who is the provider or what is the name of the office in which the patient attends annual eye exams? [email or web address] If pt is not established with a provider, would they like to be referred to a provider to establish care? No .    Dental Screening: Recommended annual dental exams for proper oral hygiene   Community Resource Referral / Chronic Care Management: CRR required this visit?  No   CCM required this visit?  No     Plan:     I have personally reviewed and noted the following in the patient's chart:   Medical and social history Use of alcohol, tobacco or illicit drugs  Current medications and supplements including opioid prescriptions. Patient is not currently taking opioid prescriptions. Functional ability and status Nutritional status Physical activity Advanced directives List of other physicians Hospitalizations, surgeries, and ER visits in previous 12 months Vitals Screenings to include cognitive, depression, and falls Referrals and appointments  In addition, I have reviewed and discussed with patient certain preventive protocols, quality metrics, and best practice recommendations. A written personalized care plan for preventive services as well as general preventive health recommendations were provided to patient.     Esmond Harps, CMA   09/09/2023   After Visit Summary: (MyChart) Due to this being a telephonic visit, the after visit summary with patients personalized plan was offered to patient via MyChart   Nurse Notes:   Marc Lowe is a 73 y.o. male patient of Jomarie Longs, PA-C who had a Medicare Annual Wellness Visit today via telephone. Marc Lowe is Retired and lives with their spouse. He has 4 children. He reports that he is socially active and does interact with friends/family regularly. He is moderately physically active and enjoys playing golf.    Recommend T-dap at local pharmacy.

## 2023-09-17 DIAGNOSIS — R972 Elevated prostate specific antigen [PSA]: Secondary | ICD-10-CM | POA: Diagnosis not present

## 2023-09-17 DIAGNOSIS — R3912 Poor urinary stream: Secondary | ICD-10-CM | POA: Diagnosis not present

## 2023-09-17 DIAGNOSIS — N401 Enlarged prostate with lower urinary tract symptoms: Secondary | ICD-10-CM | POA: Diagnosis not present

## 2023-09-17 DIAGNOSIS — E291 Testicular hypofunction: Secondary | ICD-10-CM | POA: Diagnosis not present

## 2023-09-19 ENCOUNTER — Other Ambulatory Visit: Payer: Self-pay | Admitting: Urology

## 2023-09-19 DIAGNOSIS — R972 Elevated prostate specific antigen [PSA]: Secondary | ICD-10-CM

## 2023-10-01 IMAGING — US US EXTREM LOW VENOUS*L*
1 series · 13 of 24 positions shown · non-contrast
Comparison: Ultrasound 04/11/2021.

CLINICAL DATA: Evaluate for thrombus in the left leg. He is on
blood thinner.



[Series 1: us extrem low venous*left* · 13 of 44 slices shown]
[im 1/44]
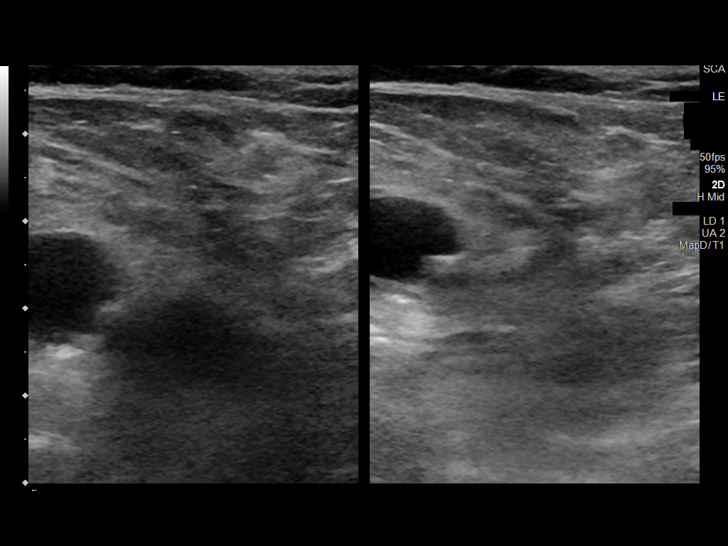
[im 4/44]
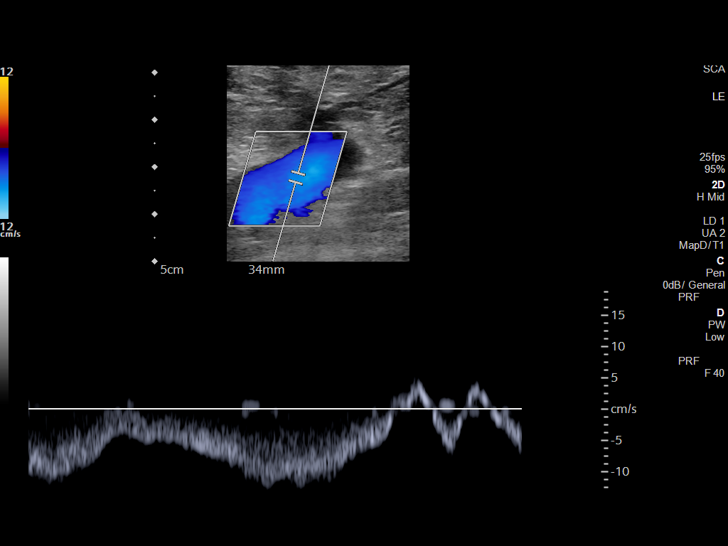
[im 8/44]
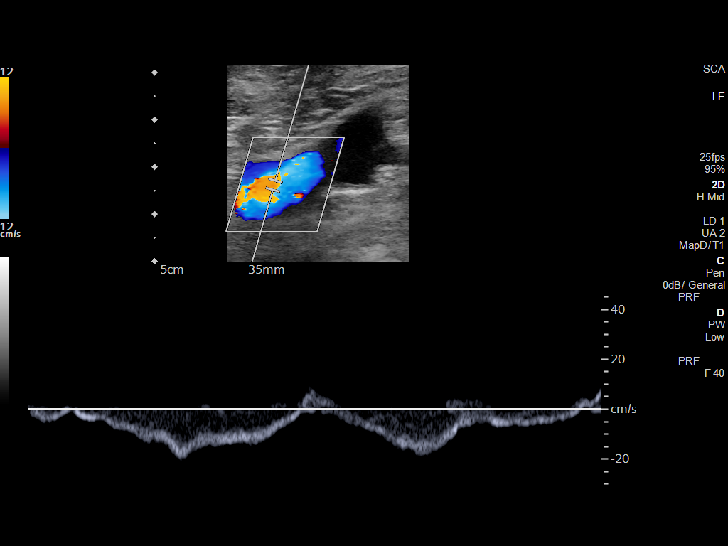
[im 12/44]
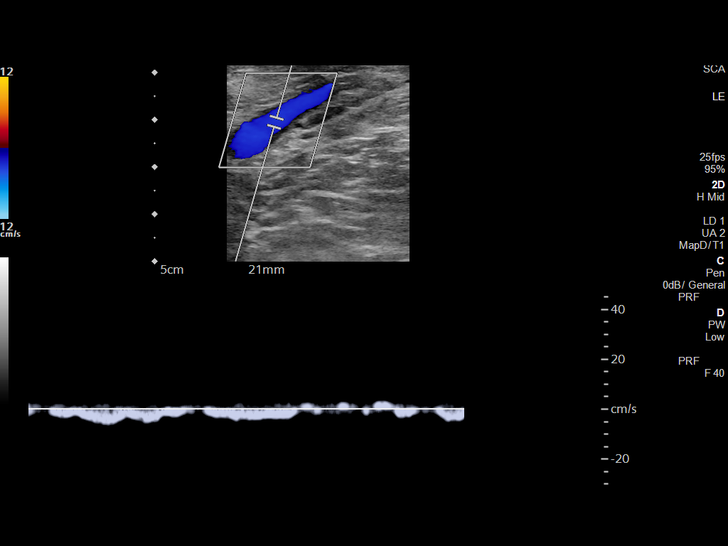
[im 15/44]
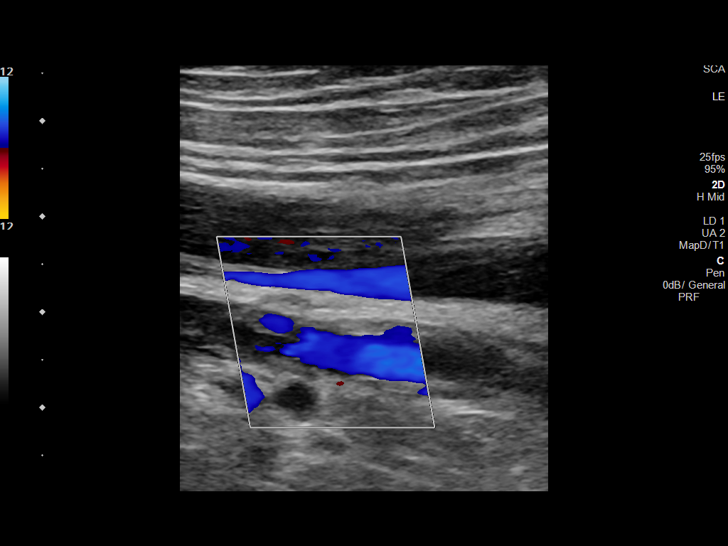
[im 19/44]
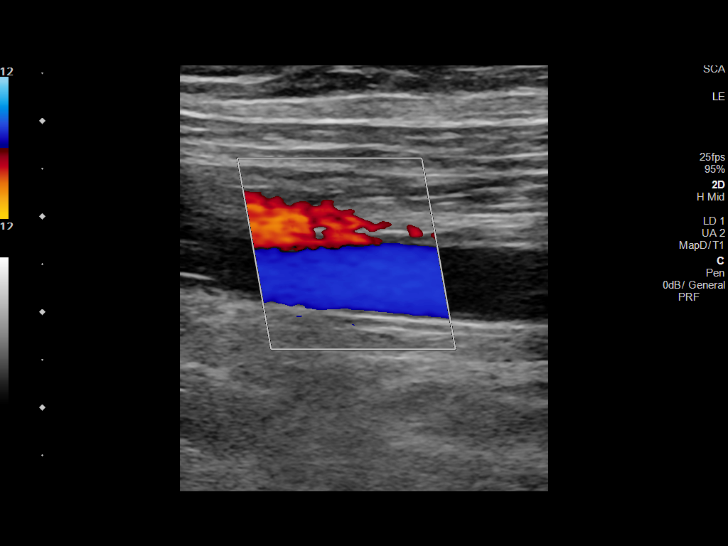
[im 23/44]
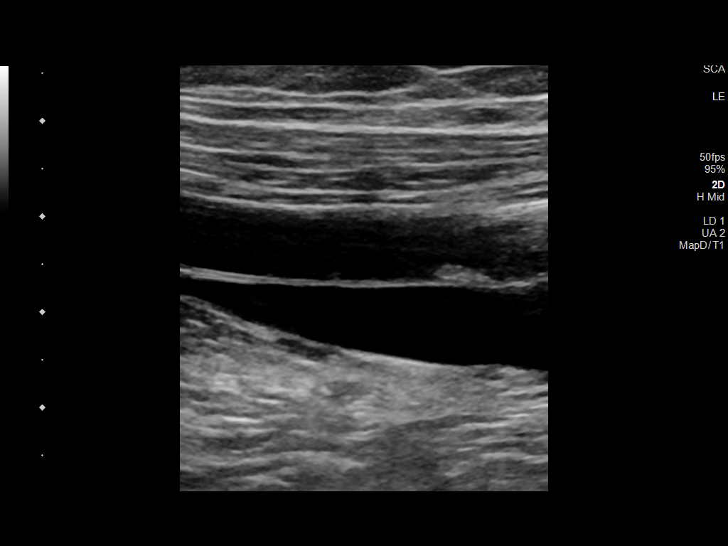
[im 25/44]
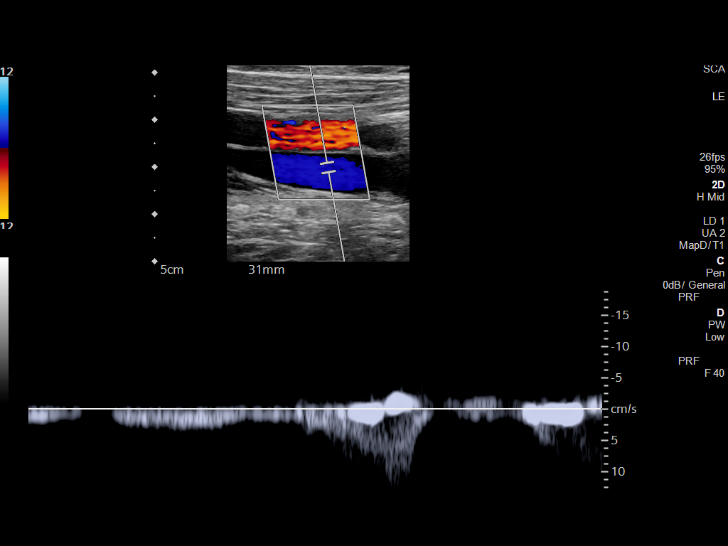
[im 29/44]
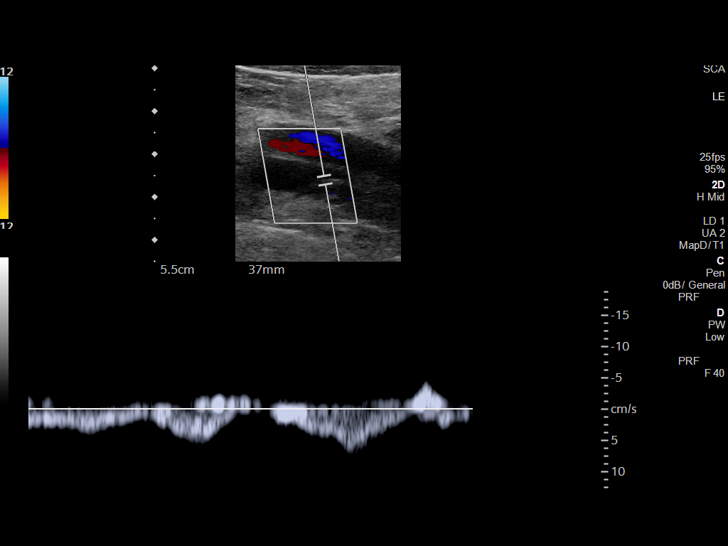
[im 32/44]
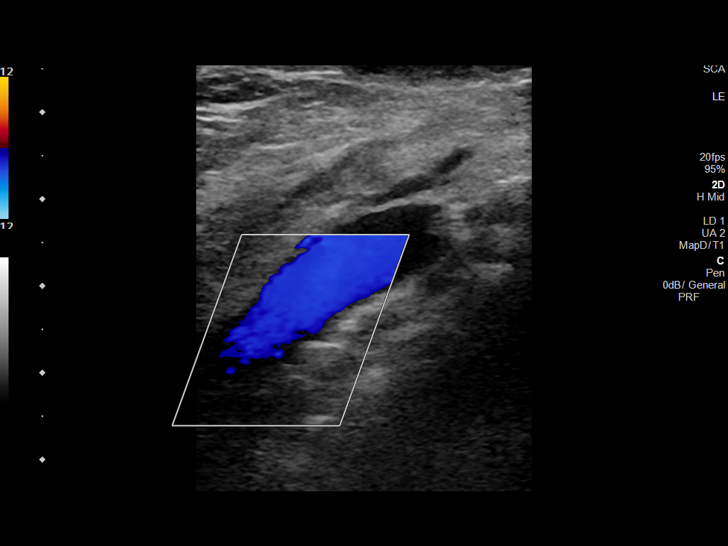
[im 36/44]
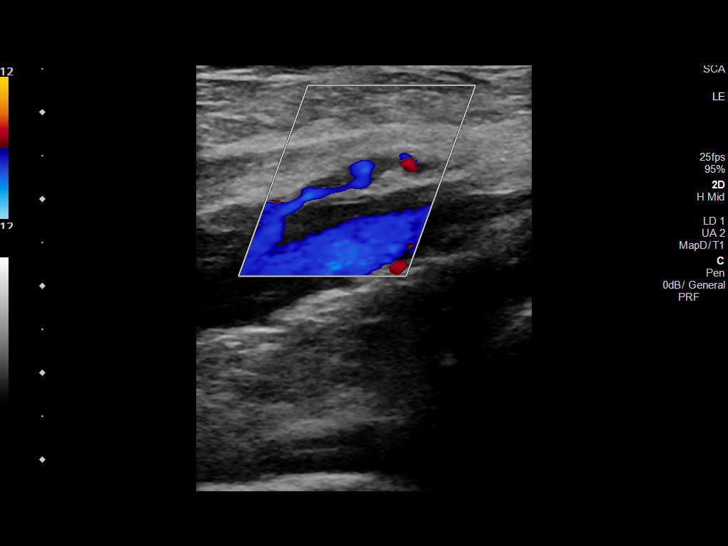
[im 40/44]
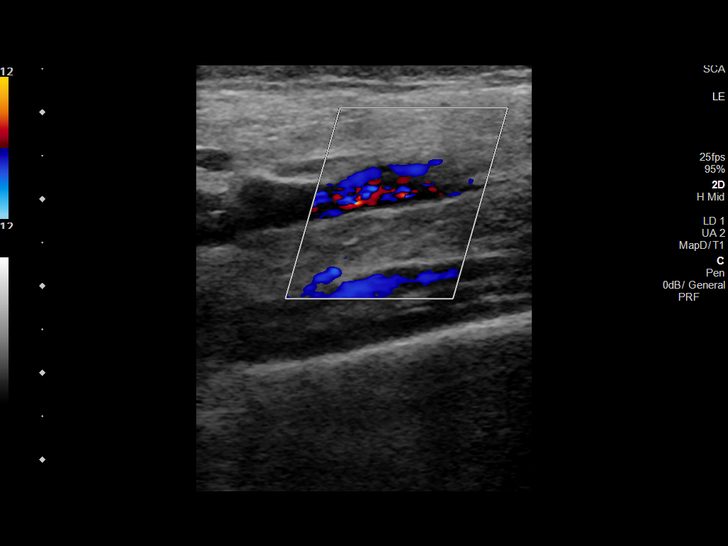
[im 44/44]
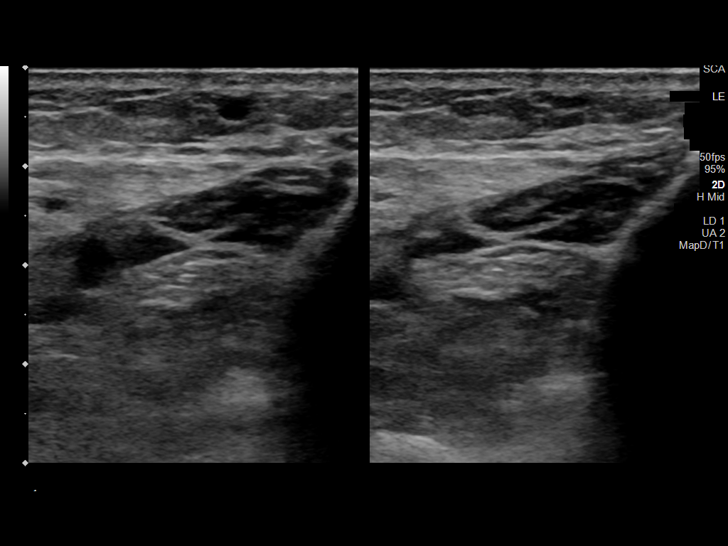

[13 of 24 positions shown; findings below may reference images not displayed]

FINDINGS: Contralateral Common Femoral Vein: Respiratory phasicity is normal
and symmetric with the symptomatic side. No evidence of thrombus.
Normal compressibility.

Common Femoral Vein: No evidence of thrombus. Normal
compressibility, respiratory phasicity and response to augmentation.

Saphenofemoral Junction: No evidence of thrombus. Normal
compressibility and flow on color Doppler imaging.

Profunda Femoral Vein: Nonocclusive thrombus with limited
compressibility.

Femoral Vein: No evidence of thrombus. Normal compressibility,
respiratory phasicity and response to augmentation.

Popliteal Vein: No evidence of thrombus. Normal compressibility,
respiratory phasicity and response to augmentation.

Calf Veins: Nonocclusive thrombus within the peroneal vein with
limited compressibility.

Other Findings: Nonocclusive thrombus within the gastrocnemius vein.
IMPRESSION: Positive for nonocclusive thrombus in the left profunda femoral,
peroneal, and gastrocnemius veins, potentially chronic.

## 2023-10-20 ENCOUNTER — Ambulatory Visit
Admission: RE | Admit: 2023-10-20 | Discharge: 2023-10-20 | Disposition: A | Discharge status: Home / Self Care | Source: Ambulatory Visit | Attending: Family Medicine | Admitting: Family Medicine

## 2023-10-20 VITALS — BP 140/95 | HR 75 | Temp 98.8°F | Resp 18 | Ht 73.25 in | Wt 230.0 lb

## 2023-10-20 DIAGNOSIS — R350 Frequency of micturition: Secondary | ICD-10-CM

## 2023-10-20 DIAGNOSIS — Z87438 Personal history of other diseases of male genital organs: Secondary | ICD-10-CM

## 2023-10-20 DIAGNOSIS — R600 Localized edema: Secondary | ICD-10-CM

## 2023-10-20 LAB — POCT URINALYSIS DIP (MANUAL ENTRY)
Bilirubin, UA: NEGATIVE
Glucose, UA: NEGATIVE mg/dL
Ketones, POC UA: NEGATIVE mg/dL
Leukocytes, UA: NEGATIVE
Nitrite, UA: NEGATIVE
Protein Ur, POC: NEGATIVE mg/dL
Spec Grav, UA: 1.015 (ref 1.010–1.025)
Urobilinogen, UA: NEGATIVE U/dL — AB
pH, UA: 7 (ref 5.0–8.0)

## 2023-10-20 MED ORDER — SULFAMETHOXAZOLE-TRIMETHOPRIM 800-160 MG PO TABS: 1.0000 | ORAL_TABLET | Freq: Two times a day (BID) | ORAL | 0 refills | Status: AC

## 2023-10-20 NOTE — Discharge Instructions (Signed)
 Take the antibiotic 2 times a day Make sure you are drinking lots of water The urine has been sent to the laboratory for culture.  You will be called if any change in antibiotic is needed  If the swelling in your ankles persists or worsens, make an appointment with your primary care doctor

## 2023-10-20 NOTE — ED Triage Notes (Signed)
 Patient c/o urinary frequency x 1 day, swollen ankles as well.  Patient did recently drive home from [place] on yesterday.  Patient denies any OTC pain meds.

## 2023-10-20 NOTE — ED Provider Notes (Signed)
 [facility]    CSN: [id number] Arrival date & time: 10/20/23  1149      History   Chief Complaint Chief Complaint  Patient presents with   Urinary Frequency    Entered by patient    HPI Marc Lowe is a 73 y.o. male.   HPI  Patient states that he and his wife just went on a long vacation in [place].  They drove back yesterday.  Yesterday on the drive back he had urinary frequency.  He states he had to stop every hour.  No dysuria.  No abdominal pain.  No fever or chills.  No nausea or vomiting.  He has known prostatic hypertrophy but does usually have this frequency.  He has concern for infection He has also noticed that he has some swelling in his ankles.  This has been going on for couple days.  He thinks he may have had too much salt on his cruise.  Past Medical History:  Diagnosis Date   BPH (benign prostatic hyperplasia)    Depression    Diverticulitis    HLD (hyperlipidemia)    Hypothyroidism (acquired)    Pulmonary emboli (HCC)     Patient Active Problem List   Diagnosis Date Noted   Chronic nasal congestion 07/24/2022   Benign prostatic hyperplasia without lower urinary tract symptoms 07/24/2022   DDD (degenerative disc disease), lumbar 02/20/2022   Bilateral hip joint arthritis 02/20/2022   Bilateral hip pain 02/19/2022   Acute bilateral low back pain without sciatica 02/19/2022   Actinic keratosis 12/18/2021   Lupus anticoagulant positive 12/18/2021   Leg DVT (deep venous thromboembolism), chronic, left (HCC) 12/18/2021   Lumbar spondylosis 10/30/2021   Hernia, ventral 10/30/2021   Localized osteoarthritis of left shoulder 08/20/2021   Osteoarthritis of right hip 08/20/2021   Chronic left shoulder pain 08/17/2021   Age spots 08/17/2021   Colonic diverticular abscess 01/04/2021   Hypothyroidism 01/04/2021   Acute deep vein thrombosis (DVT) of proximal vein of left lower extremity (HCC) 01/03/2021   History of COVID-19 01/03/2021   Acute  respiratory failure with hypoxia (HCC) 12/27/2020   Bilateral pulmonary embolism (HCC) 12/27/2020   Need for zoster vaccination 05/29/2020   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   Seborrheic keratoses 02/21/2020   Cervical radiculopathy 02/18/2020   DDD (degenerative disc disease), cervical 02/18/2020   Memory changes 02/18/2020   Anhedonia 05/13/2017   Abnormal weight gain 05/13/2017   Elevated TSH 12/18/2016   No energy 12/17/2016   Toenail fungus 12/16/2016   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   History of diverticulitis 05/08/2016   Cervical nerve root impingement 04/11/2015   Hyperlipidemia 04/15/2014   History of bowel resection 04/14/2014   IMPAIRED FASTING GLUCOSE 07/24/2009   Elevated PSA 04/10/2009   CONDYLOMA ACUMINATA 03/26/2007   HYPOGONADISM, MALE 11/04/2006   Depression, recurrent (HCC) 11/04/2006   ACNE ROSACEA 10/07/2006   LIBIDO, DECREASED 10/07/2006    Past Surgical History:  Procedure Laterality Date   COLON RESECTION SIGMOID     2005   history of colostomy  2005   s/p takedown with mesh   IR RADIOLOGIST EVAL & MGMT  01/17/2021       Home Medications    Prior to Admission medications   Medication Sig Start Date End Date Taking? Authorizing Provider  albuterol (VENTOLIN HFA) 108 (90 Base) MCG/ACT inhaler Inhale 2 puffs into the lungs every 6 (six) hours as needed. 08/12/23  Yes Breeback, Jade L, PA-C  aspirin EC  81 MG tablet Take 81 mg by mouth daily. Swallow whole.   Yes [provider]  CLOMID 50 MG tablet Take 50 mg by mouth daily. 08/09/23  Yes [provider]  fluticasone (FLONASE) 50 MCG/ACT nasal spray SPRAY 2 SPRAYS INTO EACH NOSTRIL EVERY DAY 10/21/22  Yes Breeback, Jade L, PA-C  levothyroxine (EUTHYROX) 25 MCG tablet Take 1 tablet (25 mcg total) by mouth daily before breakfast. 08/13/23  Yes Breeback, Jade L, PA-C  rivaroxaban (XARELTO) 20 MG TABS tablet Take 1 tablet (20 mg total) by mouth daily with supper. 08/13/23   Yes Breeback, Jade L, PA-C  Sildenafil Citrate (VIAGRA PO) Take by mouth.   Yes [provider]  sulfamethoxazole-trimethoprim (BACTRIM DS) 800-160 MG tablet Take 1 tablet by mouth 2 (two) times daily for 7 days. 10/20/23 10/27/23 Yes Eustace Moore, MD  tamsulosin (FLOMAX) 0.4 MG CAPS capsule Take 1 capsule (0.4 mg total) by mouth at bedtime. 08/13/23  Yes Breeback, Jade L, PA-C  Vilazodone HCl 20 MG TABS Take 1 tablet (20 mg total) by mouth daily. 08/13/23  Yes Jomarie Longs, PA-C    Family History Family History  Problem Relation Age of Onset   Cancer Mother        type unknown   Atrial fibrillation Father    Prostate cancer Father    Leukemia Sister 21       CML   Drug abuse Sister    Breast cancer Daughter     Social History Social History   Tobacco Use   Smoking status: Never   Smokeless tobacco: Never  Vaping Use   Vaping status: Never Used  Substance Use Topics   Alcohol use: Yes    Alcohol/week: 3.0 standard drinks of alcohol    Types: 3 Glasses of wine per week    Comment: 5 oz a day per week-wine   Drug use: No     Allergies   Dilaudid [hydromorphone]   Review of Systems Review of Systems  See HPI Physical Exam Triage Vital Signs ED Triage Vitals  Encounter Vitals Group     BP 10/20/23 1207 (!) 140/95     Systolic BP Percentile --      Diastolic BP Percentile --      Pulse Rate 10/20/23 1207 75     Resp 10/20/23 1207 18     Temp 10/20/23 1207 98.8 F (37.1 C)     Temp Source 10/20/23 1207 Oral     SpO2 10/20/23 1207 95 %     Weight 10/20/23 1208 230 lb (104.3 kg)     Height 10/20/23 1208 6' 1.25" (1.861 m)     Head Circumference --      Peak Flow --      Pain Score 10/20/23 1208 0     Pain Loc --      Pain Education --      Exclude from Growth Chart --    No data found.  Updated Vital Signs BP (!) 140/95 (BP Location: Right Arm)   Pulse 75   Temp 98.8 F (37.1 C) (Oral)   Resp 18   Ht 6' 1.25" (1.861 m)   Wt 104.3 kg    SpO2 95%   BMI 30.14 kg/m      Physical Exam Constitutional:      General: He is not in acute distress.    Appearance: He is well-developed and normal weight. He is not ill-appearing.  HENT:     Head:  Normocephalic and atraumatic.  Eyes:     Conjunctiva/sclera: Conjunctivae normal.     Pupils: Pupils are equal, round, and reactive to light.  Cardiovascular:     Rate and Rhythm: Normal rate and regular rhythm.     Heart sounds: Normal heart sounds.  Pulmonary:     Effort: Pulmonary effort is normal. No respiratory distress.     Breath sounds: Normal breath sounds. No rales.  Chest:     Chest wall: No tenderness.  Abdominal:     General: There is no distension.     Palpations: Abdomen is soft.  Musculoskeletal:        General: Normal range of motion.     Cervical back: Normal range of motion.     Right lower leg: Edema present.     Left lower leg: Edema present.     Comments: Trace to 1+ edema bilaterally.  Skin:    General: Skin is warm and dry.  Neurological:     Mental Status: He is alert.      UC Treatments / Results  Labs (all labs ordered are listed, but only abnormal results are displayed) Labs Reviewed  POCT URINALYSIS DIP (MANUAL ENTRY) - Abnormal; Notable for the following components:      Result Value   Blood, UA trace-intact (*)    Urobilinogen, UA negative (*)    All other components within normal limits  URINE CULTURE    EKG   Radiology No results found.  Procedures Procedures (including critical care time)  Medications Ordered in UC Medications - No data to display  Initial Impression / Assessment and Plan / UC Course  I have reviewed the triage vital signs and the nursing notes.  Pertinent labs & imaging results that were available during my care of the patient were reviewed by me and considered in my medical decision making (see chart for details).     The urine is minimally abnormal.  I will treat with antibiotics and sent for  culture.  I cannot think any other reason that he would have this urinary frequency.  I do not think is related to the pedal edema. Exam is normal.  I told him the pedal edema should resolve, otherwise he needs to see his PCP Final Clinical Impressions(s) / UC Diagnoses   Final diagnoses:  Urinary frequency  History of BPH  Pedal edema     Discharge Instructions      Take the antibiotic 2 times a day Make sure you are drinking lots of water The urine has been sent to the laboratory for culture.  You will be called if any change in antibiotic is needed  If the swelling in your ankles persists or worsens, make an appointment with your primary care doctor   ED Prescriptions     Medication Sig Dispense Auth. Provider   sulfamethoxazole-trimethoprim (BACTRIM DS) 800-160 MG tablet Take 1 tablet by mouth 2 (two) times daily for 7 days. 14 tablet Eustace Moore, MD      PDMP not reviewed this encounter.   Eustace Moore, MD 10/20/23 [phone]

## 2023-10-21 ENCOUNTER — Encounter: Payer: Self-pay | Admitting: Urology

## 2023-10-21 LAB — URINE CULTURE
Culture: NO GROWTH
Special Requests: NORMAL

## 2023-10-28 ENCOUNTER — Encounter: Payer: Self-pay | Admitting: Gastroenterology

## 2023-10-28 ENCOUNTER — Ambulatory Visit (AMBULATORY_SURGERY_CENTER): Payer: No Typology Code available for payment source | Admitting: Gastroenterology

## 2023-10-28 VITALS — BP 111/63 | HR 62 | Temp 98.0°F | Resp 16 | Ht 72.0 in | Wt 224.0 lb

## 2023-10-28 DIAGNOSIS — Z9889 Other specified postprocedural states: Secondary | ICD-10-CM

## 2023-10-28 DIAGNOSIS — Z1211 Encounter for screening for malignant neoplasm of colon: Secondary | ICD-10-CM

## 2023-10-28 DIAGNOSIS — K5730 Diverticulosis of large intestine without perforation or abscess without bleeding: Secondary | ICD-10-CM

## 2023-10-28 DIAGNOSIS — Z980 Intestinal bypass and anastomosis status: Secondary | ICD-10-CM | POA: Diagnosis not present

## 2023-10-28 DIAGNOSIS — E039 Hypothyroidism, unspecified: Secondary | ICD-10-CM | POA: Diagnosis not present

## 2023-10-28 DIAGNOSIS — Z860100 Personal history of colon polyps, unspecified: Secondary | ICD-10-CM | POA: Diagnosis not present

## 2023-10-28 DIAGNOSIS — F32A Depression, unspecified: Secondary | ICD-10-CM | POA: Diagnosis not present

## 2023-10-28 DIAGNOSIS — K648 Other hemorrhoids: Secondary | ICD-10-CM

## 2023-10-28 MED ORDER — SODIUM CHLORIDE 0.9 % IV SOLN: 500.0000 mL | Freq: Once | INTRAVENOUS | Status: DC

## 2023-10-28 NOTE — Op Note (Signed)
 [facility] Procedure Date: 10/28/2023 10:09 AM MRN: [id number] Endoscopist: Sherilyn Cooter L. Myrtie Neither , MD, [id number] Age: 73 Referring MD:  Date of Birth: 11-06-1950 Gender: Male Account #: [id number] Procedure:                Colonoscopy Indications:              Surveillance: Personal history of adenomatous                            polyps on last colonoscopy > 3 years ago                           Clinical details in 08/20/2023 office note.                           Distant rectosigmoid resection for diverticulitis.                           Subsequent robotic TAMS for resection of large                            sessile rectal polyp in 2021. Patient had                            surveillance sigmoidoscopy by that surgeon later                            the same year, report not available. Medicines:                Monitored Anesthesia Care Procedure:                Pre-Anesthesia Assessment:                           - Prior to the procedure, a History and Physical                            was performed, and patient medications and                            allergies were reviewed. The patient's tolerance of                            previous anesthesia was also reviewed. The risks                            and benefits of the procedure and the sedation                            options and risks were discussed with the patient.                            All questions were answered, and informed consent  was obtained. Prior Anticoagulants: The patient has                            taken Xarelto (rivaroxaban), last dose was 2 days                            prior to procedure. ASA Grade Assessment: II - A                            patient with mild systemic disease. After reviewing                            the risks and benefits, the patient was deemed in                            satisfactory condition to  undergo the procedure.                           After obtaining informed consent, the colonoscope                            was passed under direct vision. Throughout the                            procedure, the patient's blood pressure, pulse, and                            oxygen saturations were monitored continuously. The                            Olympus Scope [id number] was introduced through the                            anus and advanced to the the terminal ileum, with                            identification of the appendiceal orifice and IC                            valve. The colonoscopy was performed without                            difficulty. The patient tolerated the procedure                            well. The quality of the bowel preparation was                            generally very good (with some scattered fibrous                            debris and small stool balls in the proximal colon  that cannot be completely cleared-see photo). The                            terminal ileum, ileocecal valve, appendiceal                            orifice, and rectum were photographed. Scope In: 10:35:35 AM Scope Out: 10:48:50 AM Scope Withdrawal Time: 0 hours 10 minutes 49 seconds  Total Procedure Duration: 0 hours 13 minutes 15 seconds  Findings:                 The perianal and digital rectal examinations were                            normal.                           The terminal ileum appeared normal.                           Repeat examination of right colon under NBI                            performed.                           There was evidence of a prior end-to-end                            colo-rectal anastomosis in the rectum. This was                            patent and was characterized by healthy appearing                            mucosa.                           A large post polypectomy scar was found in the mid                             rectum. The scar tissue was healthy in appearance.                            There was no evidence of the previous polyp. Area                            extensively evaluated from multiple angles under WL                            and NBI.                           Diverticula were found in the left colon.  Internal hemorrhoids were found.                           The exam was otherwise without abnormality on                            direct and retroflexion views. Complications:            No immediate complications. Estimated Blood Loss:     Estimated blood loss: none. Impression:               - The examined portion of the ileum was normal.                           - Patent end-to-end colo-rectal anastomosis,                            characterized by healthy appearing mucosa.                           - Post-polypectomy scar in the mid rectum.                           - Diverticulosis in the left colon.                           - Internal hemorrhoids.                           - The examination was otherwise normal on direct                            and retroflexion views.                           - No specimens collected. Recommendation:           - Patient has a contact number available for                            emergencies. The signs and symptoms of potential                            delayed complications were discussed with the                            patient. Return to normal activities tomorrow.                            Written discharge instructions were provided to the                            patient.                           - Resume previous diet.                           -  Continue present medications.                           - Resume Xarelto (rivaroxaban) at prior dose today.                           - Repeat colonoscopy in 3 years for surveillance.                            (Consume  more water with prep for next exam for                            better clearance of proximal right colon) Zera Markwardt L. Dominic Friendly, MD 10/28/2023 11:04:30 AM This report has been signed electronically.

## 2023-10-28 NOTE — Progress Notes (Signed)
 Pt's states no medical or surgical changes since previsit or office visit.

## 2023-10-28 NOTE — Progress Notes (Signed)
 History and Physical:  This patient presents for endoscopic testing for: Encounter Diagnosis  Name Primary?   Hx of colonic polyps Yes    Clinical details in 08/21/23 office note Hx large rectal polyp with robotic TAMS procedure 20201 and sigmoidoscopy by surgeon later that year.  Recurrent diverticulitis with prior resection.   Off OAC last 2 days Patient is otherwise without complaints or active issues today.   Past Medical History: Past Medical History:  Diagnosis Date   BPH (benign prostatic hyperplasia)    Depression    Diverticulitis    HLD (hyperlipidemia)    Hypothyroidism (acquired)    Pulmonary emboli (HCC)      Past Surgical History: Past Surgical History:  Procedure Laterality Date   COLON RESECTION SIGMOID     2005   history of colostomy  2005   s/p takedown with mesh   IR RADIOLOGIST EVAL & MGMT  01/17/2021    Allergies: Allergies  Allergen Reactions   Dilaudid [Hydromorphone] Nausea And Vomiting    Outpatient Meds: Current Outpatient Medications  Medication Sig Dispense Refill   aspirin EC 81 MG tablet Take 81 mg by mouth daily. Swallow whole.     CLOMID 50 MG tablet Take 50 mg by mouth daily.     fluticasone (FLONASE) 50 MCG/ACT nasal spray SPRAY 2 SPRAYS INTO EACH NOSTRIL EVERY DAY 48 mL 0   levothyroxine (EUTHYROX) 25 MCG tablet Take 1 tablet (25 mcg total) by mouth daily before breakfast. 90 tablet 3   tamsulosin (FLOMAX) 0.4 MG CAPS capsule Take 1 capsule (0.4 mg total) by mouth at bedtime. 90 capsule 3   Vilazodone HCl 20 MG TABS Take 1 tablet (20 mg total) by mouth daily. 90 tablet 3   albuterol (VENTOLIN HFA) 108 (90 Base) MCG/ACT inhaler Inhale 2 puffs into the lungs every 6 (six) hours as needed. 8 g 0   rivaroxaban (XARELTO) 20 MG TABS tablet Take 1 tablet (20 mg total) by mouth daily with supper. 90 tablet 3   Sildenafil Citrate (VIAGRA PO) Take by mouth.     Current Facility-Administered Medications  Medication Dose Route Frequency  Provider Last Rate Last Admin   0.9 %  sodium chloride infusion  500 mL Intravenous Once Sherrilyn Rist, MD          ___________________________________________________________________ Objective   Exam:  BP (!) 129/92   Pulse 66   Temp 98 F (36.7 C)   Resp 16   Ht 6' (1.829 m)   Wt 224 lb (101.6 kg)   SpO2 95%   BMI 30.38 kg/m   CV: regular , S1/S2 Resp: clear to auscultation bilaterally, normal RR and effort noted GI: soft, no tenderness, with active bowel sounds.   Assessment: Encounter Diagnosis  Name Primary?   Hx of colonic polyps Yes     Plan: Colonoscopy   The benefits and risks of the planned procedure(s) were described in detail with the patient or (when appropriate) their health care proxy.  Risks were outlined as including, but not limited to, bleeding, infection, perforation, adverse medication reaction leading to cardiac or pulmonary decompensation, pancreatitis (if ERCP).  The limitation of incomplete mucosal visualization was also discussed.  No guarantees or warranties were given.  The patient is appropriate for an endoscopic procedure in the ambulatory setting.   - Amada Jupiter, MD

## 2023-10-28 NOTE — Patient Instructions (Signed)
 Please read handouts provided. Continue present medications. Resume Xarelto ( rivaroxaban ) at prior dose today. Repeat colonoscopy in 3 years for screening.  YOU HAD AN ENDOSCOPIC PROCEDURE TODAY AT THE [facility]:   Refer to the procedure report that was given to you for any specific questions about what was found during the examination.  If the procedure report does not answer your questions, please call your gastroenterologist to clarify.  If you requested that your care partner not be given the details of your procedure findings, then the procedure report has been included in a sealed envelope for you to review at your convenience later.  YOU SHOULD EXPECT: Some feelings of bloating in the abdomen. Passage of more gas than usual.  Walking can help get rid of the air that was put into your GI tract during the procedure and reduce the bloating. If you had a lower endoscopy (such as a colonoscopy or flexible sigmoidoscopy) you may notice spotting of blood in your stool or on the toilet paper. If you underwent a bowel prep for your procedure, you may not have a normal bowel movement for a few days.  Please Note:  You might notice some irritation and congestion in your nose or some drainage.  This is from the oxygen used during your procedure.  There is no need for concern and it should clear up in a day or so.  SYMPTOMS TO REPORT IMMEDIATELY:  Following lower endoscopy (colonoscopy or flexible sigmoidoscopy):  Excessive amounts of blood in the stool  Significant tenderness or worsening of abdominal pains  Swelling of the abdomen that is new, acute  Fever of 100F or higher.  For urgent or emergent issues, a gastroenterologist can be reached at any hour by calling [phone]. Do not use MyChart messaging for urgent concerns.    DIET:  We do recommend a small meal at first, but then you may proceed to your regular diet.  Drink plenty of fluids but you should avoid alcoholic  beverages for 24 hours.  ACTIVITY:  You should plan to take it easy for the rest of today and you should NOT DRIVE or use heavy machinery until tomorrow (because of the sedation medicines used during the test).    FOLLOW UP: Our staff will call the number listed on your records the next business day following your procedure.  We will call around 7:15- 8:00 am to check on you and address any questions or concerns that you may have regarding the information given to you following your procedure. If we do not reach you, we will leave a message.     If any biopsies were taken you will be contacted by phone or by letter within the next 1-3 weeks.  Please call us  at [phone] if you have not heard about the biopsies in 3 weeks.    SIGNATURES/CONFIDENTIALITY: You and/or your care partner have signed paperwork which will be entered into your electronic medical record.  These signatures attest to the fact that that the information above on your After Visit Summary has been reviewed and is understood.  Full responsibility of the confidentiality of this discharge information lies with you and/or your care-partner.

## 2023-10-28 NOTE — Progress Notes (Signed)
 Vss nad trans to pacu

## 2023-10-29 ENCOUNTER — Telehealth: Payer: Self-pay

## 2023-10-29 DIAGNOSIS — D225 Melanocytic nevi of trunk: Secondary | ICD-10-CM | POA: Diagnosis not present

## 2023-10-29 DIAGNOSIS — L821 Other seborrheic keratosis: Secondary | ICD-10-CM | POA: Diagnosis not present

## 2023-10-29 DIAGNOSIS — L578 Other skin changes due to chronic exposure to nonionizing radiation: Secondary | ICD-10-CM | POA: Diagnosis not present

## 2023-10-29 DIAGNOSIS — L820 Inflamed seborrheic keratosis: Secondary | ICD-10-CM | POA: Diagnosis not present

## 2023-10-29 NOTE — Telephone Encounter (Signed)
 No answer, left message to call if having any issues or concerns, B.Vale Mousseau RN

## 2023-11-03 ENCOUNTER — Ambulatory Visit
Admission: RE | Admit: 2023-11-03 | Discharge: 2023-11-03 | Disposition: A | Discharge status: Home / Self Care | Source: Ambulatory Visit | Attending: Urology | Admitting: Urology

## 2023-11-03 DIAGNOSIS — R972 Elevated prostate specific antigen [PSA]: Secondary | ICD-10-CM

## 2023-11-03 MED ORDER — GADOPICLENOL 0.5 MMOL/ML IV SOLN
10.0000 mL | Freq: Once | INTRAVENOUS | Status: AC | PRN
  Administered 2023-11-03: 10 mL via INTRAVENOUS

## 2023-12-18 DIAGNOSIS — R972 Elevated prostate specific antigen [PSA]: Secondary | ICD-10-CM | POA: Diagnosis not present

## 2023-12-24 DIAGNOSIS — N5201 Erectile dysfunction due to arterial insufficiency: Secondary | ICD-10-CM | POA: Diagnosis not present

## 2023-12-24 DIAGNOSIS — R3912 Poor urinary stream: Secondary | ICD-10-CM | POA: Diagnosis not present

## 2023-12-24 DIAGNOSIS — R972 Elevated prostate specific antigen [PSA]: Secondary | ICD-10-CM | POA: Diagnosis not present

## 2023-12-24 DIAGNOSIS — N401 Enlarged prostate with lower urinary tract symptoms: Secondary | ICD-10-CM | POA: Diagnosis not present

## 2024-03-13 DIAGNOSIS — E663 Overweight: Secondary | ICD-10-CM | POA: Diagnosis not present

## 2024-03-13 DIAGNOSIS — Z6828 Body mass index (BMI) 28.0-28.9, adult: Secondary | ICD-10-CM | POA: Diagnosis not present

## 2024-03-13 DIAGNOSIS — N401 Enlarged prostate with lower urinary tract symptoms: Secondary | ICD-10-CM | POA: Diagnosis not present

## 2024-03-13 DIAGNOSIS — Z860100 Personal history of colon polyps, unspecified: Secondary | ICD-10-CM | POA: Diagnosis not present

## 2024-03-13 DIAGNOSIS — Z008 Encounter for other general examination: Secondary | ICD-10-CM | POA: Diagnosis not present

## 2024-03-16 ENCOUNTER — Encounter: Payer: Self-pay | Admitting: Sports Medicine

## 2024-03-29 DIAGNOSIS — H524 Presbyopia: Secondary | ICD-10-CM | POA: Diagnosis not present

## 2024-05-24 ENCOUNTER — Other Ambulatory Visit: Payer: Self-pay

## 2024-05-25 ENCOUNTER — Encounter: Payer: Self-pay | Admitting: Hematology & Oncology

## 2024-05-25 ENCOUNTER — Inpatient Hospital Stay: Attending: Hematology & Oncology

## 2024-05-25 ENCOUNTER — Inpatient Hospital Stay (HOSPITAL_BASED_OUTPATIENT_CLINIC_OR_DEPARTMENT_OTHER): Admitting: Hematology & Oncology

## 2024-05-25 VITALS — BP 154/96 | HR 62 | Temp 98.3°F | Resp 20 | Ht 74.0 in | Wt 225.8 lb

## 2024-05-25 DIAGNOSIS — I2699 Other pulmonary embolism without acute cor pulmonale: Secondary | ICD-10-CM

## 2024-05-25 DIAGNOSIS — Z7982 Long term (current) use of aspirin: Secondary | ICD-10-CM | POA: Diagnosis not present

## 2024-05-25 DIAGNOSIS — Z7901 Long term (current) use of anticoagulants: Secondary | ICD-10-CM | POA: Diagnosis not present

## 2024-05-25 DIAGNOSIS — R760 Raised antibody titer: Secondary | ICD-10-CM

## 2024-05-25 DIAGNOSIS — I82502 Chronic embolism and thrombosis of unspecified deep veins of left lower extremity: Secondary | ICD-10-CM

## 2024-05-25 DIAGNOSIS — D6862 Lupus anticoagulant syndrome: Secondary | ICD-10-CM | POA: Insufficient documentation

## 2024-05-25 LAB — CMP (CANCER CENTER ONLY)
ALT: 22 U/L (ref 0–44)
AST: 25 U/L (ref 15–41)
Albumin: 4.4 g/dL (ref 3.5–5.0)
Alkaline Phosphatase: 68 U/L (ref 38–126)
Anion gap: 11 (ref 5–15)
BUN: 11 mg/dL (ref 8–23)
CO2: 26 mmol/L (ref 22–32)
Calcium: 9.4 mg/dL (ref 8.9–10.3)
Chloride: 104 mmol/L (ref 98–111)
Creatinine: 1.04 mg/dL (ref 0.61–1.24)
GFR, Estimated: >60 mL/min (ref >60)
Glucose, Bld: 109 mg/dL — ABNORMAL HIGH (ref 70–99)
Potassium: 4.5 mmol/L (ref 3.5–5.1)
Sodium: 140 mmol/L (ref 135–145)
Total Bilirubin: 0.5 mg/dL (ref 0.0–1.2)
Total Protein: 6.8 g/dL (ref 6.5–8.1)

## 2024-05-25 LAB — CBC WITH DIFFERENTIAL (CANCER CENTER ONLY)
Abs Immature Granulocytes: 0 K/uL (ref 0.00–0.07)
Basophils Absolute: 0 K/uL (ref 0.0–0.1)
Basophils Relative: 1 %
Eosinophils Absolute: 0.1 K/uL (ref 0.0–0.5)
Eosinophils Relative: 2 %
HCT: 47.3 % (ref 39.0–52.0)
Hemoglobin: 16.4 g/dL (ref 13.0–17.0)
Immature Granulocytes: 0 %
Lymphocytes Relative: 33 %
Lymphs Abs: 1.4 K/uL (ref 0.7–4.0)
MCH: 29.8 pg (ref 26.0–34.0)
MCHC: 34.7 g/dL (ref 30.0–36.0)
MCV: 86 fL (ref 80.0–100.0)
Monocytes Absolute: 0.5 K/uL (ref 0.1–1.0)
Monocytes Relative: 11 %
Neutro Abs: 2.3 K/uL (ref 1.7–7.7)
Neutrophils Relative %: 53 %
Platelet Count: 199 K/uL (ref 150–400)
RBC: 5.5 MIL/uL (ref 4.22–5.81)
RDW: 12.6 % (ref 11.5–15.5)
WBC Count: 4.2 K/uL (ref 4.0–10.5)
nRBC: 0 % (ref 0.0–0.2)

## 2024-05-25 LAB — D-DIMER, QUANTITATIVE: D-Dimer, Quant: <0.27 ug{FEU}/mL (ref 0.00–0.50)

## 2024-05-25 MED ORDER — RIVAROXABAN 10 MG PO TABS: 10.0000 mg | ORAL_TABLET | Freq: Every day | ORAL | 12 refills | Status: DC

## 2024-05-25 NOTE — Progress Notes (Signed)
 Hematology and Oncology Follow Up Visit  Marc Lowe [id number] Dec 29, 1950 73 y.o. 05/25/2024   Principle Diagnosis:  Bilateral pulmonary emboli/left lower extremity DVT --(+) lupus anticoagulant  Current Therapy:   Xarelto  20 mg p.o. daily-started 12/2020 -changed to 10 mg p.o. daily on 05/25/2024 EC ASA 81 mg po q day -- start on 02/13/2022     Interim History:  Mr. Marc Lowe is back for follow-up.  We last saw him a year and a half ago.  He has been doing okay.  He has really had no complaints.  He does have some easy bruising.  He is wondering if he might be able to decrease or get off the Xarelto .  I told him that with the positive lupus anticoagulant, which was positive last time that we saw him, he probably needs to be on some anticoagulation.  I will decrease the Xarelto  to 10 mg a day now.  Otherwise, he has been very active.  He walks quite a bit.  He has wife will be going on a cruise to [place] .  I know that they will have a good time.  He has had no problems with nausea or vomiting.  He has had no problems with cough or shortness of breath.  He has had no chest wall pain.  He has had no change in bowel or bladder habits.  He has had no leg pain or swelling.  Overall, I will say that his performance status is by ECOG 1.    Medications:  Current Outpatient Medications:    albuterol  (VENTOLIN  HFA) 108 (90 Base) MCG/ACT inhaler, Inhale 2 puffs into the lungs every 6 (six) hours as needed., Disp: 8 g, Rfl: 0   aspirin  EC 81 MG tablet, Take 81 mg by mouth daily. Swallow whole., Disp: , Rfl:    CLOMID 50 MG tablet, Take 50 mg by mouth daily., Disp: , Rfl:    fluticasone  (FLONASE ) 50 MCG/ACT nasal spray, SPRAY 2 SPRAYS INTO EACH NOSTRIL EVERY DAY, Disp: 48 mL, Rfl: 0   levothyroxine  (EUTHYROX ) 25 MCG tablet, Take 1 tablet (25 mcg total) by mouth daily before breakfast., Disp: 90 tablet, Rfl: 3   rivaroxaban  (XARELTO ) 20 MG TABS tablet, Take 1 tablet (20 mg total) by mouth daily  with supper., Disp: 90 tablet, Rfl: 3   Sildenafil Citrate (VIAGRA PO), Take by mouth., Disp: , Rfl:    tamsulosin  (FLOMAX ) 0.4 MG CAPS capsule, Take 1 capsule (0.4 mg total) by mouth at bedtime., Disp: 90 capsule, Rfl: 3   Vilazodone  HCl 20 MG TABS, Take 1 tablet (20 mg total) by mouth daily., Disp: 90 tablet, Rfl: 3  Allergies:  Allergies  Allergen Reactions   Dilaudid [Hydromorphone] Nausea And Vomiting     Past Medical History, Surgical history, Social history, and Family History were reviewed and updated.  Review of Systems: Review of Systems  Constitutional: Negative.   HENT:  Negative.    Eyes: Negative.   Respiratory: Negative.    Cardiovascular: Negative.   Gastrointestinal: Negative.   Endocrine: Negative.   Genitourinary: Negative.    Musculoskeletal: Negative.   Skin: Negative.   Neurological: Negative.   Hematological: Negative.   Psychiatric/Behavioral: Negative.      Physical Exam:  height is 6' 2 (1.88 m) and weight is 225 lb 12.8 oz (102.4 kg). His oral temperature is 98.3 F (36.8 C). His blood pressure is 154/96 (abnormal) and his pulse is 62. His respiration is 20 and oxygen saturation is 95%.  Wt Readings from Last 3 Encounters:  05/25/24 225 lb 12.8 oz (102.4 kg)  10/28/23 224 lb (101.6 kg)  10/20/23 230 lb (104.3 kg)    Physical Exam Vitals reviewed.  HENT:     Head: Normocephalic and atraumatic.  Eyes:     Pupils: Pupils are equal, round, and reactive to light.  Cardiovascular:     Rate and Rhythm: Normal rate and regular rhythm.     Heart sounds: Normal heart sounds.  Pulmonary:     Effort: Pulmonary effort is normal.     Breath sounds: Normal breath sounds.  Abdominal:     General: Bowel sounds are normal.     Palpations: Abdomen is soft.  Musculoskeletal:        General: No tenderness or deformity. Normal range of motion.     Cervical back: Normal range of motion.  Lymphadenopathy:     Cervical: No cervical adenopathy.  Skin:     General: Skin is warm and dry.     Findings: No erythema or rash.  Neurological:     Mental Status: He is alert and oriented to person, place, and time.  Psychiatric:        Behavior: Behavior normal.        Thought Content: Thought content normal.        Judgment: Judgment normal.      Lab Results  Component Value Date   WBC 4.2 05/25/2024   HGB 16.4 05/25/2024   HCT 47.3 05/25/2024   MCV 86.0 05/25/2024   PLT 199 05/25/2024     Chemistry      Component Value Date/Time   NA 141 08/12/2023 1019   K 5.0 08/12/2023 1019   CL 102 08/12/2023 1019   CO2 18 (L) 08/12/2023 1019   BUN 14 08/12/2023 1019   CREATININE 1.09 08/12/2023 1019   CREATININE 1.08 01/24/2023 1509   CREATININE 1.00 07/24/2022 1021      Component Value Date/Time   CALCIUM  9.3 08/12/2023 1019   ALKPHOS 73 08/12/2023 1019   AST 28 08/12/2023 1019   AST 18 01/24/2023 1509   ALT 33 08/12/2023 1019   ALT 16 01/24/2023 1509   BILITOT 0.5 08/12/2023 1019   BILITOT 0.5 01/24/2023 1509      Impression and Plan: Mr. Marc Lowe is a very nice 73 year old white male.  He is originally from [place] .  He has had a positive lupus anticoagulant.   Again, I will decrease the Xarelto  down to 10 mg a day.  I will plan to get him back in 1 year.  If there are any problems between now in 1 year, he will let me know.   Maude JONELLE Crease, MD 11/11/202510:16 AM

## 2024-07-22 ENCOUNTER — Telehealth: Payer: Self-pay

## 2024-07-22 ENCOUNTER — Other Ambulatory Visit: Payer: Self-pay | Admitting: Hematology & Oncology

## 2024-07-22 DIAGNOSIS — I824Y2 Acute embolism and thrombosis of unspecified deep veins of left proximal lower extremity: Secondary | ICD-10-CM

## 2024-07-22 NOTE — Telephone Encounter (Signed)
 Pt called in wanting to know if he can drop his Xarelto  down from 10 mg to 2.5 mg because it is free under his insurance plan whereas the 10 mg is $200/month. Per Dr Timmy he suggest to stay on the 10 mg dose with his history. Pt advised and would like to know if he needs to have a repeat ultrasound on his LL where the DVT was previously. Pt is also going to check with insurance/pharmacy to see if he could do 2.5 mg x 4 a day (equal 10 mg) and it still be considered free. Pt will let us  know.  Dr Timmy agreed to repeat ultrasound and will put order in. Pt advised. Still waiting to hear back about Xarelto  rx.[email or web address]

## 2024-07-23 ENCOUNTER — Other Ambulatory Visit: Payer: Self-pay | Admitting: *Deleted

## 2024-07-23 ENCOUNTER — Other Ambulatory Visit: Payer: Self-pay | Admitting: Hematology & Oncology

## 2024-07-23 MED ORDER — RIVAROXABAN 2.5 MG PO TABS: 10.0000 mg | ORAL_TABLET | Freq: Every day | ORAL | 3 refills | Status: DC

## 2024-07-26 ENCOUNTER — Other Ambulatory Visit: Payer: Self-pay | Admitting: Hematology & Oncology

## 2024-07-26 ENCOUNTER — Other Ambulatory Visit

## 2024-07-26 DIAGNOSIS — I824Y2 Acute embolism and thrombosis of unspecified deep veins of left proximal lower extremity: Secondary | ICD-10-CM

## 2024-07-27 ENCOUNTER — Ambulatory Visit: Payer: Self-pay | Admitting: Hematology & Oncology

## 2024-07-28 ENCOUNTER — Other Ambulatory Visit: Payer: Self-pay | Admitting: *Deleted

## 2024-07-28 MED ORDER — RIVAROXABAN 2.5 MG PO TABS: 10.0000 mg | ORAL_TABLET | Freq: Every day | ORAL | 3 refills | Status: DC

## 2024-08-07 ENCOUNTER — Other Ambulatory Visit: Payer: Self-pay | Admitting: Physician Assistant

## 2024-08-07 ENCOUNTER — Other Ambulatory Visit: Payer: Self-pay | Admitting: Hematology & Oncology

## 2024-08-07 DIAGNOSIS — N400 Enlarged prostate without lower urinary tract symptoms: Secondary | ICD-10-CM

## 2024-08-09 ENCOUNTER — Other Ambulatory Visit: Payer: Self-pay | Admitting: Hematology & Oncology

## 2024-08-10 ENCOUNTER — Other Ambulatory Visit: Payer: Self-pay

## 2024-08-11 ENCOUNTER — Other Ambulatory Visit: Payer: Self-pay | Admitting: Medical Genetics

## 2024-08-13 ENCOUNTER — Other Ambulatory Visit: Payer: Self-pay

## 2024-08-13 MED ORDER — RIVAROXABAN 2.5 MG PO TABS: 10.0000 mg | ORAL_TABLET | Freq: Every day | ORAL | 3 refills | Status: AC

## 2024-08-17 ENCOUNTER — Encounter: Admitting: Physician Assistant

## 2024-08-18 ENCOUNTER — Telehealth: Payer: Self-pay

## 2024-08-18 ENCOUNTER — Other Ambulatory Visit (HOSPITAL_COMMUNITY): Payer: Self-pay

## 2024-08-18 NOTE — Telephone Encounter (Signed)
 Oral Oncology Patient Advocate Encounter  Prior Authorization for rivaroxaban   has been approved.    PA# [id number] Effective dates: 07/15/2024 through 08/18/2025  Patients co-pay is $253.25   Patient has been notified via MyChart *spoke to patient via phone 08-19-24  Charlott Hamilton,  CPhT-Adv  she/her/hers [facility]  [facility] Pharmacy Technician Patient Advocate Specialist III [facility] Phone: [phone]  Fax: [phone] [email or web address]

## 2024-08-18 NOTE — Telephone Encounter (Signed)
 Oral Oncology Patient Advocate Encounter   Received notification that prior authorization for rivaroxaban   is required.   PA submitted on 08/18/24 Key [id number] Status is pending      Charlott Hamilton,  CPhT-Adv  she/her/hers [facility]  [facility] Pharmacy Technician Patient Advocate Specialist III [facility] Phone: [phone]  Fax: [phone] [email or web address]

## 2024-08-19 ENCOUNTER — Other Ambulatory Visit

## 2024-08-19 DIAGNOSIS — Z006 Encounter for examination for normal comparison and control in clinical research program: Secondary | ICD-10-CM

## 2024-08-19 LAB — GENECONNECT MOLECULAR SCREEN

## 2024-09-14 ENCOUNTER — Ambulatory Visit: Payer: No Typology Code available for payment source

## 2024-11-02 ENCOUNTER — Encounter: Admitting: Physician Assistant

## 2025-05-24 ENCOUNTER — Inpatient Hospital Stay

## 2025-05-24 ENCOUNTER — Inpatient Hospital Stay: Admitting: Hematology & Oncology
